# Patient Record
Sex: Female | Born: 1935
Health system: Southern US, Community
[De-identification: ages and names within clinical notes are randomized; demographics above are authoritative.]

## PROBLEM LIST (undated history)

## (undated) DIAGNOSIS — M1712 Unilateral primary osteoarthritis, left knee: Secondary | ICD-10-CM

## (undated) DIAGNOSIS — I10 Essential (primary) hypertension: Secondary | ICD-10-CM

## (undated) DIAGNOSIS — C50919 Malignant neoplasm of unspecified site of unspecified female breast: Secondary | ICD-10-CM

## (undated) DIAGNOSIS — M109 Gout, unspecified: Secondary | ICD-10-CM

## (undated) DIAGNOSIS — R0789 Other chest pain: Secondary | ICD-10-CM

## (undated) DIAGNOSIS — F419 Anxiety disorder, unspecified: Secondary | ICD-10-CM

## (undated) DIAGNOSIS — M1990 Unspecified osteoarthritis, unspecified site: Secondary | ICD-10-CM

## (undated) DIAGNOSIS — I639 Cerebral infarction, unspecified: Secondary | ICD-10-CM

## (undated) HISTORY — DX: Gout, unspecified: M10.9

## (undated) HISTORY — DX: Unspecified osteoarthritis, unspecified site: M19.90

## (undated) HISTORY — DX: Anxiety disorder, unspecified: F41.9

## (undated) HISTORY — PX: ABDOMINAL HYSTERECTOMY: SHX81

## (undated) HISTORY — DX: Other chest pain: R07.89

## (undated) HISTORY — DX: Essential (primary) hypertension: I10

## (undated) HISTORY — DX: Malignant neoplasm of unspecified site of unspecified female breast: C50.919

## (undated) HISTORY — PX: CHOLECYSTECTOMY: SHX55

---
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
## 2003-03-07 ENCOUNTER — Encounter: Payer: Self-pay | Admitting: Internal Medicine

## 2003-03-07 ENCOUNTER — Encounter: Admission: RE | Admit: 2003-03-07 | Discharge: 2003-03-07 | Payer: Self-pay | Admitting: Internal Medicine

## 2009-07-19 ENCOUNTER — Encounter: Admission: RE | Admit: 2009-07-19 | Discharge: 2009-07-19 | Payer: Self-pay | Admitting: Internal Medicine

## 2010-07-13 ENCOUNTER — Other Ambulatory Visit: Payer: Self-pay | Admitting: Internal Medicine

## 2010-07-13 DIAGNOSIS — Z1239 Encounter for other screening for malignant neoplasm of breast: Secondary | ICD-10-CM

## 2010-07-31 ENCOUNTER — Ambulatory Visit: Payer: Self-pay

## 2010-08-01 ENCOUNTER — Ambulatory Visit
Admission: RE | Admit: 2010-08-01 | Discharge: 2010-08-01 | Disposition: A | Discharge status: Home / Self Care | Payer: Medicare Other | Source: Ambulatory Visit | Attending: Internal Medicine | Admitting: Internal Medicine

## 2010-08-01 DIAGNOSIS — Z1239 Encounter for other screening for malignant neoplasm of breast: Secondary | ICD-10-CM

## 2011-07-12 ENCOUNTER — Other Ambulatory Visit: Payer: Self-pay | Admitting: Internal Medicine

## 2011-07-12 DIAGNOSIS — Z1231 Encounter for screening mammogram for malignant neoplasm of breast: Secondary | ICD-10-CM

## 2011-08-07 ENCOUNTER — Ambulatory Visit
Admission: RE | Admit: 2011-08-07 | Discharge: 2011-08-07 | Disposition: A | Discharge status: Home / Self Care | Payer: Medicare Other | Source: Ambulatory Visit | Attending: Internal Medicine | Admitting: Internal Medicine

## 2011-08-07 DIAGNOSIS — Z1231 Encounter for screening mammogram for malignant neoplasm of breast: Secondary | ICD-10-CM | POA: Diagnosis not present

## 2011-09-06 DIAGNOSIS — E559 Vitamin D deficiency, unspecified: Secondary | ICD-10-CM | POA: Diagnosis not present

## 2011-09-06 DIAGNOSIS — M2550 Pain in unspecified joint: Secondary | ICD-10-CM | POA: Diagnosis not present

## 2011-09-06 DIAGNOSIS — Z0000 Encounter for general adult medical examination without abnormal findings: Secondary | ICD-10-CM | POA: Diagnosis not present

## 2011-09-06 DIAGNOSIS — M545 Low back pain, unspecified: Secondary | ICD-10-CM | POA: Diagnosis not present

## 2011-09-06 DIAGNOSIS — Z23 Encounter for immunization: Secondary | ICD-10-CM | POA: Diagnosis not present

## 2011-09-06 DIAGNOSIS — J309 Allergic rhinitis, unspecified: Secondary | ICD-10-CM | POA: Diagnosis not present

## 2011-09-06 DIAGNOSIS — I10 Essential (primary) hypertension: Secondary | ICD-10-CM | POA: Diagnosis not present

## 2011-09-06 DIAGNOSIS — R252 Cramp and spasm: Secondary | ICD-10-CM | POA: Diagnosis not present

## 2012-01-17 DIAGNOSIS — H16229 Keratoconjunctivitis sicca, not specified as Sjogren's, unspecified eye: Secondary | ICD-10-CM | POA: Diagnosis not present

## 2012-01-17 DIAGNOSIS — H2510 Age-related nuclear cataract, unspecified eye: Secondary | ICD-10-CM | POA: Diagnosis not present

## 2012-02-15 ENCOUNTER — Encounter (HOSPITAL_BASED_OUTPATIENT_CLINIC_OR_DEPARTMENT_OTHER): Payer: Self-pay | Admitting: *Deleted

## 2012-02-15 ENCOUNTER — Emergency Department (HOSPITAL_BASED_OUTPATIENT_CLINIC_OR_DEPARTMENT_OTHER)
Admission: EM | Admit: 2012-02-15 | Discharge: 2012-02-16 | Disposition: A | Discharge status: Home / Self Care | Payer: Medicare Other | Attending: Emergency Medicine | Admitting: Emergency Medicine

## 2012-02-15 DIAGNOSIS — Z4801 Encounter for change or removal of surgical wound dressing: Secondary | ICD-10-CM | POA: Diagnosis not present

## 2012-02-15 DIAGNOSIS — M7989 Other specified soft tissue disorders: Secondary | ICD-10-CM | POA: Diagnosis not present

## 2012-02-15 DIAGNOSIS — M79676 Pain in unspecified toe(s): Secondary | ICD-10-CM

## 2012-02-15 DIAGNOSIS — M79609 Pain in unspecified limb: Secondary | ICD-10-CM | POA: Insufficient documentation

## 2012-02-15 DIAGNOSIS — I10 Essential (primary) hypertension: Secondary | ICD-10-CM | POA: Insufficient documentation

## 2012-02-15 DIAGNOSIS — Z8673 Personal history of transient ischemic attack (TIA), and cerebral infarction without residual deficits: Secondary | ICD-10-CM | POA: Insufficient documentation

## 2012-02-15 HISTORY — DX: Essential (primary) hypertension: I10

## 2012-02-15 HISTORY — DX: Cerebral infarction, unspecified: I63.9

## 2012-02-15 NOTE — ED Notes (Signed)
Right foot is painful and swollen. States she has been working in the yard for the past 2 days. Does not know if she got bit by an insect. Great toe is swollen, red with a large blister on this medial side of her foot.

## 2012-02-16 ENCOUNTER — Emergency Department (HOSPITAL_BASED_OUTPATIENT_CLINIC_OR_DEPARTMENT_OTHER): Payer: Medicare Other

## 2012-02-16 DIAGNOSIS — M79609 Pain in unspecified limb: Secondary | ICD-10-CM | POA: Diagnosis not present

## 2012-02-16 DIAGNOSIS — M7989 Other specified soft tissue disorders: Secondary | ICD-10-CM | POA: Diagnosis not present

## 2012-02-16 DIAGNOSIS — I10 Essential (primary) hypertension: Secondary | ICD-10-CM | POA: Diagnosis not present

## 2012-02-16 DIAGNOSIS — Z8673 Personal history of transient ischemic attack (TIA), and cerebral infarction without residual deficits: Secondary | ICD-10-CM | POA: Diagnosis not present

## 2012-02-16 LAB — CBC WITH DIFFERENTIAL/PLATELET
Basophils Absolute: 0.1 K/uL (ref 0.0–0.1)
Basophils Relative: 1 % (ref 0–1)
Eosinophils Absolute: 0.3 K/uL (ref 0.0–0.7)
Eosinophils Relative: 3 % (ref 0–5)
HCT: 39.5 % (ref 36.0–46.0)
Hemoglobin: 13.3 g/dL (ref 12.0–15.0)
Lymphocytes Relative: 28 % (ref 12–46)
Lymphs Abs: 2.6 K/uL (ref 0.7–4.0)
MCH: 30.9 pg (ref 26.0–34.0)
MCHC: 33.7 g/dL (ref 30.0–36.0)
MCV: 91.9 fL (ref 78.0–100.0)
Monocytes Absolute: 0.7 K/uL (ref 0.1–1.0)
Monocytes Relative: 8 % (ref 3–12)
Neutro Abs: 5.5 K/uL (ref 1.7–7.7)
Neutrophils Relative %: 60 % (ref 43–77)
Platelets: 300 K/uL (ref 150–400)
RBC: 4.3 MIL/uL (ref 3.87–5.11)
RDW: 12.5 % (ref 11.5–15.5)
WBC: 9.1 K/uL (ref 4.0–10.5)

## 2012-02-16 LAB — SEDIMENTATION RATE: Sed Rate: 25 mm/h — ABNORMAL HIGH (ref 0–22)

## 2012-02-16 LAB — URIC ACID: Uric Acid, Serum: 9.7 mg/dL — ABNORMAL HIGH (ref 2.4–7.0)

## 2012-02-16 LAB — BASIC METABOLIC PANEL WITH GFR
BUN: 27 mg/dL — ABNORMAL HIGH (ref 6–23)
CO2: 29 meq/L (ref 19–32)
Calcium: 10.1 mg/dL (ref 8.4–10.5)
Chloride: 98 meq/L (ref 96–112)
Creatinine, Ser: 1.2 mg/dL — ABNORMAL HIGH (ref 0.50–1.10)
GFR calc Af Amer: 51 mL/min — ABNORMAL LOW (ref >90)
GFR calc non Af Amer: 44 mL/min — ABNORMAL LOW (ref >90)
Glucose, Bld: 102 mg/dL — ABNORMAL HIGH (ref 70–99)
Potassium: 3.3 meq/L — ABNORMAL LOW (ref 3.5–5.1)
Sodium: 139 meq/L (ref 135–145)

## 2012-02-16 MED ORDER — VANCOMYCIN HCL IN DEXTROSE 1-5 GM/200ML-% IV SOLN
1000.0000 mg | Freq: Once | INTRAVENOUS | Status: AC
  Administered 2012-02-16: 1000 mg via INTRAVENOUS
  Filled 2012-02-16: qty 200

## 2012-02-16 MED ORDER — NAPROXEN 250 MG PO TABS
500.0000 mg | ORAL_TABLET | Freq: Once | ORAL | Status: AC
  Administered 2012-02-16: 500 mg via ORAL
  Filled 2012-02-16: qty 2

## 2012-02-16 MED ORDER — DOXYCYCLINE HYCLATE 100 MG PO CAPS: 100.0000 mg | ORAL_CAPSULE | Freq: Two times a day (BID) | ORAL | Status: AC

## 2012-02-16 MED ORDER — DOXYCYCLINE HYCLATE 100 MG PO TABS
100.0000 mg | ORAL_TABLET | Freq: Once | ORAL | Status: AC
  Administered 2012-02-16: 100 mg via ORAL
  Filled 2012-02-16: qty 1

## 2012-02-16 MED ORDER — SODIUM CHLORIDE 0.9 % IV SOLN
INTRAVENOUS | Status: DC
  Administered 2012-02-16: 01:00:00 via INTRAVENOUS

## 2012-02-16 MED ORDER — NAPROXEN SODIUM 220 MG PO TABS: ORAL_TABLET | ORAL | Status: DC

## 2012-02-16 MED ORDER — HYDROCODONE-ACETAMINOPHEN 5-325 MG PO TABS: 1.0000 | ORAL_TABLET | Freq: Four times a day (QID) | ORAL | Status: AC | PRN

## 2012-02-16 NOTE — ED Provider Notes (Addendum)
History     CSN: [id number]  Arrival date & time 02/15/12  2217   First MD Initiated Contact with Patient 02/16/12 0004      Chief Complaint  Patient presents with  . Foot Pain    (Consider location/radiation/quality/duration/timing/severity/associated sxs/prior treatment) HPI This is a 76 year old black female with pain, redness and swelling of the right great toe. This started yesterday morning and came on gradually. She denies injury. The pain and is in the right first  metatarsophalangeal joint and surrounding soft tissue. The swelling and erythema are on the dorsal aspect of the right first metatarsophalangeal joint and extending distally and proximally. The pain is moderate and worse with ambulation or palpation. She has no history of gout.  Past Medical History  Diagnosis Date  . Hypertension   . Stroke     Past Surgical History  Procedure Date  . Abdominal hysterectomy     No family history on file.  History  Substance Use Topics  . Smoking status: Never Smoker   . Smokeless tobacco: Not on file  . Alcohol Use: Yes    OB History    Grav Para Term Preterm Abortions TAB SAB Ect Mult Living                  Review of Systems  All other systems reviewed and are negative.    Allergies  Codeine  Home Medications   Current Outpatient Rx  Name Route Sig Dispense Refill  . ASPIRIN 81 MG PO TABS Oral Take 81 mg by mouth daily.    . ATENOLOL PO Oral Take by mouth.      BP 141/68  Pulse 63  Temp 98.6 F (37 C) (Oral)  Resp 20  SpO2 99%  Physical Exam General: Well-developed, well-nourished female in no acute distress; appearance consistent with age of record HENT: normocephalic, atraumatic Eyes: Normal appearance Neck: supple Heart: regular rate and rhythm Lungs: clear to auscultation bilaterally Abdomen: soft; nondistended Extremities: No deformity; erythema and swelling over dorsal aspect of right first metatarsophalangeal joint extending  several centimeters distally and proximally, there is associated warmth and mild to moderate tenderness with decreased range of motion of the right first toe; there is no erythema, warmth or swelling on the plantar aspect of the right first toe Neurologic: Awake, alert and oriented; motor function intact in all extremities and symmetric; no facial droop Skin: Warm and dry Psychiatric: Normal mood and affect    ED Course  Procedures (including critical care time)     MDM   Nursing notes and vitals signs, including pulse oximetry, reviewed.  Summary of this visit's results, reviewed by myself:  Labs:  Results for orders placed during the hospital encounter of 02/15/12  CBC WITH DIFFERENTIAL      Component Value Range   WBC 9.1  4.0 - 10.5 K/uL   RBC 4.30  3.87 - 5.11 MIL/uL   Hemoglobin 13.3  12.0 - 15.0 g/dL   HCT 78.2  95.6 - 21.3 %   MCV 91.9  78.0 - 100.0 fL   MCH 30.9  26.0 - 34.0 pg   MCHC 33.7  30.0 - 36.0 g/dL   RDW 08.6  57.8 - 46.9 %   Platelets 300  150 - 400 K/uL   Neutrophils Relative 60  43 - 77 %   Neutro Abs 5.5  1.7 - 7.7 K/uL   Lymphocytes Relative 28  12 - 46 %   Lymphs Abs 2.6  0.7 -  4.0 K/uL   Monocytes Relative 8  3 - 12 %   Monocytes Absolute 0.7  0.1 - 1.0 K/uL   Eosinophils Relative 3  0 - 5 %   Eosinophils Absolute 0.3  0.0 - 0.7 K/uL   Basophils Relative 1  0 - 1 %   Basophils Absolute 0.1  0.0 - 0.1 K/uL  BASIC METABOLIC PANEL      Component Value Range   Sodium 139  135 - 145 mEq/L   Potassium 3.3 (*) 3.5 - 5.1 mEq/L   Chloride 98  96 - 112 mEq/L   CO2 29  19 - 32 mEq/L   Glucose, Bld 102 (*) 70 - 99 mg/dL   BUN 27 (*) 6 - 23 mg/dL   Creatinine, Ser 9.14 (*) 0.50 - 1.10 mg/dL   Calcium 78.2  8.4 - 95.6 mg/dL   GFR calc non Af Amer 44 (*) >90 mL/min   GFR calc Af Amer 51 (*) >90 mL/min  URIC ACID      Component Value Range   Uric Acid, Serum 9.7 (*) 2.4 - 7.0 mg/dL  SEDIMENTATION RATE      Component Value Range   Sed Rate 25 (*) 0 -  22 mm/hr    Imaging Studies: Dg Toe Great Right  02/16/2012  *RADIOLOGY REPORT*  Clinical Data: Right first toe pain and swelling.  RIGHT GREAT TOE  Comparison: None  Findings: No acute fracture or dislocation.  Mild hallux valgus deformity present.  Soft tissue prominence adjacent to the MTP joint.  No foreign body.  IMPRESSION: No significant abnormalities.  Mild hallux valgus.   Original Report Authenticated By: Reola Calkins, M.D.    2:28 AM Vancomycin 1 g IV was given for possible cellulitis. The examination is also suspicious for gout especially in light of elevated uric acid. We will treat for both and have patient return in 24 hours for reevaluation. [place]; she was advised that if she does choose to leave town, which is not recommended, she had herself reexamined in emergency department there.           Hanley Seamen, MD 02/16/12 2130  Hanley Seamen, MD 02/16/12 0230

## 2012-02-17 ENCOUNTER — Encounter (HOSPITAL_BASED_OUTPATIENT_CLINIC_OR_DEPARTMENT_OTHER): Payer: Self-pay | Admitting: Emergency Medicine

## 2012-02-17 ENCOUNTER — Emergency Department (HOSPITAL_BASED_OUTPATIENT_CLINIC_OR_DEPARTMENT_OTHER)
Admission: EM | Admit: 2012-02-17 | Discharge: 2012-02-17 | Disposition: A | Discharge status: Home / Self Care | Payer: Medicare Other | Attending: Emergency Medicine | Admitting: Emergency Medicine

## 2012-02-17 DIAGNOSIS — L02619 Cutaneous abscess of unspecified foot: Secondary | ICD-10-CM | POA: Diagnosis not present

## 2012-02-17 DIAGNOSIS — I10 Essential (primary) hypertension: Secondary | ICD-10-CM | POA: Diagnosis not present

## 2012-02-17 DIAGNOSIS — L03039 Cellulitis of unspecified toe: Secondary | ICD-10-CM | POA: Insufficient documentation

## 2012-02-17 DIAGNOSIS — Z8673 Personal history of transient ischemic attack (TIA), and cerebral infarction without residual deficits: Secondary | ICD-10-CM | POA: Insufficient documentation

## 2012-02-17 DIAGNOSIS — Z4801 Encounter for change or removal of surgical wound dressing: Secondary | ICD-10-CM | POA: Diagnosis not present

## 2012-02-17 DIAGNOSIS — L0390 Cellulitis, unspecified: Secondary | ICD-10-CM

## 2012-02-17 NOTE — ED Provider Notes (Signed)
Medical screening examination/treatment/procedure(s) were performed by non-physician practitioner and as supervising physician I was immediately available for consultation/collaboration.   Jamilia Jacques, MD 02/17/12 1531 

## 2012-02-17 NOTE — ED Notes (Signed)
Pt here for recheck of toe.  Pt states it looks better and pain has improved.

## 2012-02-17 NOTE — ED Provider Notes (Signed)
History     CSN: [id number]  Arrival date & time 02/17/12  1226   First MD Initiated Contact with Patient 02/17/12 1328      Chief Complaint  Patient presents with  . Follow-up    (Consider location/radiation/quality/duration/timing/severity/associated sxs/prior treatment) Patient is a 76 y.o. female presenting with wound check. The history is provided by the patient. No language interpreter was used.  Wound Check  She was treated in the ED 2 to 3 days ago. Treatments since wound repair include oral antibiotics. The redness has improved. The swelling has improved. The pain has improved.  Pt here for recheck of cellulitis  Past Medical History  Diagnosis Date  . Hypertension   . Stroke     Past Surgical History  Procedure Date  . Abdominal hysterectomy     No family history on file.  History  Substance Use Topics  . Smoking status: Never Smoker   . Smokeless tobacco: Not on file  . Alcohol Use: Yes    OB History    Grav Para Term Preterm Abortions TAB SAB Ect Mult Living                  Review of Systems  Musculoskeletal: Negative for myalgias and joint swelling.  All other systems reviewed and are negative.    Allergies  Codeine  Home Medications   Current Outpatient Rx  Name Route Sig Dispense Refill  . ASPIRIN 81 MG PO TABS Oral Take 81 mg by mouth daily.    . ATENOLOL PO Oral Take by mouth.    . DOXYCYCLINE HYCLATE 100 MG PO CAPS Oral Take 1 capsule (100 mg total) by mouth 2 (two) times daily. 20 capsule 0  . HYDROCODONE-ACETAMINOPHEN 5-325 MG PO TABS Oral Take 1-2 tablets by mouth every 6 (six) hours as needed for pain. 20 tablet 0  . NAPROXEN SODIUM 220 MG PO TABS  Take 2 tablets every 12 hours for toe pain.      BP 105/58  Pulse 54  Temp 98.6 F (37 C) (Oral)  Resp 16  Ht 5' 1.5" (1.562 m)  Wt 159 lb (72.122 kg)  BMI 29.56 kg/m2  SpO2 99%  Physical Exam  Nursing note and vitals reviewed. Constitutional: She appears well-developed and  well-nourished.  Musculoskeletal: Normal range of motion.       No sign of infection  Neurological: She is alert.  Skin: Skin is warm.  Psychiatric: She has a normal mood and affect.    ED Course  Procedures (including critical care time)  Labs Reviewed - No data to display Dg Toe Great Right  02/16/2012  *RADIOLOGY REPORT*  Clinical Data: Right first toe pain and swelling.  RIGHT GREAT TOE  Comparison: None  Findings: No acute fracture or dislocation.  Mild hallux valgus deformity present.  Soft tissue prominence adjacent to the MTP joint.  No foreign body.  IMPRESSION: No significant abnormalities.  Mild hallux valgus.   Original Report Authenticated By: Reola Calkins, M.D.      1. Cellulitis       MDM  Pt advised finish antibiotic.         Lonia Skinner [place], [place] 02/17/12 1339

## 2012-03-13 DIAGNOSIS — N959 Unspecified menopausal and perimenopausal disorder: Secondary | ICD-10-CM | POA: Diagnosis not present

## 2012-03-13 DIAGNOSIS — M810 Age-related osteoporosis without current pathological fracture: Secondary | ICD-10-CM | POA: Diagnosis not present

## 2012-03-13 DIAGNOSIS — I10 Essential (primary) hypertension: Secondary | ICD-10-CM | POA: Diagnosis not present

## 2012-03-13 DIAGNOSIS — M25579 Pain in unspecified ankle and joints of unspecified foot: Secondary | ICD-10-CM | POA: Diagnosis not present

## 2012-03-13 DIAGNOSIS — Z79899 Other long term (current) drug therapy: Secondary | ICD-10-CM | POA: Diagnosis not present

## 2012-06-18 HISTORY — PX: BREAST LUMPECTOMY: SHX2

## 2012-06-27 DIAGNOSIS — Z79899 Other long term (current) drug therapy: Secondary | ICD-10-CM | POA: Diagnosis not present

## 2012-06-27 DIAGNOSIS — B372 Candidiasis of skin and nail: Secondary | ICD-10-CM | POA: Diagnosis not present

## 2012-06-27 DIAGNOSIS — M2550 Pain in unspecified joint: Secondary | ICD-10-CM | POA: Diagnosis not present

## 2012-07-10 ENCOUNTER — Other Ambulatory Visit: Payer: Self-pay | Admitting: Internal Medicine

## 2012-07-10 DIAGNOSIS — Z1231 Encounter for screening mammogram for malignant neoplasm of breast: Secondary | ICD-10-CM

## 2012-08-08 ENCOUNTER — Ambulatory Visit
Admission: RE | Admit: 2012-08-08 | Discharge: 2012-08-08 | Disposition: A | Discharge status: Home / Self Care | Payer: Medicare Other | Source: Ambulatory Visit | Attending: Internal Medicine | Admitting: Internal Medicine

## 2012-08-08 DIAGNOSIS — Z1231 Encounter for screening mammogram for malignant neoplasm of breast: Secondary | ICD-10-CM | POA: Diagnosis not present

## 2012-08-11 ENCOUNTER — Other Ambulatory Visit: Payer: Self-pay | Admitting: Internal Medicine

## 2012-08-11 DIAGNOSIS — R928 Other abnormal and inconclusive findings on diagnostic imaging of breast: Secondary | ICD-10-CM

## 2012-09-29 ENCOUNTER — Ambulatory Visit
Admission: RE | Admit: 2012-09-29 | Discharge: 2012-09-29 | Disposition: A | Discharge status: Home / Self Care | Payer: Medicare Other | Source: Ambulatory Visit | Attending: Internal Medicine | Admitting: Internal Medicine

## 2012-09-29 DIAGNOSIS — R928 Other abnormal and inconclusive findings on diagnostic imaging of breast: Secondary | ICD-10-CM | POA: Diagnosis not present

## 2012-10-01 DIAGNOSIS — F411 Generalized anxiety disorder: Secondary | ICD-10-CM | POA: Diagnosis not present

## 2012-10-01 DIAGNOSIS — M2550 Pain in unspecified joint: Secondary | ICD-10-CM | POA: Diagnosis not present

## 2012-10-01 DIAGNOSIS — E559 Vitamin D deficiency, unspecified: Secondary | ICD-10-CM | POA: Diagnosis not present

## 2012-10-01 DIAGNOSIS — I10 Essential (primary) hypertension: Secondary | ICD-10-CM | POA: Diagnosis not present

## 2012-10-01 DIAGNOSIS — Z79899 Other long term (current) drug therapy: Secondary | ICD-10-CM | POA: Diagnosis not present

## 2012-10-02 DIAGNOSIS — I10 Essential (primary) hypertension: Secondary | ICD-10-CM | POA: Diagnosis not present

## 2012-10-02 DIAGNOSIS — Z0000 Encounter for general adult medical examination without abnormal findings: Secondary | ICD-10-CM | POA: Diagnosis not present

## 2012-10-02 DIAGNOSIS — Z23 Encounter for immunization: Secondary | ICD-10-CM | POA: Diagnosis not present

## 2012-10-02 DIAGNOSIS — E559 Vitamin D deficiency, unspecified: Secondary | ICD-10-CM | POA: Diagnosis not present

## 2012-10-02 DIAGNOSIS — M2550 Pain in unspecified joint: Secondary | ICD-10-CM | POA: Diagnosis not present

## 2012-10-02 DIAGNOSIS — F411 Generalized anxiety disorder: Secondary | ICD-10-CM | POA: Diagnosis not present

## 2013-01-07 DIAGNOSIS — R079 Chest pain, unspecified: Secondary | ICD-10-CM | POA: Diagnosis not present

## 2013-01-07 DIAGNOSIS — N360 Urethral fistula: Secondary | ICD-10-CM | POA: Diagnosis not present

## 2013-01-07 DIAGNOSIS — N390 Urinary tract infection, site not specified: Secondary | ICD-10-CM | POA: Diagnosis not present

## 2013-01-07 DIAGNOSIS — M1990 Unspecified osteoarthritis, unspecified site: Secondary | ICD-10-CM | POA: Diagnosis not present

## 2013-02-26 ENCOUNTER — Other Ambulatory Visit: Payer: Self-pay | Admitting: Internal Medicine

## 2013-02-26 DIAGNOSIS — Z23 Encounter for immunization: Secondary | ICD-10-CM | POA: Diagnosis not present

## 2013-02-26 DIAGNOSIS — R921 Mammographic calcification found on diagnostic imaging of breast: Secondary | ICD-10-CM

## 2013-03-16 DIAGNOSIS — R059 Cough, unspecified: Secondary | ICD-10-CM | POA: Diagnosis not present

## 2013-03-16 DIAGNOSIS — R05 Cough: Secondary | ICD-10-CM | POA: Diagnosis not present

## 2013-03-16 DIAGNOSIS — J40 Bronchitis, not specified as acute or chronic: Secondary | ICD-10-CM | POA: Diagnosis not present

## 2013-03-16 DIAGNOSIS — M79609 Pain in unspecified limb: Secondary | ICD-10-CM | POA: Diagnosis not present

## 2013-03-25 DIAGNOSIS — I498 Other specified cardiac arrhythmias: Secondary | ICD-10-CM | POA: Diagnosis not present

## 2013-03-25 DIAGNOSIS — R0609 Other forms of dyspnea: Secondary | ICD-10-CM | POA: Diagnosis not present

## 2013-03-25 DIAGNOSIS — R05 Cough: Secondary | ICD-10-CM | POA: Diagnosis not present

## 2013-03-25 DIAGNOSIS — R059 Cough, unspecified: Secondary | ICD-10-CM | POA: Diagnosis not present

## 2013-03-25 DIAGNOSIS — R42 Dizziness and giddiness: Secondary | ICD-10-CM | POA: Diagnosis not present

## 2013-03-25 DIAGNOSIS — Z79899 Other long term (current) drug therapy: Secondary | ICD-10-CM | POA: Diagnosis not present

## 2013-03-31 DIAGNOSIS — M109 Gout, unspecified: Secondary | ICD-10-CM | POA: Diagnosis not present

## 2013-03-31 DIAGNOSIS — I10 Essential (primary) hypertension: Secondary | ICD-10-CM | POA: Diagnosis not present

## 2013-03-31 DIAGNOSIS — Z79899 Other long term (current) drug therapy: Secondary | ICD-10-CM | POA: Diagnosis not present

## 2013-04-01 ENCOUNTER — Other Ambulatory Visit: Payer: Self-pay | Admitting: Internal Medicine

## 2013-04-01 ENCOUNTER — Ambulatory Visit
Admission: RE | Admit: 2013-04-01 | Discharge: 2013-04-01 | Disposition: A | Discharge status: Home / Self Care | Payer: BC Managed Care – PPO | Source: Ambulatory Visit | Attending: Internal Medicine | Admitting: Internal Medicine

## 2013-04-01 DIAGNOSIS — R921 Mammographic calcification found on diagnostic imaging of breast: Secondary | ICD-10-CM

## 2013-04-01 DIAGNOSIS — R928 Other abnormal and inconclusive findings on diagnostic imaging of breast: Secondary | ICD-10-CM | POA: Diagnosis not present

## 2013-04-09 ENCOUNTER — Ambulatory Visit
Admission: RE | Admit: 2013-04-09 | Discharge: 2013-04-09 | Disposition: A | Discharge status: Home / Self Care | Payer: BC Managed Care – PPO | Source: Ambulatory Visit | Attending: Internal Medicine | Admitting: Internal Medicine

## 2013-04-09 ENCOUNTER — Other Ambulatory Visit: Payer: Self-pay | Admitting: Diagnostic Radiology

## 2013-04-09 DIAGNOSIS — R928 Other abnormal and inconclusive findings on diagnostic imaging of breast: Secondary | ICD-10-CM | POA: Diagnosis not present

## 2013-04-09 DIAGNOSIS — R921 Mammographic calcification found on diagnostic imaging of breast: Secondary | ICD-10-CM

## 2013-04-09 DIAGNOSIS — D0590 Unspecified type of carcinoma in situ of unspecified breast: Secondary | ICD-10-CM | POA: Diagnosis not present

## 2013-04-14 ENCOUNTER — Other Ambulatory Visit: Payer: Self-pay | Admitting: Internal Medicine

## 2013-04-14 DIAGNOSIS — R921 Mammographic calcification found on diagnostic imaging of breast: Secondary | ICD-10-CM

## 2013-04-14 DIAGNOSIS — D0511 Intraductal carcinoma in situ of right breast: Secondary | ICD-10-CM

## 2013-04-16 ENCOUNTER — Telehealth: Payer: Self-pay | Admitting: *Deleted

## 2013-04-16 ENCOUNTER — Ambulatory Visit
Admission: RE | Admit: 2013-04-16 | Discharge: 2013-04-16 | Disposition: A | Discharge status: Home / Self Care | Payer: Medicare Other | Source: Ambulatory Visit | Attending: Internal Medicine | Admitting: Internal Medicine

## 2013-04-16 DIAGNOSIS — R921 Mammographic calcification found on diagnostic imaging of breast: Secondary | ICD-10-CM

## 2013-04-16 DIAGNOSIS — C50211 Malignant neoplasm of upper-inner quadrant of right female breast: Secondary | ICD-10-CM | POA: Insufficient documentation

## 2013-04-16 NOTE — Telephone Encounter (Signed)
Confirmed [facility] for 04/22/13 at 1200.  Instructions and contact information given.

## 2013-04-17 ENCOUNTER — Ambulatory Visit
Admission: RE | Admit: 2013-04-17 | Discharge: 2013-04-17 | Disposition: A | Discharge status: Home / Self Care | Payer: Medicare Other | Source: Ambulatory Visit | Attending: Internal Medicine | Admitting: Internal Medicine

## 2013-04-17 DIAGNOSIS — C50919 Malignant neoplasm of unspecified site of unspecified female breast: Secondary | ICD-10-CM | POA: Diagnosis not present

## 2013-04-17 DIAGNOSIS — D0511 Intraductal carcinoma in situ of right breast: Secondary | ICD-10-CM

## 2013-04-17 MED ORDER — GADOBENATE DIMEGLUMINE 529 MG/ML IV SOLN
15.0000 mL | Freq: Once | INTRAVENOUS | Status: AC | PRN
  Administered 2013-04-17: 15 mL via INTRAVENOUS

## 2013-04-20 ENCOUNTER — Other Ambulatory Visit: Payer: Self-pay | Admitting: Internal Medicine

## 2013-04-20 DIAGNOSIS — R928 Other abnormal and inconclusive findings on diagnostic imaging of breast: Secondary | ICD-10-CM

## 2013-04-22 ENCOUNTER — Ambulatory Visit (HOSPITAL_BASED_OUTPATIENT_CLINIC_OR_DEPARTMENT_OTHER): Payer: Medicare Other | Admitting: Oncology

## 2013-04-22 ENCOUNTER — Ambulatory Visit (HOSPITAL_BASED_OUTPATIENT_CLINIC_OR_DEPARTMENT_OTHER): Payer: Medicare Other | Admitting: General Surgery

## 2013-04-22 ENCOUNTER — Ambulatory Visit: Payer: Medicare Other

## 2013-04-22 ENCOUNTER — Ambulatory Visit: Payer: Medicare Other | Admitting: Physical Therapy

## 2013-04-22 ENCOUNTER — Other Ambulatory Visit (HOSPITAL_BASED_OUTPATIENT_CLINIC_OR_DEPARTMENT_OTHER): Payer: Medicare Other | Admitting: Lab

## 2013-04-22 ENCOUNTER — Encounter: Payer: Self-pay | Admitting: *Deleted

## 2013-04-22 ENCOUNTER — Encounter: Payer: Self-pay | Admitting: Oncology

## 2013-04-22 ENCOUNTER — Ambulatory Visit
Admission: RE | Admit: 2013-04-22 | Discharge: 2013-04-22 | Disposition: A | Discharge status: Home / Self Care | Payer: Medicare Other | Source: Ambulatory Visit | Attending: Radiation Oncology | Admitting: Radiation Oncology

## 2013-04-22 VITALS — BP 157/71 | HR 55 | Temp 98.7°F | Resp 18 | Ht 61.5 in | Wt 183.2 lb

## 2013-04-22 DIAGNOSIS — C50219 Malignant neoplasm of upper-inner quadrant of unspecified female breast: Secondary | ICD-10-CM

## 2013-04-22 DIAGNOSIS — C50211 Malignant neoplasm of upper-inner quadrant of right female breast: Secondary | ICD-10-CM

## 2013-04-22 DIAGNOSIS — R160 Hepatomegaly, not elsewhere classified: Secondary | ICD-10-CM

## 2013-04-22 DIAGNOSIS — K769 Liver disease, unspecified: Secondary | ICD-10-CM | POA: Diagnosis not present

## 2013-04-22 LAB — CBC WITH DIFFERENTIAL/PLATELET
BASO%: 1.9 % (ref 0.0–2.0)
Basophils Absolute: 0.1 10*3/uL (ref 0.0–0.1)
EOS%: 2.5 % (ref 0.0–7.0)
Eosinophils Absolute: 0.2 10*3/uL (ref 0.0–0.5)
HCT: 35.9 % (ref 34.8–46.6)
HGB: 12 g/dL (ref 11.6–15.9)
MCH: 31 pg (ref 25.1–34.0)
MCHC: 33.5 g/dL (ref 31.5–36.0)
MCV: 92.6 fL (ref 79.5–101.0)
MONO#: 0.7 10*3/uL (ref 0.1–0.9)
MONO%: 9.7 % (ref 0.0–14.0)
NEUT%: 48.9 % (ref 38.4–76.8)
RDW: 13.6 % (ref 11.2–14.5)
WBC: 7 10*3/uL (ref 3.9–10.3)
lymph#: 2.6 10*3/uL (ref 0.9–3.3)

## 2013-04-22 LAB — COMPREHENSIVE METABOLIC PANEL (CC13)
ALT: 31 U/L (ref 0–55)
Anion Gap: 9 mEq/L (ref 3–11)
BUN: 28.1 mg/dL — ABNORMAL HIGH (ref 7.0–26.0)
CO2: 28 mEq/L (ref 22–29)
Creatinine: 1.2 mg/dL — ABNORMAL HIGH (ref 0.6–1.1)
Glucose: 88 mg/dl (ref 70–140)
Potassium: 4.3 mEq/L (ref 3.5–5.1)
Total Bilirubin: 0.47 mg/dL (ref 0.20–1.20)

## 2013-04-22 NOTE — Assessment & Plan Note (Addendum)
Because of the second area of concern found on the MRI, she is scheduled for an MR guided biopsy on November 12. However, even if this is positive for cancer, I think that she is a candidate for breast conservation due to the proximity of the lesions to one another.  I would not perform a lymph node biopsy on her and when she was found to have invasive cancer.  She also will need a needle localized excisional biopsy on the left. She had suspicious calcifications but these are unable to be biopsied due to the proximity of the calcifications to the skin. These are in the subareolar location.  I will go ahead and get the patient on the schedule for surgery. She wants to have this after Thanksgiving. I advised the patient that if we find out that she'll need a sentinel lymph node biopsy, I can see her back in the office to discuss this further.  Otherwise, our plan will be to do a right needle localized partial mastectomy and a left needle localized excisional breast biopsy. We did talk about the possibility of not performing surgery and not treating this. Advised patient that I would not elect to pursue this plan since she is a relatively healthy female and has a natural history of having a good life expectancy. I discussed that if it were to be left untreated, it will likely grow significantly and require more extensive surgery and/or chemotherapy or radiation to treat.  The surgical procedure was described to the patient.  I discussed the incision type and location and that we would need radiology involved on the day of surgery with a wire marker and possibly a sentinel node injection.  The risks and benefits of the procedure were described to the patient and he/she wishes to proceed.    We discussed the risks bleeding, infection, damage to other structures, need for further procedures/surgeries.  We discussed the risk of seroma.  The patient was advised if the area in the breast is invasive cancer, we  may need to go back to surgery for additional tissue to obtain negative margins or for a lymph node biopsy. The patient was advised that these are the most common complications, but that others can occur as well.  They were advised against taking aspirin or other anti-inflammatory agents/blood thinners the week before surgery.    45 min spent in evaluation, examination, counseling, and coordination of care.  >50% spent in counseling.

## 2013-04-22 NOTE — Progress Notes (Signed)
Checked in new patient with no financial issues. She has not been to [place] and I have her appt card and [facility] Form.

## 2013-04-22 NOTE — Progress Notes (Signed)
Chief complaint:  New right breast DCIS.    HISTORY: Is a 77 year old female who is referred by Dr. Renae Gloss for evaluation of her new right breast cancer. These were six-month followup for calcifications. The calcifications did change significantly and so she underwent diagnostic mammography and biopsy. 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  She subsequently underwent MRI and was found to have a second area that was 2.5 cm away that was concerning. This has not been biopsied yet.  She does not have any family history of breast cancer. She and her call when she menarche. She is not vomiting. Since she had her hysterectomy. She did have 5 children that she carried to term. She has not used hormone replacement were hormonal contraception. She has had a colonoscopy and bone density scan.  Past Medical History  Diagnosis Date  . Hypertension   . Stroke   . Breast cancer   . Anxiety   . Arthritis   . Gout     Past Surgical History  Procedure Laterality Date  . Abdominal hysterectomy    . Cholecystectomy      Current Outpatient Prescriptions  Medication Sig Dispense Refill  . aspirin 81 MG tablet Take 81 mg by mouth daily.      . naproxen sodium (ALEVE) 220 MG tablet Take 2 tablets every 12 hours for toe pain.       No current facility-administered medications for this visit.     Allergies  Allergen Reactions  . Codeine Nausea Only     No pertinent family history.     History   Social History  . Marital Status: Divorced    Spouse Name: N/A    Number of Children: N/A  . Years of Education: N/A   Social History Main Topics  . Smoking status: Never Smoker   . Smokeless tobacco: Not on file  . Alcohol Use: Yes  . Drug Use: No  . Sexual Activity: Not on file   Other Topics Concern  . Not on file   Social History Narrative  . Daughter is [employer] nurse.       REVIEW OF SYSTEMS - PERTINENT POSITIVES ONLY: 12 point review of systems negative  other than HPI and PMH except for sinus issues, dentures, history of ulcer, back pain, arthritis, gout, anxiety  EXAM:  Wt Readings from Last 3 Encounters:  04/22/13 183 lb 3.2 oz (83.099 kg)  02/17/12 159 lb (72.122 kg)   Temp Readings from Last 3 Encounters:  04/22/13 98.7 F (37.1 C) Oral  02/17/12 98.6 F (37 C) Oral  02/15/12 98.6 F (37 C) Oral   BP Readings from Last 3 Encounters:  04/22/13 157/71  02/17/12 105/58  02/16/12 142/59   Pulse Readings from Last 3 Encounters:  04/22/13 55  02/17/12 54  02/15/12 63     Gen:  No acute distress.  Well nourished and well groomed.   Neurological: Alert and oriented to person, place, and time. Coordination normal.  Head: Normocephalic and atraumatic.  Eyes: Conjunctivae are normal. Pupils are equal, round, and reactive to light. No scleral icterus.  Neck: Normal range of motion. Neck supple. No tracheal deviation or thyromegaly present.  Cardiovascular: Normal rate, regular rhythm, normal heart sounds and intact distal pulses.  Exam reveals no gallop and no friction rub.  No murmur heard. Respiratory: Effort normal.  No respiratory distress. No chest wall tenderness. Breath sounds normal.  No wheezes, rales or rhonchi.  Breast:  No palpable mass in either breast.  No nipple retraction or skin dimpling.  There is no nipple discharge, no lymphadenopathy.   GI: Soft. Bowel sounds are normal. The abdomen is soft and nontender.  There is no rebound and no guarding.  Musculoskeletal: Normal range of motion. Extremities are nontender.  Lymphadenopathy: No cervical, preauricular, postauricular or axillary adenopathy is present Skin: Skin is warm and dry. No rash noted. No diaphoresis. No erythema. No pallor. No clubbing, cyanosis, or edema.   Psychiatric: Normal mood and affect. Behavior is normal. Judgment and thought content normal.    LABORATORY RESULTS: Available labs are reviewed  Pathology Diagnosis Breast, right, needle core  biopsy, 1 o'clock - DUCTAL CARCINOMA IN SITU SEE COMMENT.Marland Kitchen ER/PR +  RADIOLOGY RESULTS: See E-Chart or I-Site for most recent results.  Images and reports are reviewed.  MR IMPRESSION:  1. Post biopsy changes in the upper inner quadrant of the right  breast.  2. 1.2 cm area of clumped linear enhancement in the upper inner  right breast, 2.5 cm anterior and inferior to the biopsy marker  clip. This is suspicious for a 2nd focus of ductal carcinoma in  situ. If breast conservation is desired, MRI guided core needle  biopsy of this area would be recommended.  3. No evidence of malignancy on the left and no adenopathy.  Mammo FINDINGS:  Routine a magnification views of both breasts are performed.  A 5 x 5 mm group of heterogeneous calcifications in the upper right  breast (middle 3rd) have increased in number, size and  heterogeneity. Slight increased density is associated with these  calcifications.  Other calcifications scattered within both breasts are unchanged.  Scarring within the right breast is again noted.  No other suspicious findings are identified.  Mammographic images were processed with CAD.  IMPRESSION:  Developing indeterminate calcifications within the upper right  breast -tissue sampling is recommended.  Other stable likely benign bilateral breast calcifications.  Six-month followup is recommended to ensure stability.    ASSESSMENT AND PLAN: Breast cancer of upper-inner quadrant of right female breast Because of the second area of concern found on the MRI, she is scheduled for an MR guided biopsy on November 12. However, even if this is positive for cancer, I think that she is a candidate for breast conservation due to the proximity of the lesions to one another.  I would not perform a lymph node biopsy on her and when she was found to have invasive cancer.  She also will need a needle localized excisional biopsy on the left. She had suspicious calcifications  but these are unable to be biopsied due to the proximity of the calcifications to the skin. These are in the subareolar location.  I will go ahead and get the patient on the schedule for surgery. She wants to have this after Thanksgiving. I advised the patient that if we find out that she'll need a sentinel lymph node biopsy, I can see her back in the office to discuss this further.  Otherwise, our plan will be to do a right needle localized partial mastectomy and a left needle localized excisional breast biopsy. We did talk about the possibility of not performing surgery and not treating this. Advised patient that I would not elect to pursue this plan since she is a relatively healthy female and has a natural history of having a good life expectancy. I discussed that if it were to be left untreated, it will likely grow significantly and  require more extensive surgery and/or chemotherapy or radiation to treat.  The surgical procedure was described to the patient.  I discussed the incision type and location and that we would need radiology involved on the day of surgery with a wire marker and possibly a sentinel node injection.  The risks and benefits of the procedure were described to the patient and he/she wishes to proceed.    We discussed the risks bleeding, infection, damage to other structures, need for further procedures/surgeries.  We discussed the risk of seroma.  The patient was advised if the area in the breast is invasive cancer, we may need to go back to surgery for additional tissue to obtain negative margins or for a lymph node biopsy. The patient was advised that these are the most common complications, but that others can occur as well.  They were advised against taking aspirin or other anti-inflammatory agents/blood thinners the week before surgery.    45 min spent in evaluation, examination, counseling, and coordination of care.  >50% spent in counseling.        Casey Diego  MD Surgical Oncology, General and Endocrine Surgery [facility]      Visit Diagnoses: 1. Mass of multiple sites of liver   2. Breast cancer of upper-inner quadrant of right female breast     Primary Care Physician: Alva Garnet., MD  [facility]  [facility]  Marianne Sofia, RN

## 2013-04-22 NOTE — Progress Notes (Signed)
[facility] NEW PATIENT EVALUATION  Name: Casey [facility] MRN: [id number]  Date:   04/22/2013           DOB: June 07, 1936  Status: outpatient   CC: Alva Garnet., MD  Almond Lint, MD    REFERRING PHYSICIAN: Almond Lint, MD   DIAGNOSIS: Stage 0 (Tis N0 M0) DCIS of the right breast   HISTORY OF PRESENT ILLNESS:  Casey Taylor is a 77 y.o. female who is seen today at the [facility] for the courtesy of Dr. Donell Beers for consideration of radiation therapy in the management of her DCIS of the right breast. At the time of a screening mammogram on 04/01/2013 she was noted to have a 0.5 x 0.5 cm area of calcifications within the upper right breast which had increased in number, size and heterogeneity. Additional views showed these to be at approximately 1:00 within the right breast. She was also found to have suspicious calcifications in the left subareolar location which were too superficial for a biopsy and surgical excision is recommended. Biopsy of the right breast on 04/09/2013 was diagnostic for DCIS, felt to be grade II. This was ER positive at 100% and PR positive at 86%. Breast MR on 04/17/2013 showed the biopsy site within the upper inner quadrant of the right breast, but also a separate area of enhancement measuring 1.2 cm  2.5 cm anterior and inferior to the biopsy marker clip. She is scheduled for a MRI guided biopsy of this on November 12. The left breast appear to be normal on MRI.  PREVIOUS RADIATION THERAPY: No   PAST MEDICAL HISTORY:  has a past medical history of Hypertension; Stroke; Breast cancer; Anxiety; Arthritis; and Gout.     PAST SURGICAL HISTORY:  Past Surgical History  Procedure Laterality Date  . Abdominal hysterectomy    . Cholecystectomy       FAMILY HISTORY: family history is not on file. No family history of cancer.   SOCIAL HISTORY:  reports that she has never smoked. She does not have any smokeless tobacco  history on file. She reports that she drinks alcohol. She reports that she does not use illicit drugs. Divorced, 5 children. She worked as a Chartered certified accountant.   ALLERGIES: Codeine   MEDICATIONS:  Current Outpatient Prescriptions  Medication Sig Dispense Refill  . aspirin 81 MG tablet Take 81 mg by mouth daily.      . naproxen sodium (ALEVE) 220 MG tablet Take 2 tablets every 12 hours for toe pain.       No current facility-administered medications for this encounter.     REVIEW OF SYSTEMS:  Pertinent items are noted in HPI.    PHYSICAL EXAM: Alert and oriented 77 year old African American female appearing younger than her stated age. Wt Readings from Last 3 Encounters:  04/22/13 183 lb 3.2 oz (83.099 kg)  02/17/12 159 lb (72.122 kg)   Temp Readings from Last 3 Encounters:  04/22/13 98.7 F (37.1 C) Oral  02/17/12 98.6 F (37 C) Oral  02/15/12 98.6 F (37 C) Oral   BP Readings from Last 3 Encounters:  04/22/13 157/71  02/17/12 105/58  02/16/12 142/59   Pulse Readings from Last 3 Encounters:  04/22/13 55  02/17/12 54  02/15/12 63   Head and neck examination: Grossly unremarkable. Nodes: Without palpable cervical, supraclavicular, or axillary lymphadenopathy. Chest: Lungs clear. Back: Without spinal or CVA tenderness. Heart: Regular rate and rhythm. Breasts: There is a biopsy wound along  the upper-outer quadrant of the right breast at 1:00. No masses are appreciated. Left breast also without masses or lesions. There is no left nipple discharge. Abdomen: Without hepatomegaly. Extremities: Without edema.    LABORATORY DATA:  Lab Results  Component Value Date   WBC 7.0 04/22/2013   HGB 12.0 04/22/2013   HCT 35.9 04/22/2013   MCV 92.6 04/22/2013   PLT 344 04/22/2013   Lab Results  Component Value Date   NA 141 04/22/2013   K 4.3 04/22/2013   CL 98 02/16/2012   CO2 28 04/22/2013   Lab Results  Component Value Date   ALT 31 04/22/2013   AST 26 04/22/2013    ALKPHOS 117 04/22/2013   BILITOT 0.47 04/22/2013      IMPRESSION: Stage 0 (Tis N0 M0) DCIS of the right breast. Next week she will have the MRI guided biopsy of the separate lesion seen within the right breast. In any case, she appears to be a candidate for breast preservation. Depending on her pathologic findings she may or may not require adjuvant radiation therapy. Adjuvant radiation therapy for DCIS is based on size, nuclear grade, surgical margins, and age. She may be a candidate for adjuvant hormone therapy as well or in place of radiation therapy. With respect to her left-sided calcifications, Dr. Donell Beers will perform an excisional biopsy. If this represents DCIS or invasive disease, she may be a candidate for breast preservation on the left as well. She appears to be a good performance status, and may have a 10 year life expectancy. She will be represented at the Wednesday morning conference following her surgery.   PLAN: As discussed above.  I spent 30 minutes minutes face to face with the patient and more than 50% of that time was spent in counseling and/or coordination of care.

## 2013-04-23 ENCOUNTER — Encounter: Payer: Self-pay | Admitting: Oncology

## 2013-04-23 NOTE — Progress Notes (Signed)
Casey Taylor [id number] April 10, 1936 77 y.o. 04/23/2013 5:40 PM  CC  Alva Garnet., MD [address] [place] [address]  Dr. Chipper Herb Dr. Everardo Beals REASON FOR CONSULTATION:  77 year old female with new diagnosis of right breast cancer. Patient is seen in the multidisciplinary breast clinic for discussion of treatment options  STAGE:   Breast cancer of upper-inner quadrant of right female breast   Primary site: Breast (Right)   Staging method: AJCC 7th Edition   Clinical: Stage 0 (Tis (DCIS), N0, cM0)   Summary: Stage 0 (Tis (DCIS), N0, cM0)  REFERRING PHYSICIAN: Dr. Everardo Beals  HISTORY OF PRESENT ILLNESS:  Casey Taylor is a 77 y.o. female.  Has been having screening mammograms on regular basis. She had a six-month followup mammogram that showed bilateral calcifications. On the right side she was noted to have 5 x 5 mm right calcifications. She was also had left calcifications suspicious but too close to the skin to biopsy. She underwent MRI of the breasts. She was noted to have biopsy changes as well as a 1.2 cm area of concern that was 2.5 cm anterior and inferior to the biopsy clip. On the left no lesions were seen. But it was felt that these were subareolar. 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 Patient was recommended to undergo biopsy of the second lesion in the right breast. This biopsy is scheduled on 04/29/2013. Patient's case was discussed at the multidisciplinary breast conference. Her radiology and pathology were reviewed. She is without any complaints related to her breast cancer.   Past Medical History: Past Medical History  Diagnosis Date  . Hypertension   . Stroke   . Breast cancer   . Anxiety   . Arthritis   . Gout     Past Surgical History: Past Surgical History  Procedure Laterality Date  . Abdominal hysterectomy    . Cholecystectomy      Family History: History  reviewed. No pertinent family history.  Social History History  Substance Use Topics  . Smoking status: Never Smoker   . Smokeless tobacco: Not on file  . Alcohol Use: Yes    Allergies: Allergies  Allergen Reactions  . Codeine Nausea Only    Current Medications: Current Outpatient Prescriptions  Medication Sig Dispense Refill  . aspirin 81 MG tablet Take 81 mg by mouth daily.      Marland Kitchen atenolol-chlorthalidone (TENORETIC) 50-25 MG per tablet Take 1 tablet by mouth daily.      . Cholecalciferol (VITAMIN D PO) Take 2,000 Int'l Units by mouth daily.      . colchicine 0.6 MG tablet Take 0.6 mg by mouth daily.      . Magnesium 250 MG TABS Take 1 tablet by mouth daily.      . MELOXICAM PO Take 1 capsule by mouth daily.      . naproxen sodium (ALEVE) 220 MG tablet Take 2 tablets every 12 hours for toe pain.       No current facility-administered medications for this visit.    OB/GYN History: Menarche at around 46 she is postmenopausal she said 5 live births. She has never been on hormone replacement therapy.  Fertility Discussion: Not applicable Prior History of Cancer: No  Health Maintenance:  Colonoscopy yes yes Bone Density yes Last PAP smear unknown  ECOG PERFORMANCE STATUS: 0 - Asymptomatic  Genetic Counseling/testing: No  REVIEW OF SYSTEMS:  Patient does have history of arthritis  with joint pain and back pain as well as anxiety. She does have some sinus issues that are ongoing. She also has history of ulcers. Remainder of the 14 point review of systems is negative and scans separately into the electronic medical record  PHYSICAL EXAMINATION: Blood pressure 157/71, pulse 55, temperature 98.7 F (37.1 C), temperature source Oral, resp. rate 18, height 5' 1.5" (1.562 m), weight 183 lb 3.2 oz (83.099 kg).  ZOX:WRUEA, healthy, no distress, well nourished and well developed SKIN: skin color, texture, turgor are normal HEAD: Normocephalic EYES: PERRLA, EOMI, Conjunctiva  are pink and non-injected EARS: External ears normal OROPHARYNX:no exudate, no erythema and lips, buccal mucosa, and tongue normal  NECK: no adenopathy LYMPH:  no palpable lymphadenopathy, no hepatosplenomegaly BREAST:left breast normal without mass, skin or nipple changes or axillary nodes, abnormal mass palpable in the right breast from the biopsy otherwise no masses or nipple discharge LUNGS: clear to auscultation and percussion HEART: regular rate & rhythm ABDOMEN:abdomen soft, non-tender, normal bowel sounds and no masses or organomegaly BACK: Back symmetric, no curvature., No CVA tenderness EXTREMITIES:no edema, no clubbing, no cyanosis  NEURO: alert & oriented x 3 with fluent speech, no focal motor/sensory deficits, gait normal, reflexes normal and symmetric     STUDIES/RESULTS: Mr Breast Bilateral W Wo Contrast  04/17/2013   CLINICAL DATA:  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  EXAM: MR BILATERAL BREAST WITHOUT AND WITH CONTRAST  LABS:  BUN and creatinine were obtained on site at [facility] at  [address]  Results:  BUN 24 mg/dL,  Creatinine 1.2 mg/dL.  TECHNIQUE: Multiplanar, multisequence MR images of both breasts were obtained prior to and following the intravenous administration of 15ml of MultiHance.  THREE-DIMENSIONAL MR IMAGE RENDERING ON INDEPENDENT WORKSTATION:  Three-dimensional MR images were rendered by post-processing of the original MR data on an independent workstation. The three-dimensional MR images were interpreted, and findings are reported in the following complete MRI report for this study.  COMPARISON:  Previous exams, including the recent mammograms and stereotactic guided core needle biopsy.  FINDINGS: Breast composition: b. Scattered fibroglandular tissue  Background parenchymal enhancement: Mild, nodular.  Right breast: Post biopsy tract and biopsy marker clip artifact in the upper inner quadrant of the right breast,  corresponding to the location of the recently biopsied ductal carcinoma in situ. 2.5 cm anterior and inferior to the biopsy marker clip, there is 1.2 x 0.6 x 0.5 cm area of clumped linear enhancement.  Left breast: No mass or abnormal enhancement.  Lymph nodes: No abnormal appearing lymph nodes.  Ancillary findings:  None.  IMPRESSION: 1. Post biopsy changes in the upper inner quadrant of the right breast. 2. 1.2 cm area of clumped linear enhancement in the upper inner right breast, 2.5 cm anterior and inferior to the biopsy marker clip. This is suspicious for a 2nd focus of ductal carcinoma in situ. If breast conservation is desired, MRI guided core needle biopsy of this area would be recommended. 3. No evidence of malignancy on the left and no adenopathy.  RECOMMENDATION: Right breast MR guided core needle biopsy. This will be scheduled.  BI-RADS CATEGORY  4: Suspicious abnormality - biopsy should be considered.   Electronically Signed   By: Gordan Payment M.D.   On: 04/17/2013 16:29   Mm Digital Diagnostic Bilat  04/01/2013   CLINICAL DATA:  77 year old female - followup bilateral breast calcifications  EXAM: DIGITAL DIAGNOSTIC  BILATERAL MAMMOGRAM with CAD  COMPARISON:  09/29/2012  and prior mammograms dating back to 07/19/2009  ACR Breast Density Category d: The breasts are extremely dense, which lowers the sensitivity of mammography.  FINDINGS: Routine a magnification views of both breasts are performed.  A 5 x 5 mm group of heterogeneous calcifications in the upper right breast (middle 3rd) have increased in number, size and heterogeneity. Slight increased density is associated with these calcifications.  Other calcifications scattered within both breasts are unchanged.  Scarring within the right breast is again noted.  No other suspicious findings are identified.  Mammographic images were processed with CAD.  IMPRESSION: Developing indeterminate calcifications within the upper right breast -tissue sampling  is recommended.  Other stable likely benign bilateral breast calcifications. Six-month followup is recommended to ensure stability.  RECOMMENDATION: Stereotactic guided right breast biopsy. These results were discussed with the patient and she agrees to proceed with stereotactic guided biopsy. As she would like to have her daughter present at her biopsy appointment, she will contact us to schedule the stereotactic biopsy after she checks her daughter's schedule.  Bilateral diagnostic mammograms with magnification views in 6 months to follow-up other likely benign breast calcifications.  I have discussed the findings and recommendations with the patient. Results were also provided in writing at the conclusion of the visit.  BI-RADS CATEGORY  4: Suspicious abnormality - biopsy should be considered.   Electronically Signed   By: Laveda Abbe M.D.   On: 04/01/2013 11:39   Mm Rt Breast Bx W Loc Dev 1st Lesion Image Bx Spec Stereo Guide  04/17/2013   ADDENDUM REPORT: 04/17/2013 18:25  ADDENDUM: Scheduled stereotactic biopsy of left subaerolar calcifications could not be performed due to nipple retraction and insufficient tissue thickness at the anticipated biopsy site both in the CC and ML planes. I discussed the need for tissue sampling with needle localization and surgical biopsy if MRI scheduled for 04/17/2013 does not show evidence for other suspicious sites in the left breast ammenable to MRI guided biopsy initially. The patient understood this recommendation.   Electronically Signed   By: Leda Gauze M.D.   On: 04/17/2013 18:25   04/17/2013   ADDENDUM REPORT: 04/17/2013 18:23  ADDENDUM: Scheduled stereotactic biopsy of left subaerolar calcifications could not be performed due to nipple retraction and insufficient tissue thickness at the anticipated biopsy site both in the CC and ML planes. I discussed the need for tissue sampling with needle localization and surgical biopsy if MRI scheduled for 04/17/2013 does  not show evidence for other suspicious sites in the left breast ammenable to MRI guided biopsy initially. The patient understood this recommendation.   Electronically Signed   By: Leda Gauze M.D.   On: 04/17/2013 18:23   04/15/2013   ADDENDUM REPORT: 04/15/2013 10:58  ADDENDUM: Ductal carcinoma in situ was reported histologically from the calcifications biopsied in the right breast. This is concordant with the imaging findings. MRI has been scheduled on 04/17/2013. The patient will be seen at the Multidisciplinary Clinic on 04/22/2013. The patient was contacted and given the results of the biopsy. She states the biopsy site is clean and dry with no hematoma or signs of infection. Stereotactic core biopsy of calcifications in the left subareolar region is recommended. This has been scheduled for 04/16/2013.   Electronically Signed   By: Baird Lyons M.D.   On: 04/15/2013 10:58   04/17/2013   CLINICAL DATA:  Suspicious right breast calcifications.  EXAM: STEREOTACTIC VACUUM ASSIST RIGHT  COMPARISON:  Previous exams.  FINDINGS: I met  with the patient and we discussed the procedure of stereotactic-guided biopsy, including benefits and alternatives. We discussed the high likelihood of a successful procedure. We discussed the risks of the procedure, including infection, bleeding, tissue injury, clip migration, and inadequate sampling. Informed, written consent was given.  Using sterile technique and 2% Lidocaine as local anesthetic, under stereotactic guidance, a 9 gauge vacuum assisted device was used to perform core needle biopsy of calcifications within the right breast 1 o'clock position middle depth using a superior approach. Specimen radiograph was performed, showing calcifications. Specimens with calcifications are identified for pathology.  At the conclusion of the procedure, a top hat shaped tissue marker clip was deployed into the biopsy cavity. Follow-up 2-view mammogram confirmed clip in appropriate  position.  IMPRESSION: Stereotactic-guided biopsy of right breast calcifications. No apparent complications.  Electronically Signed: By: Annia Belt M.D. On: 04/09/2013 11:50     LABS:    Chemistry      Component Value Date/Time   NA 141 04/22/2013 1220   NA 139 02/16/2012 0032   K 4.3 04/22/2013 1220   K 3.3* 02/16/2012 0032   CL 98 02/16/2012 0032   CO2 28 04/22/2013 1220   CO2 29 02/16/2012 0032   BUN 28.1* 04/22/2013 1220   BUN 27* 02/16/2012 0032   CREATININE 1.2* 04/22/2013 1220   CREATININE 1.20* 02/16/2012 0032      Component Value Date/Time   CALCIUM 10.4 04/22/2013 1220   CALCIUM 10.1 02/16/2012 0032   ALKPHOS 117 04/22/2013 1220   AST 26 04/22/2013 1220   ALT 31 04/22/2013 1220   BILITOT 0.47 04/22/2013 1220      Lab Results  Component Value Date   WBC 7.0 04/22/2013   HGB 12.0 04/22/2013   HCT 35.9 04/22/2013   MCV 92.6 04/22/2013   PLT 344 04/22/2013   PATHOLOGY: ADDITIONAL INFORMATION: PROGNOSTIC INDICATORS - ACIS Results: IMMUNOHISTOCHEMICAL AND MORPHOMETRIC ANALYSIS BY THE AUTOMATED CELLULAR IMAGING SYSTEM (ACIS) Estrogen Receptor: 100%, POSITIVE, STRONG STAINING INTENSITY Progesterone Receptor: 86%, POSITIVE, MODERATE STAINING INTENSITY REFERENCE RANGE ESTROGEN RECEPTOR NEGATIVE <1% POSITIVE =>1% PROGESTERONE RECEPTOR NEGATIVE <1% POSITIVE =>1% All controls stained appropriately Jimmy Picket MD Pathologist, Electronic Signature ( Signed 04/15/2013) FINAL DIAGNOSIS Diagnosis Breast, right, needle core biopsy, 1 o'clock - DUCTAL CARCINOMA IN SITU SEE COMMENT.. Microscopic Comment With these biopsies the in situ carcinoma is Grade II. Although there is angulation to the glands on the H&E slides, the corresponding slides taken for immunostain evaluation (smooth muscle myosin heavy chain, p63, and calponin) demonstrate preservation of the myoepithelial layer. Breast prognostic studies are pending and 1 of 2 FINAL for Shults, [place]  [phone]) Microscopic Comment(continued) will be reported in an addendum. E-cadherin stain demonstrates strong diffuse expression; supporting ductal origin for the in situ carcinoma. 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 Patient also has an incidental finding of another lesion measuring 1.2 cm in the right breast. This will need to be biopsied and she is scheduled for MRI biopsy on 04/29/2013. Patient also was noted to have calcifications in the subareolar region of the left breast which could not be biopsied and will need needle localization and lumpectomy.  #2 patient and I discussed her diagnosis, we discussed pathology, we'll also discuss pathophysiology and biology of breast cancers. We discussed treatment options including surgical, radiation therapy and adjuvant antiestrogen therapy. We discussed side effects from antiestrogen therapy.  We discussed the different types of treatments available including tamoxifen versus aromatase inhibitors. Discussed potential side effects of these.  Clinical Trial Eligibility: No Multidisciplinary conference discussion yes    PLAN:    #1 patient will proceed with MRI guided biopsy of the right breast.  #2 she will need a] lumpectomy with left needle localization.  #3 patient will be a candidate for antiestrogen therapy and we will discuss this further after her surgery.  #4 patient also will need radiation post lumpectomy she was seen by Dr. Dayton Scrape.       Discussion: Patient is being treated per NCCN breast cancer care guidelines appropriate for stage.0   Thank you so much for allowing me to participate in the care of JANAIA KOZEL. I will continue to follow up the patient with you and assist in her care.  All questions were answered. The patient knows to call the clinic with any problems,  questions or concerns. We can certainly see the patient much sooner if necessary.  I spent 40 minutes counseling the patient face to face. The total time spent in the appointment was 55 minutes.  Drue Second, MD Medical/Oncology [facility] [phone] (beeper) [phone] (Office)  04/23/2013, 5:40 PM

## 2013-04-24 ENCOUNTER — Other Ambulatory Visit (INDEPENDENT_AMBULATORY_CARE_PROVIDER_SITE_OTHER): Payer: Self-pay | Admitting: General Surgery

## 2013-04-24 ENCOUNTER — Telehealth: Payer: Self-pay | Admitting: Oncology

## 2013-04-24 DIAGNOSIS — C50911 Malignant neoplasm of unspecified site of right female breast: Secondary | ICD-10-CM

## 2013-04-24 NOTE — Telephone Encounter (Signed)
, °

## 2013-04-28 ENCOUNTER — Telehealth: Payer: Self-pay | Admitting: *Deleted

## 2013-04-28 NOTE — Telephone Encounter (Signed)
Spoke to pt concerning BMDC from 04/22/13.  Pt denies questions or concerns regarding dx or treatment care plan.  Confirmed surgery date and f/u appt with Dr. Welton Flakes.  Encourage pt to call with further needs.  Received verbal understanding.  Contact information given.

## 2013-04-29 ENCOUNTER — Ambulatory Visit
Admission: RE | Admit: 2013-04-29 | Discharge: 2013-04-29 | Disposition: A | Discharge status: Home / Self Care | Payer: Medicare Other | Source: Ambulatory Visit | Attending: Internal Medicine | Admitting: Internal Medicine

## 2013-04-29 ENCOUNTER — Other Ambulatory Visit (HOSPITAL_COMMUNITY): Payer: Self-pay | Admitting: Diagnostic Radiology

## 2013-04-29 DIAGNOSIS — D0590 Unspecified type of carcinoma in situ of unspecified breast: Secondary | ICD-10-CM | POA: Diagnosis not present

## 2013-04-29 DIAGNOSIS — R928 Other abnormal and inconclusive findings on diagnostic imaging of breast: Secondary | ICD-10-CM

## 2013-04-29 DIAGNOSIS — C50919 Malignant neoplasm of unspecified site of unspecified female breast: Secondary | ICD-10-CM | POA: Diagnosis not present

## 2013-04-29 DIAGNOSIS — N6489 Other specified disorders of breast: Secondary | ICD-10-CM | POA: Diagnosis not present

## 2013-04-29 MED ORDER — GADOBENATE DIMEGLUMINE 529 MG/ML IV SOLN
15.0000 mL | Freq: Once | INTRAVENOUS | Status: AC | PRN
  Administered 2013-04-29: 15 mL via INTRAVENOUS

## 2013-05-04 ENCOUNTER — Encounter: Payer: Self-pay | Admitting: *Deleted

## 2013-05-04 NOTE — Progress Notes (Signed)
Mailed after appt letter to pt. 

## 2013-05-12 ENCOUNTER — Encounter (HOSPITAL_COMMUNITY): Payer: Self-pay

## 2013-05-19 ENCOUNTER — Encounter (HOSPITAL_COMMUNITY)
Admission: RE | Admit: 2013-05-19 | Discharge: 2013-05-19 | Disposition: A | Discharge status: Home / Self Care | Payer: Medicare Other | Source: Ambulatory Visit | Attending: General Surgery | Admitting: General Surgery

## 2013-05-19 ENCOUNTER — Encounter (HOSPITAL_COMMUNITY): Admission: RE | Admit: 2013-05-19 | Payer: Medicare Other | Source: Ambulatory Visit

## 2013-05-19 ENCOUNTER — Encounter (HOSPITAL_COMMUNITY): Payer: Self-pay

## 2013-05-19 DIAGNOSIS — Z01812 Encounter for preprocedural laboratory examination: Secondary | ICD-10-CM | POA: Diagnosis not present

## 2013-05-19 DIAGNOSIS — N63 Unspecified lump in unspecified breast: Secondary | ICD-10-CM | POA: Diagnosis not present

## 2013-05-19 DIAGNOSIS — R918 Other nonspecific abnormal finding of lung field: Secondary | ICD-10-CM | POA: Diagnosis not present

## 2013-05-19 DIAGNOSIS — M109 Gout, unspecified: Secondary | ICD-10-CM | POA: Diagnosis not present

## 2013-05-19 DIAGNOSIS — I10 Essential (primary) hypertension: Secondary | ICD-10-CM | POA: Diagnosis not present

## 2013-05-19 DIAGNOSIS — Z01818 Encounter for other preprocedural examination: Secondary | ICD-10-CM | POA: Diagnosis not present

## 2013-05-19 DIAGNOSIS — Z79899 Other long term (current) drug therapy: Secondary | ICD-10-CM | POA: Diagnosis not present

## 2013-05-19 LAB — CBC
HCT: 38.4 % (ref 36.0–46.0)
Hemoglobin: 12.7 g/dL (ref 12.0–15.0)
MCHC: 33.1 g/dL (ref 30.0–36.0)
RDW: 13.3 % (ref 11.5–15.5)
WBC: 6.8 10*3/uL (ref 4.0–10.5)

## 2013-05-19 LAB — BASIC METABOLIC PANEL
BUN: 24 mg/dL — ABNORMAL HIGH (ref 6–23)
Calcium: 9.8 mg/dL (ref 8.4–10.5)
Chloride: 102 mEq/L (ref 96–112)
Creatinine, Ser: 1.27 mg/dL — ABNORMAL HIGH (ref 0.50–1.10)
GFR calc Af Amer: 47 mL/min — ABNORMAL LOW (ref >90)

## 2013-05-19 NOTE — Pre-Procedure Instructions (Signed)
Kelsa Jaworowski Denzler  05/19/2013   Your procedure is scheduled on:  05/26/13  Report to [facility]  2 * 3 at 11 AM.  Call this number if you have problems the morning of surgery: [phone]   Remember:   Do not eat food or drink liquids after midnight.   Take these medicines the morning of surgery with A SIP OF WATER: xanax,atenolol,colchine   Do not wear jewelry, make-up or nail polish.  Do not wear lotions, powders, or perfumes. You may wear deodorant.  Do not shave 48 hours prior to surgery. Men may shave face and neck.  Do not bring valuables to the hospital.  [facility] is not responsible                  for any belongings or valuables.               Contacts, dentures or bridgework may not be worn into surgery.  Leave suitcase in the car. After surgery it may be brought to your room.  For patients admitted to the hospital, discharge time is determined by your                treatment team.               Patients discharged the day of surgery will not be allowed to drive  home.  Name and phone number of your driver: family  Special Instructions: Shower using CHG 2 nights before surgery and the night before surgery.  If you shower the day of surgery use CHG.  Use special wash - you have one bottle of CHG for all showers.  You should use approximately 1/3 of the bottle for each shower.   Please read over the following fact sheets that you were given: Pain Booklet, Coughing and Deep Breathing and Surgical Site Infection Prevention

## 2013-05-20 DIAGNOSIS — M109 Gout, unspecified: Secondary | ICD-10-CM | POA: Diagnosis not present

## 2013-05-20 DIAGNOSIS — Z006 Encounter for examination for normal comparison and control in clinical research program: Secondary | ICD-10-CM | POA: Diagnosis not present

## 2013-05-20 DIAGNOSIS — I10 Essential (primary) hypertension: Secondary | ICD-10-CM | POA: Diagnosis not present

## 2013-05-20 DIAGNOSIS — Z79899 Other long term (current) drug therapy: Secondary | ICD-10-CM | POA: Diagnosis not present

## 2013-05-22 NOTE — Progress Notes (Signed)
rerequested EKG from Dr.Shelton [phone]

## 2013-05-25 ENCOUNTER — Other Ambulatory Visit (INDEPENDENT_AMBULATORY_CARE_PROVIDER_SITE_OTHER): Payer: Self-pay | Admitting: General Surgery

## 2013-05-25 DIAGNOSIS — R928 Other abnormal and inconclusive findings on diagnostic imaging of breast: Secondary | ICD-10-CM

## 2013-05-26 ENCOUNTER — Encounter (HOSPITAL_COMMUNITY)
Admission: RE | Disposition: A | Discharge status: Home / Self Care | Payer: Self-pay | Source: Ambulatory Visit | Attending: General Surgery

## 2013-05-26 ENCOUNTER — Ambulatory Visit
Admission: RE | Admit: 2013-05-26 | Discharge: 2013-05-26 | Disposition: A | Discharge status: Home / Self Care | Payer: Medicare Other | Source: Ambulatory Visit | Attending: General Surgery | Admitting: General Surgery

## 2013-05-26 ENCOUNTER — Other Ambulatory Visit (INDEPENDENT_AMBULATORY_CARE_PROVIDER_SITE_OTHER): Payer: Self-pay | Admitting: General Surgery

## 2013-05-26 ENCOUNTER — Ambulatory Visit (HOSPITAL_COMMUNITY)
Admission: RE | Admit: 2013-05-26 | Discharge: 2013-05-26 | Disposition: A | Discharge status: Home / Self Care | Payer: Medicare Other | Source: Ambulatory Visit | Attending: General Surgery | Admitting: General Surgery

## 2013-05-26 ENCOUNTER — Ambulatory Visit (HOSPITAL_COMMUNITY): Payer: Medicare Other | Admitting: Certified Registered"

## 2013-05-26 ENCOUNTER — Encounter (HOSPITAL_COMMUNITY): Payer: Self-pay | Admitting: Certified Registered"

## 2013-05-26 ENCOUNTER — Encounter (HOSPITAL_COMMUNITY): Payer: Medicare Other | Admitting: Certified Registered"

## 2013-05-26 DIAGNOSIS — F411 Generalized anxiety disorder: Secondary | ICD-10-CM | POA: Diagnosis not present

## 2013-05-26 DIAGNOSIS — K219 Gastro-esophageal reflux disease without esophagitis: Secondary | ICD-10-CM | POA: Diagnosis not present

## 2013-05-26 DIAGNOSIS — R920 Mammographic microcalcification found on diagnostic imaging of breast: Secondary | ICD-10-CM | POA: Diagnosis not present

## 2013-05-26 DIAGNOSIS — I10 Essential (primary) hypertension: Secondary | ICD-10-CM | POA: Diagnosis not present

## 2013-05-26 DIAGNOSIS — Z8673 Personal history of transient ischemic attack (TIA), and cerebral infarction without residual deficits: Secondary | ICD-10-CM | POA: Insufficient documentation

## 2013-05-26 DIAGNOSIS — C50911 Malignant neoplasm of unspecified site of right female breast: Secondary | ICD-10-CM

## 2013-05-26 DIAGNOSIS — D249 Benign neoplasm of unspecified breast: Secondary | ICD-10-CM | POA: Diagnosis not present

## 2013-05-26 DIAGNOSIS — M109 Gout, unspecified: Secondary | ICD-10-CM | POA: Diagnosis not present

## 2013-05-26 DIAGNOSIS — R928 Other abnormal and inconclusive findings on diagnostic imaging of breast: Secondary | ICD-10-CM | POA: Insufficient documentation

## 2013-05-26 DIAGNOSIS — D0590 Unspecified type of carcinoma in situ of unspecified breast: Secondary | ICD-10-CM | POA: Insufficient documentation

## 2013-05-26 DIAGNOSIS — Z853 Personal history of malignant neoplasm of breast: Secondary | ICD-10-CM | POA: Diagnosis not present

## 2013-05-26 HISTORY — PX: BREAST BIOPSY: SHX20

## 2013-05-26 HISTORY — PX: PARTIAL MASTECTOMY WITH NEEDLE LOCALIZATION: SHX6008

## 2013-05-26 SURGERY — BREAST BIOPSY WITH NEEDLE LOCALIZATION
Anesthesia: General | Laterality: Right

## 2013-05-26 MED ORDER — PROPOFOL 10 MG/ML IV BOLUS
INTRAVENOUS | Status: DC | PRN
  Administered 2013-05-26: 150 mg via INTRAVENOUS

## 2013-05-26 MED ORDER — HYDROMORPHONE HCL PF 1 MG/ML IJ SOLN
0.2500 mg | INTRAMUSCULAR | Status: DC | PRN
  Administered 2013-05-26: 0.25 mg via INTRAVENOUS

## 2013-05-26 MED ORDER — PROMETHAZINE HCL 25 MG/ML IJ SOLN: 6.2500 mg | INTRAMUSCULAR | Status: DC | PRN

## 2013-05-26 MED ORDER — CEFAZOLIN SODIUM-DEXTROSE 2-3 GM-% IV SOLR
2.0000 g | INTRAVENOUS | Status: DC
  Filled 2013-05-26: qty 50

## 2013-05-26 MED ORDER — LACTATED RINGERS IV SOLN
INTRAVENOUS | Status: DC | PRN
  Administered 2013-05-26: 13:00:00 via INTRAVENOUS

## 2013-05-26 MED ORDER — OXYCODONE HCL 5 MG PO TABS
ORAL_TABLET | ORAL | Status: AC
  Filled 2013-05-26: qty 1

## 2013-05-26 MED ORDER — LIDOCAINE HCL 1 % IJ SOLN
INTRAMUSCULAR | Status: DC | PRN
  Administered 2013-05-26: 15:00:00 via SUBCUTANEOUS

## 2013-05-26 MED ORDER — HYDROCODONE-ACETAMINOPHEN 5-325 MG PO TABS: 1.0000 | ORAL_TABLET | ORAL | Status: DC | PRN

## 2013-05-26 MED ORDER — OXYCODONE HCL 5 MG PO TABS
5.0000 mg | ORAL_TABLET | Freq: Once | ORAL | Status: AC | PRN
  Administered 2013-05-26: 5 mg via ORAL

## 2013-05-26 MED ORDER — FENTANYL CITRATE 0.05 MG/ML IJ SOLN
INTRAMUSCULAR | Status: DC | PRN
  Administered 2013-05-26 (×2): 50 ug via INTRAVENOUS

## 2013-05-26 MED ORDER — OXYCODONE HCL 5 MG/5ML PO SOLN: 5.0000 mg | Freq: Once | ORAL | Status: AC | PRN

## 2013-05-26 MED ORDER — ONDANSETRON HCL 4 MG/2ML IJ SOLN
INTRAMUSCULAR | Status: DC | PRN
  Administered 2013-05-26: 4 mg via INTRAVENOUS

## 2013-05-26 MED ORDER — SODIUM CHLORIDE 0.9 % IV SOLN
10.0000 mg | INTRAVENOUS | Status: DC | PRN
  Administered 2013-05-26: 5 ug/min via INTRAVENOUS

## 2013-05-26 MED ORDER — HYDROMORPHONE HCL PF 1 MG/ML IJ SOLN
INTRAMUSCULAR | Status: AC
  Filled 2013-05-26: qty 1

## 2013-05-26 MED ORDER — CEFAZOLIN SODIUM-DEXTROSE 2-3 GM-% IV SOLR
INTRAVENOUS | Status: DC | PRN
  Administered 2013-05-26: 2 g via INTRAVENOUS

## 2013-05-26 MED ORDER — LACTATED RINGERS IV SOLN
INTRAVENOUS | Status: DC
  Administered 2013-05-26: 14:00:00 via INTRAVENOUS

## 2013-05-26 MED ORDER — LIDOCAINE HCL (CARDIAC) 20 MG/ML IV SOLN
INTRAVENOUS | Status: DC | PRN
  Administered 2013-05-26: 60 mg via INTRAVENOUS

## 2013-05-26 MED ORDER — MIDAZOLAM HCL 5 MG/5ML IJ SOLN
INTRAMUSCULAR | Status: DC | PRN
  Administered 2013-05-26 (×2): 1 mg via INTRAVENOUS

## 2013-05-26 SURGICAL SUPPLY — 52 items
APPLIER CLIP 9.375 MED OPEN (MISCELLANEOUS)
BINDER BREAST LRG (GAUZE/BANDAGES/DRESSINGS) ×3 IMPLANT
BINDER BREAST XLRG (GAUZE/BANDAGES/DRESSINGS) IMPLANT
BLADE SURG 10 STRL SS (BLADE) ×3 IMPLANT
BLADE SURG 15 STRL LF DISP TIS (BLADE) ×2 IMPLANT
BLADE SURG 15 STRL SS (BLADE) ×1
CANISTER SUCTION 2500CC (MISCELLANEOUS) ×3 IMPLANT
CHLORAPREP W/TINT 26ML (MISCELLANEOUS) ×3 IMPLANT
CLIP APPLIE 9.375 MED OPEN (MISCELLANEOUS) IMPLANT
CLIP TI LARGE 6 (CLIP) ×3 IMPLANT
COVER SURGICAL LIGHT HANDLE (MISCELLANEOUS) ×3 IMPLANT
DECANTER SPIKE VIAL GLASS SM (MISCELLANEOUS) ×3 IMPLANT
DERMABOND ADVANCED (GAUZE/BANDAGES/DRESSINGS) ×1
DERMABOND ADVANCED .7 DNX12 (GAUZE/BANDAGES/DRESSINGS) ×2 IMPLANT
DEVICE DUBIN SPECIMEN MAMMOGRA (MISCELLANEOUS) ×3 IMPLANT
DRAPE CHEST BREAST 15X10 FENES (DRAPES) ×3 IMPLANT
DRAPE UTILITY 15X26 W/TAPE STR (DRAPE) ×6 IMPLANT
DRSG PAD ABDOMINAL 8X10 ST (GAUZE/BANDAGES/DRESSINGS) ×3 IMPLANT
ELECT CAUTERY BLADE 6.4 (BLADE) ×3 IMPLANT
ELECT REM PT RETURN 9FT ADLT (ELECTROSURGICAL) ×3
ELECTRODE REM PT RTRN 9FT ADLT (ELECTROSURGICAL) ×2 IMPLANT
GLOVE BIO SURGEON STRL SZ 6 (GLOVE) ×3 IMPLANT
GLOVE BIOGEL PI IND STRL 6.5 (GLOVE) ×2 IMPLANT
GLOVE BIOGEL PI INDICATOR 6.5 (GLOVE) ×1
GOWN PREVENTION PLUS XXLARGE (GOWN DISPOSABLE) ×3 IMPLANT
GOWN STRL NON-REIN LRG LVL3 (GOWN DISPOSABLE) ×3 IMPLANT
KIT BASIN OR (CUSTOM PROCEDURE TRAY) ×3 IMPLANT
KIT MARKER MARGIN INK (KITS) ×3 IMPLANT
KIT ROOM TURNOVER OR (KITS) ×3 IMPLANT
MARKER SKIN DUAL TIP RULER LAB (MISCELLANEOUS) ×3 IMPLANT
NEEDLE HYPO 25GX1X1/2 BEV (NEEDLE) ×3 IMPLANT
NS IRRIG 1000ML POUR BTL (IV SOLUTION) ×3 IMPLANT
PACK GENERAL/GYN (CUSTOM PROCEDURE TRAY) ×3 IMPLANT
PACK SURGICAL SETUP 50X90 (CUSTOM PROCEDURE TRAY) ×3 IMPLANT
PAD ARMBOARD 7.5X6 YLW CONV (MISCELLANEOUS) IMPLANT
PENCIL BUTTON HOLSTER BLD 10FT (ELECTRODE) ×3 IMPLANT
SPONGE GAUZE 4X4 12PLY (GAUZE/BANDAGES/DRESSINGS) ×3 IMPLANT
SPONGE LAP 18X18 X RAY DECT (DISPOSABLE) ×3 IMPLANT
STAPLER VISISTAT 35W (STAPLE) ×3 IMPLANT
STRIP CLOSURE SKIN 1/2X4 (GAUZE/BANDAGES/DRESSINGS) ×3 IMPLANT
SUT MNCRL AB 4-0 PS2 18 (SUTURE) ×3 IMPLANT
SUT SILK 2 0 SH (SUTURE) ×3 IMPLANT
SUT VIC AB 2-0 SH 27 (SUTURE) ×1
SUT VIC AB 2-0 SH 27XBRD (SUTURE) ×2 IMPLANT
SUT VIC AB 3-0 SH 27 (SUTURE) ×1
SUT VIC AB 3-0 SH 27X BRD (SUTURE) ×2 IMPLANT
SUT VIC AB 3-0 SH 8-18 (SUTURE) ×3 IMPLANT
SYR CONTROL 10ML LL (SYRINGE) ×3 IMPLANT
TOWEL OR 17X24 6PK STRL BLUE (TOWEL DISPOSABLE) ×3 IMPLANT
TOWEL OR 17X26 10 PK STRL BLUE (TOWEL DISPOSABLE) ×3 IMPLANT
TUBE CONNECTING 12X1/4 (SUCTIONS) ×3 IMPLANT
YANKAUER SUCT BULB TIP NO VENT (SUCTIONS) ×3 IMPLANT

## 2013-05-26 NOTE — Anesthesia Postprocedure Evaluation (Signed)
Anesthesia Post Note  Patient: Casey Taylor  Procedure(s) Performed: Procedure(s) (LRB): BREAST BIOPSY WITH NEEDLE LOCALIZATION (Left) PARTIAL MASTECTOMY WITH NEEDLE LOCALIZATION (Right)  Anesthesia type: general  Patient location: PACU  Post pain: Pain level controlled  Post assessment: Patient's Cardiovascular Status Stable  Last Vitals:  Filed Vitals:   05/26/13 1543  BP: 153/73  Pulse: 41  Temp:   Resp: 10    Post vital signs: Reviewed and stable  Level of consciousness: sedated  Complications: No apparent anesthesia complications

## 2013-05-26 NOTE — Anesthesia Procedure Notes (Signed)
Procedure Name: LMA Insertion Date/Time: 05/26/2013 1:54 PM Performed by: Armandina Gemma Pre-anesthesia Checklist: Patient identified, Timeout performed, Emergency Drugs available, Suction available and Patient being monitored Patient Re-evaluated:Patient Re-evaluated prior to inductionOxygen Delivery Method: Circle system utilized Preoxygenation: Pre-oxygenation with 100% oxygen Intubation Type: IV induction Ventilation: Mask ventilation without difficulty LMA: LMA with gastric port inserted Tube type: Oral Number of attempts: 1 Placement Confirmation: positive ETCO2 and breath sounds checked- equal and bilateral Tube secured with: Tape Dental Injury: Teeth and Oropharynx as per pre-operative assessment  Comments: IV induction Singer- LMA AM CRNA- atraumatic- many missing teeth prior to LMA insertion- teeth as preop after LMA insertion

## 2013-05-26 NOTE — Transfer of Care (Signed)
Immediate Anesthesia Transfer of Care Note  Patient: Casey Taylor  Procedure(s) Performed: Procedure(s): BREAST BIOPSY WITH NEEDLE LOCALIZATION (Left) PARTIAL MASTECTOMY WITH NEEDLE LOCALIZATION (Right)  Patient Location: PACU  Anesthesia Type:General  Level of Consciousness: awake, alert  and oriented  Airway & Oxygen Therapy: Patient Spontanous Breathing and Patient connected to nasal cannula oxygen  Post-op Assessment: Report given to PACU RN and Post -op Vital signs reviewed and stable  Post vital signs: Reviewed and stable  Complications: No apparent anesthesia complications

## 2013-05-26 NOTE — Op Note (Signed)
Right needle localized partial mastectomy and left needle localized Excisional Breast Biopsy  Indications: This patient presents with history of Right breast cancer Tis and left breast calcifications not amenable to stereotactic biopsy  Pre-operative Diagnosis: Right breast cancer, left breast calcs.    Post-operative Diagnosis: Same   Surgeon: Almond Lint   Anesthesia: General LMA anesthesia and Local anesthesia 1% lidocaine, 0.25% bupivocaine  ASA Class: 2  Procedure Details  The patient was seen in the Holding Room. The risks, benefits, complications, treatment options, and expected outcomes were discussed with the patient. The possibilities of reaction to medication, pulmonary aspiration, bleeding, infection, the need for additional procedures, failure to diagnose a condition, and creating a complication requiring transfusion or operation were discussed with the patient. The patient concurred with the proposed plan, giving informed consent.  The site of surgery properly noted/marked. The patient was taken to Operating Room # 2, identified, and the procedure verified. A Time Out was held and the above information confirmed.  After induction of anesthesia, the bilateral  breast and chest were prepped and draped in standard fashion. The left lumpectomy was performed by creating an oblique incision over the lower outer quadrant of the breastaround the previously placed localization guidewire.  Dissection was carried down just below the nipple.  Orientation sutures were placed.  Marland Kitchen  Specimen radiography confirmed inclusion of the mammographic lesion.  Hemostasis was achieved with cautery.  The wound was irrigated and closed with a 3-0 Vicryl interrupted deep dermal stitch and a 4-0 Monocryl subcuticular closure in layers.     The right lumpectomy was performed by creating an oblique incision over the upper inner quadrant of the breast around the previously placed localization guidewire.  There was  a dimple in the skin that was excised with the wire.  Dissection was carried down to the pectoralis fascia.  Anterior margin is skin.  The lumpectomy margins were inked.  Specimen radiography confirmed inclusion of the mammographic lesion. Large clips were placed in the cavity to mark.   Hemostasis was achieved with cautery.  The wound was irrigated and closed with a 3-0 Vicryl interrupted deep dermal stitch and a 4-0 Monocryl subcuticular closure in layers.   Sterile dressings were applied. At the end of the operation, all sponge, instrument, and needle counts were correct.  Findings: grossly clear surgical margins  Estimated Blood Loss:  Minimal          Specimens: left needle localized breast biopsy and right needle localized partial mastectomy         Complications:  None; patient tolerated the procedure well.         Disposition: PACU - hemodynamically stable.         Condition: stable

## 2013-05-26 NOTE — Anesthesia Preprocedure Evaluation (Addendum)
Anesthesia Evaluation  Patient identified by MRN, date of birth, ID band Patient awake    Reviewed: Allergy & Precautions, H&P , NPO status , Patient's Chart, lab work & pertinent test results, reviewed documented beta blocker date and time   History of Anesthesia Complications Negative for: history of anesthetic complications  Airway Mallampati: II TM Distance: >3 FB Neck ROM: Full    Dental  (+) Partial Upper and Dental Advisory Given   Pulmonary Recent URI , Resolved,          Cardiovascular hypertension, Pt. on medications     Neuro/Psych Anxiety CVA, No Residual Symptoms    GI/Hepatic Neg liver ROS, GERD-  Medicated and Controlled,  Endo/Other  negative endocrine ROS  Renal/GU negative Renal ROS     Musculoskeletal   Abdominal   Peds  Hematology   Anesthesia Other Findings   Reproductive/Obstetrics                          Anesthesia Physical Anesthesia Plan  ASA: III  Anesthesia Plan: General   Post-op Pain Management:    Induction: Intravenous  Airway Management Planned: LMA  Additional Equipment:   Intra-op Plan:   Post-operative Plan: Extubation in OR  Informed Consent:   Dental advisory given  Plan Discussed with: CRNA, Anesthesiologist and Surgeon  Anesthesia Plan Comments:         Anesthesia Quick Evaluation

## 2013-05-26 NOTE — Preoperative (Signed)
Beta Blockers   Reason not to administer Beta Blockers:Not Applicable 

## 2013-05-26 NOTE — H&P (Signed)
Chief complaint: New right breast DCIS.   HISTORY:  Is a 77 year old female who is referred by Dr. Renae Gloss for evaluation of her new right breast cancer. These were six-month followup for calcifications. The calcifications did change significantly and so she underwent diagnostic mammography and biopsy. 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 She subsequently underwent MRI and was found to have a second area that was 2.5 cm away that was concerning. This has not been biopsied yet.   She does not have any family history of breast cancer. She and her call when she menarche. She is not vomiting. Since she had her hysterectomy. She did have 5 children that she carried to term. She has not used hormone replacement were hormonal contraception. She has had a colonoscopy and bone density scan.   She has not had any change in her history.  Past Medical History   Diagnosis  Date   .  Hypertension    .  Stroke    .  Breast cancer    .  Anxiety    .  Arthritis    .  Gout     Past Surgical History   Procedure  Laterality  Date   .  Abdominal hysterectomy     .  Cholecystectomy      Current Outpatient Prescriptions   Medication  Sig  Dispense  Refill   .  aspirin 81 MG tablet  Take 81 mg by mouth daily.     .  naproxen sodium (ALEVE) 220 MG tablet  Take 2 tablets every 12 hours for toe pain.      No current facility-administered medications for this visit.    Allergies   Allergen  Reactions   .  Codeine  Nausea Only   No pertinent family history.  History    Social History   .  Marital Status:  Divorced     Spouse Name:  N/A     Number of Children:  N/A   .  Years of Education:  N/A    Social History Main Topics   .  Smoking status:  Never Smoker   .  Smokeless tobacco:  Not on file   .  Alcohol Use:  Yes   .  Drug Use:  No   .  Sexual Activity:  Not on file    Other Topics  Concern   .  Not on file    Social History Narrative   .  Daughter is 2900  nurse.    REVIEW OF SYSTEMS - PERTINENT POSITIVES ONLY:  12 point review of systems negative other than HPI and PMH except for sinus issues, dentures, history of ulcer, back pain, arthritis, gout, anxiety  EXAM:  Wt Readings from Last 3 Encounters:  05/19/13 185 lb 1.6 oz (83.961 kg)  04/22/13 183 lb 3.2 oz (83.099 kg)  02/17/12 159 lb (72.122 kg)   Temp Readings from Last 3 Encounters:  05/26/13 97.5 F (36.4 C) Oral  05/26/13 97.5 F (36.4 C) Oral  05/19/13 97.9 F (36.6 C)    BP Readings from Last 3 Encounters:  05/26/13 159/75  05/26/13 159/75  05/19/13 138/62   Pulse Readings from Last 3 Encounters:  05/26/13 57  05/26/13 57  05/19/13 55   Gen: No acute distress. Well nourished and well groomed.  Neurological: Alert and oriented to person, place, and time. Coordination normal.  Head: Normocephalic and atraumatic.  Eyes: Conjunctivae are normal. Pupils are equal,  round, and reactive to light. No scleral icterus.   Cardiovascular: Normal rate, regular rhythm, and intact distal pulses.  Respiratory: Effort normal. No respiratory distress.  Breast: No palpable mass in either breast. No nipple retraction or skin dimpling. There is no nipple discharge, no lymphadenopathy.  Musculoskeletal: Normal range of motion. Extremities are nontender.  Lymphadenopathy: No cervical, preauricular, postauricular or axillary adenopathy is present Skin: Skin is warm and dry. No rash noted. No diaphoresis. No erythema. No pallor. No clubbing, cyanosis, or edema.  Psychiatric: Normal mood and affect. Behavior is normal. Judgment and thought content normal.   LABORATORY RESULTS:  Available labs are reviewed  Pathology  Diagnosis  Breast, right, needle core biopsy, 1 o'clock  - DUCTAL CARCINOMA IN SITU SEE COMMENT.Marland Kitchen  ER/PR +   RADIOLOGY RESULTS:  See E-Chart or I-Site for most recent results. Images and reports are reviewed.  MR  IMPRESSION:  1. Post biopsy changes in the upper inner  quadrant of the right  breast.  2. 1.2 cm area of clumped linear enhancement in the upper inner  right breast, 2.5 cm anterior and inferior to the biopsy marker  clip. This is suspicious for a 2nd focus of ductal carcinoma in  situ. If breast conservation is desired, MRI guided core needle  biopsy of this area would be recommended.  3. No evidence of malignancy on the left and no adenopathy.  Mammo  FINDINGS:  Routine a magnification views of both breasts are performed.  A 5 x 5 mm group of heterogeneous calcifications in the upper right  breast (middle 3rd) have increased in number, size and  heterogeneity. Slight increased density is associated with these  calcifications.  Other calcifications scattered within both breasts are unchanged.  Scarring within the right breast is again noted.  No other suspicious findings are identified.  Mammographic images were processed with CAD.  IMPRESSION:  Developing indeterminate calcifications within the upper right  breast -tissue sampling is recommended.  Other stable likely benign bilateral breast calcifications.  Six-month followup is recommended to ensure stability.   ASSESSMENT AND PLAN:  Breast cancer of upper-inner quadrant of right female breast   She also will need a needle localized excisional biopsy on the left. She had suspicious calcifications but these are unable to be biopsied due to the proximity of the calcifications to the skin. These are in the subareolar location.   Otherwise, our plan will be to do a right needle localized partial mastectomy and a left needle localized excisional breast biopsy.  I discussed that if it were to be left untreated, it will likely grow significantly and require more extensive surgery and/or chemotherapy or radiation to treat.   The surgical procedure was described to the patient. I discussed the incision type and location and that we would need radiology involved on the day of surgery with a wire  marker and possibly a sentinel node injection.   The risks and benefits of the procedure were described to the patient and he/she wishes to proceed. We discussed the risks bleeding, infection, damage to other structures, need for further procedures/surgeries. We discussed the risk of seroma. The patient was advised if the area in the breast is invasive cancer, we may need to go back to surgery for additional tissue to obtain negative margins or for a lymph node biopsy. The patient was advised that these are the most common complications, but that others can occur as well. They were advised against taking aspirin or other anti-inflammatory agents/blood  thinners the week before surgery.

## 2013-05-27 ENCOUNTER — Telehealth (INDEPENDENT_AMBULATORY_CARE_PROVIDER_SITE_OTHER): Payer: Self-pay | Admitting: *Deleted

## 2013-05-27 NOTE — Telephone Encounter (Signed)
I called pt to check on her postoperatively.  She states she is doing good just a little sore, resting and taking her pain medication.  I informed her of her post op appt with Dr. Donell Beers on 12/19 with an arrival time of 10:00am. I instructed pt to call our office with any questions or concerns.  She is agreeable with this appt.

## 2013-05-28 ENCOUNTER — Encounter (HOSPITAL_COMMUNITY): Payer: Self-pay | Admitting: General Surgery

## 2013-05-29 ENCOUNTER — Telehealth (INDEPENDENT_AMBULATORY_CARE_PROVIDER_SITE_OTHER): Payer: Self-pay

## 2013-05-29 NOTE — Progress Notes (Signed)
Quick Note:  Please let Casey Taylor know her path on the left does not show cancer. The one on the right is still the DCIS (non invasive cancer). All margins are negative. No further surgery needed. ______

## 2013-05-29 NOTE — Telephone Encounter (Signed)
Pt made aware of pathology results.

## 2013-06-02 ENCOUNTER — Ambulatory Visit (INDEPENDENT_AMBULATORY_CARE_PROVIDER_SITE_OTHER): Payer: Federal, State, Local not specified - PPO | Admitting: General Surgery

## 2013-06-02 VITALS — BP 130/72 | HR 81 | Temp 99.0°F | Resp 18 | Ht 64.0 in | Wt 183.0 lb

## 2013-06-02 DIAGNOSIS — C50211 Malignant neoplasm of upper-inner quadrant of right female breast: Secondary | ICD-10-CM

## 2013-06-02 DIAGNOSIS — C50219 Malignant neoplasm of upper-inner quadrant of unspecified female breast: Secondary | ICD-10-CM

## 2013-06-02 NOTE — Assessment & Plan Note (Signed)
Warm compressed to upper breast.  Follow up in 3-6 months.

## 2013-06-02 NOTE — Patient Instructions (Signed)
Follow up in 3-6 months.  You should alternate my visits with Dr. Welton Flakes.  Call earlier if you have concerns.

## 2013-06-02 NOTE — Progress Notes (Signed)
HISTORY: 77 year old female is approximately 2 weeks status post right-sided needle localized mastectomy for DCIS. Margins are negative and she had no evidence of invasive cancer. She is still a little bit sore but is not taking frequent pain medication. She thinks she had taken 2 Percocet in the last week. She denies fevers and chills.    EXAM: General:  Alert and oriented.   Incision:  Healing well.  Small hematoma.  No evidence of infection.     PATHOLOGY: Diagnosis 1. Breast, lumpectomy, Left - INTRADUCTAL PAPILLOMA WITH SCLEROSIS AND CALCIFICATIONS. SEE COMMENT. - NEGATIVE FOR ATYPIA OR MALIGNANCY. 2. Breast, lumpectomy, Right - DUCTAL CARCINOMA IN SITU SEE COMMENT. - IN SITU CARCINOMA IS 0.3 CM FROM NEAREST MARGIN (POSTERIOR MEDIAL). - SEE TUMOR SYNOPTIC TEMPLATE BELOW.   ASSESSMENT AND PLAN:   Breast cancer of upper-inner quadrant of right female breast Warm compressed to upper breast.  Follow up in 3-6 months.        Maudry Diego, MD Surgical Oncology, General & Endocrine Surgery [facility]  Alva Garnet., MD Alva Garnet., MD

## 2013-06-03 ENCOUNTER — Encounter: Payer: Self-pay | Admitting: Radiation Oncology

## 2013-06-03 ENCOUNTER — Encounter: Payer: Self-pay | Admitting: Adult Health

## 2013-06-03 ENCOUNTER — Ambulatory Visit (HOSPITAL_BASED_OUTPATIENT_CLINIC_OR_DEPARTMENT_OTHER): Payer: Medicare Other | Admitting: Adult Health

## 2013-06-03 VITALS — BP 125/63 | HR 58 | Temp 98.3°F | Resp 18 | Ht 64.0 in | Wt 184.5 lb

## 2013-06-03 DIAGNOSIS — C50211 Malignant neoplasm of upper-inner quadrant of right female breast: Secondary | ICD-10-CM

## 2013-06-03 DIAGNOSIS — D0590 Unspecified type of carcinoma in situ of unspecified breast: Secondary | ICD-10-CM | POA: Diagnosis not present

## 2013-06-03 DIAGNOSIS — Z170 Estrogen receptor positive status [ER+]: Secondary | ICD-10-CM | POA: Diagnosis not present

## 2013-06-03 NOTE — Progress Notes (Signed)
Chart note: The patient was presented at the breast conference this morning. She does not be radiation therapy as long she takes adjuvant hormone therapy. She is getting her prescription filled . I told her that she did not need to see me next week as scheduled.

## 2013-06-03 NOTE — Patient Instructions (Signed)

## 2013-06-04 MED ORDER — ANASTROZOLE 1 MG PO TABS: 1.0000 mg | ORAL_TABLET | Freq: Every day | ORAL | Status: DC

## 2013-06-04 NOTE — Progress Notes (Addendum)
Casey Taylor [id number] August 31, 1935 77 y.o. 06/04/2013 2:02 PM  CC  Alva Garnet., MD [address] [place] [address] [place] [address]  Dr. Chipper Herb Dr. Everardo Beals  DIAGNOSIS:  77 year old female with new diagnosis of right breast DCIS.   STAGE:   Breast cancer of upper-inner quadrant of right female breast   Primary site: Breast (Right)   Staging method: AJCC 7th Edition   Clinical: Stage 0 (Tis (DCIS), N0, cM0)   Summary: Stage 0 (Tis (DCIS), N0, cM0)  REFERRING PHYSICIAN: Dr. Everardo Beals  PRIOR THERAPY:  1.  Patient has been having screening mammograms on regular basis. She had a six-month followup mammogram that showed bilateral calcifications. On the right side she was noted to have 5 x 5 mm right calcifications. She was also had left calcifications suspicious but too close to the skin to biopsy. She underwent MRI of the breasts. She was noted to have biopsy changes as well as a 1.2 cm area of concern that was 2.5 cm anterior and inferior to the biopsy clip. On the left no lesions were seen. But it was felt that these were subareolar. 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 Patient was recommended to undergo biopsy of the second lesion in the right breast. This biopsy is scheduled on 04/29/2013. Patient's case was discussed at the multidisciplinary breast conference. Her radiology and pathology were reviewed. She is without any complaints related to her breast cancer.  2. On 05/26/13 patient underwent a left lumpectomy which revealed an intraductal papilloma with sclerosis and calcifications.  It was negative for atypia or malignancy.  She underwent a right lumpectomy that revealed DCIS, with margins negative.  ER/PR positive.    INTERVAL HISTORY: Patient underwent lumpectomies on 05/26/13 and is recovering from them very well.  She is here today for follow up to discuss her pathology results and next steps.  She  already has an appointment with Dr. Dayton Scrape on 12/23.  She is healing well following surgery and a 10 point ROS is otherwise neg.  Past Medical History: Past Medical History  Diagnosis Date  . Breast cancer   . Anxiety   . Arthritis   . Gout   . Stroke     tia    06  . Hypertension     dr Renae Gloss    Past Surgical History: Past Surgical History  Procedure Laterality Date  . Abdominal hysterectomy    . Cholecystectomy    . Breast biopsy Left 05/26/2013    Procedure: BREAST BIOPSY WITH NEEDLE LOCALIZATION;  Surgeon: Almond Lint, MD;  Location: [facility] OR;  Service: General;  Laterality: Left;  . Partial mastectomy with needle localization Right 05/26/2013    Procedure: PARTIAL MASTECTOMY WITH NEEDLE LOCALIZATION;  Surgeon: Almond Lint, MD;  Location: [facility] OR;  Service: General;  Laterality: Right;    Family History: No family history on file.  Social History History  Substance Use Topics  . Smoking status: Never Smoker   . Smokeless tobacco: Not on file  . Alcohol Use: Yes     Comment: weekly    Allergies: Allergies  Allergen Reactions  . Codeine Nausea Only    Current Medications: Current Outpatient Prescriptions  Medication Sig Dispense Refill  . ALPRAZolam (XANAX) 0.25 MG tablet Take 0.25 mg by mouth at bedtime as needed for anxiety.      Marland Kitchen aspirin 81 MG tablet Take 81 mg by mouth daily.      Marland Kitchen  atenolol-chlorthalidone (TENORETIC) 50-25 MG per tablet Take 1 tablet by mouth daily.      . Cholecalciferol (VITAMIN D PO) Take 2,000 Int'l Units by mouth daily.      . colchicine 0.6 MG tablet Take 0.6 mg by mouth daily.      Marland Kitchen HYDROcodone-acetaminophen (NORCO/VICODIN) 5-325 MG per tablet Take 1-2 tablets by mouth every 4 (four) hours as needed.  30 tablet  0  . Magnesium 250 MG TABS Take 1 tablet by mouth daily.      . meloxicam (MOBIC) 15 MG tablet Take 15 mg by mouth daily.      . naproxen sodium (ALEVE) 220 MG tablet Take 2 tablets every 12 hours for toe pain.      .  potassium gluconate 595 MG TABS tablet Take 595 mg by mouth daily.       No current facility-administered medications for this visit.   REVIEW OF SYSTEMS:  A 10 point review of systems was conducted and is otherwise negative except for what is noted above.    PHYSICAL EXAMINATION: Blood pressure 125/63, pulse 58, temperature 98.3 F (36.8 C), temperature source Oral, resp. rate 18, height 5\' 4"  (1.626 m), weight 184 lb 8 oz (83.689 kg). General: Patient is a well appearing female in no acute distress HEENT: PERRLA, sclerae anicteric no conjunctival pallor, MMM Neck: supple, no palpable adenopathy Lungs: clear to auscultation bilaterally, no wheezes, rhonchi, or rales Cardiovascular: regular rate rhythm, S1, S2, no murmurs, rubs or gallops Abdomen: Soft, non-tender, non-distended, normoactive bowel sounds, no HSM Extremities: warm and well perfused, no clubbing, cyanosis, or edema Skin: No rashes or lesions Neuro: Non-focal Breasts: bilateral lumpectomy sites healing well ECOG PERFORMANCE STATUS: 0 - Asymptomatic    STUDIES/RESULTS: Mr Breast Bilateral W Wo Contrast  04/17/2013   CLINICAL DATA:  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  EXAM: MR BILATERAL BREAST WITHOUT AND WITH CONTRAST  LABS:  BUN and creatinine were obtained on site at [facility] at  [address]  Results:  BUN 24 mg/dL,  Creatinine 1.2 mg/dL.  TECHNIQUE: Multiplanar, multisequence MR images of both breasts were obtained prior to and following the intravenous administration of 15ml of MultiHance.  THREE-DIMENSIONAL MR IMAGE RENDERING ON INDEPENDENT WORKSTATION:  Three-dimensional MR images were rendered by post-processing of the original MR data on an independent workstation. The three-dimensional MR images were interpreted, and findings are reported in the following complete MRI report for this study.  COMPARISON:  Previous exams, including the recent mammograms and stereotactic guided  core needle biopsy.  FINDINGS: Breast composition: b. Scattered fibroglandular tissue  Background parenchymal enhancement: Mild, nodular.  Right breast: Post biopsy tract and biopsy marker clip artifact in the upper inner quadrant of the right breast, corresponding to the location of the recently biopsied ductal carcinoma in situ. 2.5 cm anterior and inferior to the biopsy marker clip, there is 1.2 x 0.6 x 0.5 cm area of clumped linear enhancement.  Left breast: No mass or abnormal enhancement.  Lymph nodes: No abnormal appearing lymph nodes.  Ancillary findings:  None.  IMPRESSION: 1. Post biopsy changes in the upper inner quadrant of the right breast. 2. 1.2 cm area of clumped linear enhancement in the upper inner right breast, 2.5 cm anterior and inferior to the biopsy marker clip. This is suspicious for a 2nd focus of ductal carcinoma in situ. If breast conservation is desired, MRI guided core needle biopsy of this area would be recommended. 3. No evidence  of malignancy on the left and no adenopathy.  RECOMMENDATION: Right breast MR guided core needle biopsy. This will be scheduled.  BI-RADS CATEGORY  4: Suspicious abnormality - biopsy should be considered.   Electronically Signed   By: Gordan Payment M.D.   On: 04/17/2013 16:29   Mm Digital Diagnostic Bilat  04/01/2013   CLINICAL DATA:  77 year old female - followup bilateral breast calcifications  EXAM: DIGITAL DIAGNOSTIC  BILATERAL MAMMOGRAM with CAD  COMPARISON:  09/29/2012 and prior mammograms dating back to 07/19/2009  ACR Breast Density Category d: The breasts are extremely dense, which lowers the sensitivity of mammography.  FINDINGS: Routine a magnification views of both breasts are performed.  A 5 x 5 mm group of heterogeneous calcifications in the upper right breast (middle 3rd) have increased in number, size and heterogeneity. Slight increased density is associated with these calcifications.  Other calcifications scattered within both breasts are  unchanged.  Scarring within the right breast is again noted.  No other suspicious findings are identified.  Mammographic images were processed with CAD.  IMPRESSION: Developing indeterminate calcifications within the upper right breast -tissue sampling is recommended.  Other stable likely benign bilateral breast calcifications. Six-month followup is recommended to ensure stability.  RECOMMENDATION: Stereotactic guided right breast biopsy. These results were discussed with the patient and she agrees to proceed with stereotactic guided biopsy. As she would like to have her daughter present at her biopsy appointment, she will contact us to schedule the stereotactic biopsy after she checks her daughter's schedule.  Bilateral diagnostic mammograms with magnification views in 6 months to follow-up other likely benign breast calcifications.  I have discussed the findings and recommendations with the patient. Results were also provided in writing at the conclusion of the visit.  BI-RADS CATEGORY  4: Suspicious abnormality - biopsy should be considered.   Electronically Signed   By: Laveda Abbe M.D.   On: 04/01/2013 11:39   Mm Rt Breast Bx W Loc Dev 1st Lesion Image Bx Spec Stereo Guide  04/17/2013   ADDENDUM REPORT: 04/17/2013 18:25  ADDENDUM: Scheduled stereotactic biopsy of left subaerolar calcifications could not be performed due to nipple retraction and insufficient tissue thickness at the anticipated biopsy site both in the CC and ML planes. I discussed the need for tissue sampling with needle localization and surgical biopsy if MRI scheduled for 04/17/2013 does not show evidence for other suspicious sites in the left breast ammenable to MRI guided biopsy initially. The patient understood this recommendation.   Electronically Signed   By: Leda Gauze M.D.   On: 04/17/2013 18:25   04/17/2013   ADDENDUM REPORT: 04/17/2013 18:23  ADDENDUM: Scheduled stereotactic biopsy of left subaerolar calcifications could not be  performed due to nipple retraction and insufficient tissue thickness at the anticipated biopsy site both in the CC and ML planes. I discussed the need for tissue sampling with needle localization and surgical biopsy if MRI scheduled for 04/17/2013 does not show evidence for other suspicious sites in the left breast ammenable to MRI guided biopsy initially. The patient understood this recommendation.   Electronically Signed   By: Leda Gauze M.D.   On: 04/17/2013 18:23   04/15/2013   ADDENDUM REPORT: 04/15/2013 10:58  ADDENDUM: Ductal carcinoma in situ was reported histologically from the calcifications biopsied in the right breast. This is concordant with the imaging findings. MRI has been scheduled on 04/17/2013. The patient will be seen at the [facility] on 04/22/2013. The patient was contacted and given  the results of the biopsy. She states the biopsy site is clean and dry with no hematoma or signs of infection. Stereotactic core biopsy of calcifications in the left subareolar region is recommended. This has been scheduled for 04/16/2013.   Electronically Signed   By: Baird Lyons M.D.   On: 04/15/2013 10:58   04/17/2013   CLINICAL DATA:  Suspicious right breast calcifications.  EXAM: STEREOTACTIC VACUUM ASSIST RIGHT  COMPARISON:  Previous exams.  FINDINGS: I met with the patient and we discussed the procedure of stereotactic-guided biopsy, including benefits and alternatives. We discussed the high likelihood of a successful procedure. We discussed the risks of the procedure, including infection, bleeding, tissue injury, clip migration, and inadequate sampling. Informed, written consent was given.  Using sterile technique and 2% Lidocaine as local anesthetic, under stereotactic guidance, a 9 gauge vacuum assisted device was used to perform core needle biopsy of calcifications within the right breast 1 o'clock position middle depth using a superior approach. Specimen radiograph was performed,  showing calcifications. Specimens with calcifications are identified for pathology.  At the conclusion of the procedure, a top hat shaped tissue marker clip was deployed into the biopsy cavity. Follow-up 2-view mammogram confirmed clip in appropriate position.  IMPRESSION: Stereotactic-guided biopsy of right breast calcifications. No apparent complications.  Electronically Signed: By: Annia Belt M.D. On: 04/09/2013 11:50     LABS:    Chemistry      Component Value Date/Time   NA 142 05/19/2013 1050   NA 141 04/22/2013 1220   K 3.9 05/19/2013 1050   K 4.3 04/22/2013 1220   CL 102 05/19/2013 1050   CO2 31 05/19/2013 1050   CO2 28 04/22/2013 1220   BUN 24* 05/19/2013 1050   BUN 28.1* 04/22/2013 1220   CREATININE 1.27* 05/19/2013 1050   CREATININE 1.2* 04/22/2013 1220      Component Value Date/Time   CALCIUM 9.8 05/19/2013 1050   CALCIUM 10.4 04/22/2013 1220   ALKPHOS 117 04/22/2013 1220   AST 26 04/22/2013 1220   ALT 31 04/22/2013 1220   BILITOT 0.47 04/22/2013 1220      Lab Results  Component Value Date   WBC 6.8 05/19/2013   HGB 12.7 05/19/2013   HCT 38.4 05/19/2013   MCV 94.3 05/19/2013   PLT 299 05/19/2013   PATHOLOGY: ADDITIONAL INFORMATION: PROGNOSTIC INDICATORS - ACIS Results: IMMUNOHISTOCHEMICAL AND MORPHOMETRIC ANALYSIS BY THE AUTOMATED CELLULAR IMAGING SYSTEM (ACIS) Estrogen Receptor: 100%, POSITIVE, STRONG STAINING INTENSITY Progesterone Receptor: 86%, POSITIVE, MODERATE STAINING INTENSITY REFERENCE RANGE ESTROGEN RECEPTOR NEGATIVE <1% POSITIVE =>1% PROGESTERONE RECEPTOR NEGATIVE <1% POSITIVE =>1% All controls stained appropriately Jimmy Picket MD Pathologist, Electronic Signature ( Signed 04/15/2013) FINAL DIAGNOSIS Diagnosis Breast, right, needle core biopsy, 1 o'clock - DUCTAL CARCINOMA IN SITU SEE COMMENT.. Microscopic Comment With these biopsies the in situ carcinoma is Grade II. Although there is angulation to the glands on the H&E slides, the corresponding  slides taken for immunostain evaluation (smooth muscle myosin heavy chain, p63, and calponin) demonstrate preservation of the myoepithelial layer. Breast prognostic studies are pending and 1 of 2 FINAL for Glasheen, Jestine [phone]) Microscopic Comment(continued) will be reported in an addendum. E-cadherin stain demonstrates strong diffuse expression; supporting ductal origin for the in situ carcinoma. 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 Patient also has an incidental finding of another lesion measuring 1.2 cm in the right  breast.  Patient also was noted to have calcifications in the subareolar region of the left breast which could not be biopsied and will need needle localization and lumpectomy.  #2 On 05/26/13 patient underwent a left lumpectomy which revealed an intraductal papilloma with sclerosis and calcifications.  It was negative for atypia or malignancy.  She underwent a right lumpectomy that revealed DCIS, with margins negative.  ER/PR positive.   PLAN:   #1 Ms. Mccahill is doing well today.  Her pathology results were discussed.  I discussed this patient with Dr. Welton Flakes.  We discussed anti estrogen therapy and I will prescribe Arimidex 1mg  daily.  I gave her details in her AVS as well as reviewed the medication with her in detail.  She will keep her f/u with Dr. Dayton Scrape next week and we will see her back in 4 months.    All questions were answered. The patient knows to call the clinic with any problems, questions or concerns. We can certainly see the patient much sooner if necessary.  I spent 25 minutes counseling the patient face to face. The total time spent in the appointment was 30 minutes.   Illa Level, NP Medical Oncology [facility] [phone] 06/04/2013, 2:02 PM   ATTENDING'S ATTESTATION:  I  personally reviewed patient's chart, examined patient myself, formulated the treatment plan as followed.    doing well, we will continue to follow her.  Drue Second, MD Medical/Oncology [facility] [phone] (beeper) [phone] (Office)  06/12/2013, 1:30 PM

## 2013-06-05 ENCOUNTER — Encounter (INDEPENDENT_AMBULATORY_CARE_PROVIDER_SITE_OTHER): Payer: Federal, State, Local not specified - PPO | Admitting: General Surgery

## 2013-06-09 ENCOUNTER — Ambulatory Visit: Payer: Medicare Other | Admitting: Radiation Oncology

## 2013-06-09 ENCOUNTER — Ambulatory Visit: Payer: Medicare Other

## 2013-06-23 ENCOUNTER — Other Ambulatory Visit: Payer: Self-pay | Admitting: Emergency Medicine

## 2013-06-24 ENCOUNTER — Other Ambulatory Visit: Payer: Self-pay | Admitting: Emergency Medicine

## 2013-06-25 ENCOUNTER — Telehealth: Payer: Self-pay | Admitting: Oncology

## 2013-06-25 NOTE — Telephone Encounter (Signed)
, °

## 2013-08-07 ENCOUNTER — Encounter: Payer: Self-pay | Admitting: Podiatry

## 2013-08-07 ENCOUNTER — Ambulatory Visit (INDEPENDENT_AMBULATORY_CARE_PROVIDER_SITE_OTHER): Payer: Medicare Other | Admitting: Podiatry

## 2013-08-07 VITALS — BP 130/70 | HR 65 | Ht 62.0 in | Wt 184.0 lb

## 2013-08-07 DIAGNOSIS — M21619 Bunion of unspecified foot: Secondary | ICD-10-CM | POA: Insufficient documentation

## 2013-08-07 DIAGNOSIS — M109 Gout, unspecified: Secondary | ICD-10-CM | POA: Insufficient documentation

## 2013-08-07 DIAGNOSIS — M1990 Unspecified osteoarthritis, unspecified site: Secondary | ICD-10-CM

## 2013-08-07 MED ORDER — ALLOPURINOL 100 MG PO TABS: 100.0000 mg | ORAL_TABLET | Freq: Two times a day (BID) | ORAL | Status: DC

## 2013-08-07 NOTE — Patient Instructions (Signed)
Seen for pain on both feet from Gout. Medication dose increased for Gout.  Benefits of Ankle support, Cortisone injection, and bunionectomy discussed. Return as needed.

## 2013-08-07 NOTE — Progress Notes (Signed)
Subjective: 78 year old female presents complaining of pain in both feet, started about 2-3 years ago.  Taking Gout medication and still getting pain on big joints just about every other week.   Review of Systems - Ophthalmic ROS: negative for - decreased vision, dry eyes or eye pain ENT ROS: negative Breast ROS: Surgery done in May 26, 2013. Taking medication now.  Respiratory ROS: no cough, shortness of breath, or wheezing Cardiovascular ROS: no chest pain or dyspnea on exertion Gastrointestinal ROS: no abdominal pain, change in bowel habits, or black or bloody stools Genito-Urinary ROS: no dysuria, trouble voiding, or hematuria Musculoskeletal ROS: negative Neurological ROS: no TIA or stroke symptoms Dermatological ROS: negative.  Objective: History of pain and swelling at the first MPJ bilateral. Normal neurovascular status.  Hallux valgus with bunion right.  Unstable first ray bilateral. Excess STJ pronation with lateral ankle left foot.   Assessment: 1. Chronic Gouty arthritis both big toe joints. 2. Hallux valgus with bunion right. 3. Osteoarthritis left first MPJ. 4. Unstable first ray and STJ hyperpronation with lateral ankle pain left.   Plan: Reviewed clinical findings and available treatment options.  Allopurinol dose increased from 100mg  to 200mg . Discussed benefit of Cortisone injection, ankle support, change of shoe gear and bunionectomy on right.

## 2013-08-17 DIAGNOSIS — D0590 Unspecified type of carcinoma in situ of unspecified breast: Secondary | ICD-10-CM | POA: Diagnosis not present

## 2013-08-17 DIAGNOSIS — Z79899 Other long term (current) drug therapy: Secondary | ICD-10-CM | POA: Diagnosis not present

## 2013-08-17 DIAGNOSIS — M109 Gout, unspecified: Secondary | ICD-10-CM | POA: Diagnosis not present

## 2013-08-17 DIAGNOSIS — I10 Essential (primary) hypertension: Secondary | ICD-10-CM | POA: Diagnosis not present

## 2013-08-19 ENCOUNTER — Ambulatory Visit (HOSPITAL_BASED_OUTPATIENT_CLINIC_OR_DEPARTMENT_OTHER): Payer: Medicare Other | Admitting: Adult Health

## 2013-08-19 ENCOUNTER — Other Ambulatory Visit: Payer: Medicare Other

## 2013-08-19 ENCOUNTER — Ambulatory Visit (HOSPITAL_BASED_OUTPATIENT_CLINIC_OR_DEPARTMENT_OTHER): Payer: Medicare Other

## 2013-08-19 ENCOUNTER — Telehealth: Payer: Self-pay | Admitting: Oncology

## 2013-08-19 ENCOUNTER — Encounter: Payer: Self-pay | Admitting: Adult Health

## 2013-08-19 VITALS — BP 129/82 | HR 65 | Temp 98.3°F | Resp 18 | Ht 62.0 in | Wt 187.5 lb

## 2013-08-19 DIAGNOSIS — D0590 Unspecified type of carcinoma in situ of unspecified breast: Secondary | ICD-10-CM | POA: Diagnosis not present

## 2013-08-19 DIAGNOSIS — Z170 Estrogen receptor positive status [ER+]: Secondary | ICD-10-CM | POA: Diagnosis not present

## 2013-08-19 DIAGNOSIS — C50211 Malignant neoplasm of upper-inner quadrant of right female breast: Secondary | ICD-10-CM

## 2013-08-19 LAB — COMPREHENSIVE METABOLIC PANEL (CC13)
ALT: 27 U/L (ref 0–55)
AST: 28 U/L (ref 5–34)
Albumin: 3.9 g/dL (ref 3.5–5.0)
Alkaline Phosphatase: 115 U/L (ref 40–150)
Anion Gap: 9 mEq/L (ref 3–11)
BUN: 21.9 mg/dL (ref 7.0–26.0)
CALCIUM: 10.1 mg/dL (ref 8.4–10.4)
CHLORIDE: 105 meq/L (ref 98–109)
CO2: 28 meq/L (ref 22–29)
Creatinine: 1 mg/dL (ref 0.6–1.1)
GLUCOSE: 96 mg/dL (ref 70–140)
POTASSIUM: 3.6 meq/L (ref 3.5–5.1)
Sodium: 141 mEq/L (ref 136–145)
Total Bilirubin: 0.57 mg/dL (ref 0.20–1.20)
Total Protein: 7.4 g/dL (ref 6.4–8.3)

## 2013-08-19 LAB — CBC WITH DIFFERENTIAL/PLATELET
BASO%: 1.5 % (ref 0.0–2.0)
Basophils Absolute: 0.1 10*3/uL (ref 0.0–0.1)
EOS%: 4.9 % (ref 0.0–7.0)
Eosinophils Absolute: 0.3 10*3/uL (ref 0.0–0.5)
HEMATOCRIT: 39.3 % (ref 34.8–46.6)
HGB: 12.5 g/dL (ref 11.6–15.9)
LYMPH%: 32.1 % (ref 14.0–49.7)
MCH: 29.7 pg (ref 25.1–34.0)
MCHC: 31.9 g/dL (ref 31.5–36.0)
MCV: 93.2 fL (ref 79.5–101.0)
MONO#: 0.5 10*3/uL (ref 0.1–0.9)
MONO%: 7.3 % (ref 0.0–14.0)
NEUT#: 3.8 10*3/uL (ref 1.5–6.5)
NEUT%: 54.2 % (ref 38.4–76.8)
PLATELETS: 312 10*3/uL (ref 145–400)
RBC: 4.21 10*6/uL (ref 3.70–5.45)
RDW: 13.4 % (ref 11.2–14.5)
WBC: 7 10*3/uL (ref 3.9–10.3)
lymph#: 2.2 10*3/uL (ref 0.9–3.3)

## 2013-08-19 MED ORDER — ANASTROZOLE 1 MG PO TABS: 1.0000 mg | ORAL_TABLET | Freq: Every day | ORAL | Status: DC

## 2013-08-19 NOTE — Progress Notes (Signed)
Casey Taylor [id number] 06-23-1935 78 y.o. 08/20/2013 11:48 AM  CC  Salena Saner., MD [address] [place] [address] [place] [address]  Dr. Arloa Koh Dr. Theda Sers  DIAGNOSIS:  78 year old female with new diagnosis of right breast DCIS.   STAGE:   Breast cancer of upper-inner quadrant of right female breast   Primary site: Breast (Right)   Staging method: AJCC 7th Edition   Clinical: Stage 0 (Tis (DCIS), N0, cM0)   Summary: Stage 0 (Tis (DCIS), N0, cM0)  REFERRING PHYSICIAN: Dr. Theda Sers  PRIOR THERAPY:  1.  Patient has been having screening mammograms on regular basis. She had a six-month followup mammogram that showed bilateral calcifications. On the right side she was noted to have 5 x 5 mm right calcifications. She was also had left calcifications suspicious but too close to the skin to biopsy. She underwent MRI of the breasts. She was noted to have biopsy changes as well as a 1.2 cm area of concern that was 2.5 cm anterior and inferior to the biopsy clip. On the left no lesions were seen. But it was felt that these were subareolar. 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 Patient was recommended to undergo biopsy of the second lesion in the right breast. This biopsy is scheduled on 04/29/2013. Patient's case was discussed at the multidisciplinary breast conference. Her radiology and pathology were reviewed. She is without any complaints related to her breast cancer.  2. On 05/26/13 patient underwent a left lumpectomy which revealed an intraductal papilloma with sclerosis and calcifications.  It was negative for atypia or malignancy.  She underwent a right lumpectomy that revealed DCIS, with margins negative.  ER/PR positive.    INTERVAL HISTORY: Casey Taylor is here for f/u today of her left DCIS.  She is on Arimidex daily.  She takes this every day and is tolerating it well.  She does have some mild joint aches  that she attributes to arthritis.  She denies fevers, chills, nausea, new pain, night sweats, unintentional weight loss, constipation, diarrhea, vomiting, numbness, weakness, dysuria, or any other concerns.  Her health maintenance was updated below.    Past Medical History: Past Medical History  Diagnosis Date  . Breast cancer   . Anxiety   . Arthritis   . Gout   . Stroke     tia    06  . Hypertension     dr Karlton Lemon    Past Surgical History: Past Surgical History  Procedure Laterality Date  . Abdominal hysterectomy    . Cholecystectomy    . Breast biopsy Left 05/26/2013    Procedure: BREAST BIOPSY WITH NEEDLE LOCALIZATION;  Surgeon: Stark Klein, MD;  Location: [facility];  Service: General;  Laterality: Left;  . Partial mastectomy with needle localization Right 05/26/2013    Procedure: PARTIAL MASTECTOMY WITH NEEDLE LOCALIZATION;  Surgeon: Stark Klein, MD;  Location: [facility];  Service: General;  Laterality: Right;    Family History: No family history on file.  Social History History  Substance Use Topics  . Smoking status: Never Smoker   . Smokeless tobacco: Never Used  . Alcohol Use: Yes     Comment: weekly    Allergies: Allergies  Allergen Reactions  . Codeine Nausea Only    Current Medications: Current Outpatient Prescriptions  Medication Sig Dispense Refill  . allopurinol (ZYLOPRIM) 100 MG tablet Take 1 tablet (100 mg total) by mouth 2 (two) times daily.  [place]  tablet  2  . ALPRAZolam (XANAX) 0.25 MG tablet Take 0.25 mg by mouth at bedtime as needed for anxiety.      Marland Kitchen anastrozole (ARIMIDEX) 1 MG tablet Take 1 tablet (1 mg total) by mouth daily.  90 tablet  12  . aspirin 81 MG tablet Take 81 mg by mouth daily.      Marland Kitchen atenolol-chlorthalidone (TENORETIC) 50-25 MG per tablet Take 1 tablet by mouth daily.      . Cholecalciferol (VITAMIN D PO) Take 2,000 Int'l Units by mouth daily.      . Magnesium 250 MG TABS Take 1 tablet by mouth daily.      . meloxicam (MOBIC) 15 MG  tablet Take 15 mg by mouth daily.      . naproxen sodium (ALEVE) 220 MG tablet Take 2 tablets every 12 hours for toe pain.      . potassium gluconate 595 MG TABS tablet Take 595 mg by mouth daily.      . colchicine 0.6 MG tablet Take 0.6 mg by mouth daily.       No current facility-administered medications for this visit.   REVIEW OF SYSTEMS:  A 10 point review of systems was conducted and is otherwise negative except for what is noted above.    Health Maintenance  Mammogram:04/2013 Colonoscopy: 2009 10 year f/u recommended Bone Density Scan: 03/13/2012,  normal Pap Smear: s/p TAH/BSO Eye Exam: 01/2013 Vitamin D Level: unsure Lipid Panel: 1 year ago   PHYSICAL EXAMINATION: Blood pressure 129/82, pulse 65, temperature 98.3 F (36.8 C), temperature source Oral, resp. rate 18, height _0  (1.575 m), weight 187 lb 8 oz (85.049 kg). GENERAL: Patient is a well appearing female in no acute distress HEENT:  Sclerae anicteric.  Oropharynx clear and moist. No ulcerations or evidence of oropharyngeal candidiasis. Neck is supple.  NODES:  No cervical, supraclavicular, or axillary lymphadenopathy palpated.  BREAST EXAM: left lumpectomy without nodularity, no masses or nodules in the left breast, no skin changes or nipple discharge.  Right lumpectomy site without nodularity, no masses or nodules in the right breast, no skin changes or nipple discharges LUNGS:  Clear to auscultation bilaterally.  No wheezes or rhonchi. HEART:  Regular rate and rhythm. No murmur appreciated. ABDOMEN:  Soft, nontender.  Positive, normoactive bowel sounds. No organomegaly palpated. MSK:  No focal spinal tenderness to palpation. Full range of motion bilaterally in the upper extremities. EXTREMITIES:  No peripheral edema.   SKIN:  Clear with no obvious rashes or skin changes. No nail dyscrasia. NEURO:  Nonfocal. Well oriented.  Appropriate affect. ECOG PERFORMANCE STATUS: 0 - Asymptomatic  STUDIES/RESULTS: Mr  Breast Bilateral W Wo Contrast  04/17/2013   CLINICAL DATA:  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  EXAM: MR BILATERAL BREAST WITHOUT AND WITH CONTRAST  LABS:  BUN and creatinine were obtained on site at [facility] at  [address]  Results:  BUN 24 mg/dL,  Creatinine 1.2 mg/dL.  TECHNIQUE: Multiplanar, multisequence MR images of both breasts were obtained prior to and following the intravenous administration of 55m of MultiHance.  THREE-DIMENSIONAL MR IMAGE RENDERING ON INDEPENDENT WORKSTATION:  Three-dimensional MR images were rendered by post-processing of the original MR data on an independent workstation. The three-dimensional MR images were interpreted, and findings are reported in the following complete MRI report for this study.  COMPARISON:  Previous exams, including the recent mammograms and stereotactic guided core needle biopsy.  FINDINGS: Breast composition: b. Scattered fibroglandular  tissue  Background parenchymal enhancement: Mild, nodular.  Right breast: Post biopsy tract and biopsy marker clip artifact in the upper inner quadrant of the right breast, corresponding to the location of the recently biopsied ductal carcinoma in situ. 2.5 cm anterior and inferior to the biopsy marker clip, there is 1.2 x 0.6 x 0.5 cm area of clumped linear enhancement.  Left breast: No mass or abnormal enhancement.  Lymph nodes: No abnormal appearing lymph nodes.  Ancillary findings:  None.  IMPRESSION: 1. Post biopsy changes in the upper inner quadrant of the right breast. 2. 1.2 cm area of clumped linear enhancement in the upper inner right breast, 2.5 cm anterior and inferior to the biopsy marker clip. This is suspicious for a 2nd focus of ductal carcinoma in situ. If breast conservation is desired, MRI guided core needle biopsy of this area would be recommended. 3. No evidence of malignancy on the left and no adenopathy.  RECOMMENDATION: Right breast MR guided core needle  biopsy. This will be scheduled.  BI-RADS CATEGORY  4: Suspicious abnormality - biopsy should be considered.   Electronically Signed   By: Enrique Sack M.D.   On: 04/17/2013 16:29   Mm Digital Diagnostic Bilat  04/01/2013   CLINICAL DATA:  78 year old female - followup bilateral breast calcifications  EXAM: DIGITAL DIAGNOSTIC  BILATERAL MAMMOGRAM with CAD  COMPARISON:  09/29/2012 and prior mammograms dating back to 07/19/2009  ACR Breast Density Category d: The breasts are extremely dense, which lowers the sensitivity of mammography.  FINDINGS: Routine a magnification views of both breasts are performed.  A 5 x 5 mm group of heterogeneous calcifications in the upper right breast (middle 3rd) have increased in number, size and heterogeneity. Slight increased density is associated with these calcifications.  Other calcifications scattered within both breasts are unchanged.  Scarring within the right breast is again noted.  No other suspicious findings are identified.  Mammographic images were processed with CAD.  IMPRESSION: Developing indeterminate calcifications within the upper right breast -tissue sampling is recommended.  Other stable likely benign bilateral breast calcifications. Six-month followup is recommended to ensure stability.  RECOMMENDATION: Stereotactic guided right breast biopsy. These results were discussed with the patient and she agrees to proceed with stereotactic guided biopsy. As she would like to have her daughter present at her biopsy appointment, she will contact us to schedule the stereotactic biopsy after she checks her daughter's schedule.  Bilateral diagnostic mammograms with magnification views in 6 months to follow-up other likely benign breast calcifications.  I have discussed the findings and recommendations with the patient. Results were also provided in writing at the conclusion of the visit.  BI-RADS CATEGORY  4: Suspicious abnormality - biopsy should be considered.    Electronically Signed   By: Hassan Rowan M.D.   On: 04/01/2013 11:39   Mm Rt Breast Bx W Loc Dev 1st Lesion Image Bx Spec Stereo Guide  04/17/2013   ADDENDUM REPORT: 04/17/2013 18:25  ADDENDUM: Scheduled stereotactic biopsy of left subaerolar calcifications could not be performed due to nipple retraction and insufficient tissue thickness at the anticipated biopsy site both in the CC and ML planes. I discussed the need for tissue sampling with needle localization and surgical biopsy if MRI scheduled for 04/17/2013 does not show evidence for other suspicious sites in the left breast ammenable to MRI guided biopsy initially. The patient understood this recommendation.   Electronically Signed   By: Willadean Carol M.D.   On: 04/17/2013 18:25  04/17/2013   ADDENDUM REPORT: 04/17/2013 18:23  ADDENDUM: Scheduled stereotactic biopsy of left subaerolar calcifications could not be performed due to nipple retraction and insufficient tissue thickness at the anticipated biopsy site both in the CC and ML planes. I discussed the need for tissue sampling with needle localization and surgical biopsy if MRI scheduled for 04/17/2013 does not show evidence for other suspicious sites in the left breast ammenable to MRI guided biopsy initially. The patient understood this recommendation.   Electronically Signed   By: Willadean Carol M.D.   On: 04/17/2013 18:23   04/15/2013   ADDENDUM REPORT: 04/15/2013 10:58  ADDENDUM: Ductal carcinoma in situ was reported histologically from the calcifications biopsied in the right breast. This is concordant with the imaging findings. MRI has been scheduled on 04/17/2013. The patient will be seen at the [facility] on 04/22/2013. The patient was contacted and given the results of the biopsy. She states the biopsy site is clean and dry with no hematoma or signs of infection. Stereotactic core biopsy of calcifications in the left subareolar region is recommended. This has been scheduled  for 04/16/2013.   Electronically Signed   By: Lillia Mountain M.D.   On: 04/15/2013 10:58   04/17/2013   CLINICAL DATA:  Suspicious right breast calcifications.  EXAM: STEREOTACTIC VACUUM ASSIST RIGHT  COMPARISON:  Previous exams.  FINDINGS: I met with the patient and we discussed the procedure of stereotactic-guided biopsy, including benefits and alternatives. We discussed the high likelihood of a successful procedure. We discussed the risks of the procedure, including infection, bleeding, tissue injury, clip migration, and inadequate sampling. Informed, written consent was given.  Using sterile technique and 2% Lidocaine as local anesthetic, under stereotactic guidance, a 9 gauge vacuum assisted device was used to perform core needle biopsy of calcifications within the right breast 1 o'clock position middle depth using a superior approach. Specimen radiograph was performed, showing calcifications. Specimens with calcifications are identified for pathology.  At the conclusion of the procedure, a top hat shaped tissue marker clip was deployed into the biopsy cavity. Follow-up 2-view mammogram confirmed clip in appropriate position.  IMPRESSION: Stereotactic-guided biopsy of right breast calcifications. No apparent complications.  Electronically Signed: By: Lovey Newcomer M.D. On: 04/09/2013 11:50     LABS:    Chemistry      Component Value Date/Time   NA 141 08/19/2013 1143   NA 142 05/19/2013 1050   K 3.6 08/19/2013 1143   K 3.9 05/19/2013 1050   CL 102 05/19/2013 1050   CO2 28 08/19/2013 1143   CO2 31 05/19/2013 1050   BUN 21.9 08/19/2013 1143   BUN 24* 05/19/2013 1050   CREATININE 1.0 08/19/2013 1143   CREATININE 1.27* 05/19/2013 1050      Component Value Date/Time   CALCIUM 10.1 08/19/2013 1143   CALCIUM 9.8 05/19/2013 1050   ALKPHOS 115 08/19/2013 1143   AST 28 08/19/2013 1143   ALT 27 08/19/2013 1143   BILITOT 0.57 08/19/2013 1143      Lab Results  Component Value Date   WBC 7.0 08/19/2013   HGB 12.5 08/19/2013    HCT 39.3 08/19/2013   MCV 93.2 08/19/2013   PLT 312 08/19/2013   PATHOLOGY: ADDITIONAL INFORMATION: PROGNOSTIC INDICATORS - ACIS Results: IMMUNOHISTOCHEMICAL AND MORPHOMETRIC ANALYSIS BY THE AUTOMATED CELLULAR IMAGING SYSTEM (ACIS) Estrogen Receptor: 100%, POSITIVE, STRONG STAINING INTENSITY Progesterone Receptor: 86%, POSITIVE, MODERATE STAINING INTENSITY REFERENCE RANGE ESTROGEN RECEPTOR NEGATIVE <1% POSITIVE =>1% PROGESTERONE RECEPTOR NEGATIVE <1% POSITIVE =>1% All  controls stained appropriately Claudette Laws MD Pathologist, Electronic Signature ( Signed 04/15/2013) FINAL DIAGNOSIS Diagnosis Breast, right, needle core biopsy, 1 o'clock - DUCTAL CARCINOMA IN SITU SEE COMMENT.. Microscopic Comment With these biopsies the in situ carcinoma is Grade II. Although there is angulation to the glands on the H&E slides, the corresponding slides taken for immunostain evaluation (smooth muscle myosin heavy chain, p63, and calponin) demonstrate preservation of the myoepithelial layer. Breast prognostic studies are pending and 1 of 2 FINAL for Kasa, Chrishonda [phone]) Microscopic Comment(continued) will be reported in an addendum. E-cadherin stain demonstrates strong diffuse expression; supporting ductal origin for the in situ carcinoma. (CRR:caf/gt 04/10/13) Mali RUND DO Pathologist, Electronic Signature  ASSESSMENT    78 year old female with  #1 new diagnosis of right DCIS that is ER positive PR positive. Patient also has an incidental finding of another lesion measuring 1.2 cm in the right breast.  Patient also was noted to have calcifications in the subareolar region of the left breast which could not be biopsied and will need needle localization and lumpectomy.  #2 On 05/26/13 patient underwent a left lumpectomy which revealed an intraductal papilloma with sclerosis and calcifications.  It was negative for atypia or malignancy.  She underwent a right lumpectomy that  revealed DCIS, with margins negative.  ER/PR positive.   PLAN:   #1 Casey Taylor is doing well today.  She is tolerating the Arimidex well and has no sign of recurrence.  We updated her health maintenance above.  I counseled her on survivorship.  She will continue arimidex.    #2 She will return in 6 months for follow up.    All questions were answered. The patient knows to call the clinic with any problems, questions or concerns. We can certainly see the patient much sooner if necessary.  I spent 25 minutes counseling the patient face to face. The total time spent in the appointment was 30 minutes.   Minette Headland, [place] [phone] 08/20/2013, 11:48 AM

## 2013-08-19 NOTE — Telephone Encounter (Signed)
, °

## 2013-08-19 NOTE — Patient Instructions (Signed)
You are doing well.  You have no sign of recurrence.  Continue taking Arimidex every day.  We will see you back in 6 months.   Please call us if you have any questions or concerns.

## 2013-08-20 ENCOUNTER — Telehealth: Payer: Self-pay | Admitting: *Deleted

## 2013-08-20 NOTE — Telephone Encounter (Signed)
Per NP request. Called pt to inform her of latest lab results. Pt was very pleased . Had no further concerns. Message to be forwarded to Charlestine Massed, NP.

## 2013-08-21 ENCOUNTER — Ambulatory Visit (INDEPENDENT_AMBULATORY_CARE_PROVIDER_SITE_OTHER): Payer: Medicare Other | Admitting: General Surgery

## 2013-08-21 VITALS — BP 118/74 | HR 72 | Temp 98.9°F | Resp 18 | Ht 62.0 in | Wt 187.0 lb

## 2013-08-21 DIAGNOSIS — C50219 Malignant neoplasm of upper-inner quadrant of unspecified female breast: Secondary | ICD-10-CM | POA: Diagnosis not present

## 2013-08-21 DIAGNOSIS — C50211 Malignant neoplasm of upper-inner quadrant of right female breast: Secondary | ICD-10-CM

## 2013-08-21 NOTE — Assessment & Plan Note (Signed)
No clinical evidence of disease.  Follow up with me in 3 months, so we can alternate visits with oncology.  Follow up mammogram in the fall.

## 2013-08-21 NOTE — Progress Notes (Signed)
HISTORY: Pt is a 78 yo F who is around 3 months s/p right needle loc partial mastectomy.  She is on arimedex.  She is not getting xrt due to age/grade of dcis.  She is tolerating arimedex well.  She had not found any additional problems in her breasts.  She denies fevers/chills.  She is getting around well.     PERTINENT REVIEW OF SYSTEMS: Otherwise negative x 11.    Filed Vitals:   08/21/13 1147  BP: 118/74  Pulse: 72  Temp: 98.9 F (37.2 C)  Resp: 18   Wt Readings from Last 3 Encounters:  08/21/13 187 lb (84.823 kg)  08/19/13 187 lb 8 oz (85.049 kg)  08/07/13 184 lb (83.462 kg)    EXAM: Head: Normocephalic and atraumatic.  Eyes:  Conjunctivae are normal. Pupils are equal, round, and reactive to light. No scleral icterus.  Neck:  Normal range of motion. Neck supple. No tracheal deviation present. No thyromegaly present.  Resp: No respiratory distress, normal effort. Breast:  Some indention at right upper inner scar.  Also some firm scar tissue at borders of cavity.  No palpable masses or nipple retraction.  No LAD.  Left breast with firmness at scar at lower outer quadrant at prior incision.  No abnormal findings.   Abd:  Abdomen is soft, non distended and non tender. No masses are palpable.  There is no rebound and no guarding.  Neurological: Alert and oriented to person, place, and time. Coordination normal.  Skin: Skin is warm and dry. No rash noted. No diaphoretic. No erythema. No pallor.  Psychiatric: Normal mood and affect. Normal behavior. Judgment and thought content normal.      ASSESSMENT AND PLAN:   Breast cancer of upper-inner quadrant of right female breast No clinical evidence of disease.  Follow up with me in 3 months, so we can alternate visits with oncology.  Follow up mammogram in the fall.        Milus Height, MD Surgical Oncology, [facility]  Salena Saner., MD Salena Saner.,  MD

## 2013-08-21 NOTE — Patient Instructions (Signed)
Continue arimedex  Follow up with me in 3 months, then Dr. Humphrey Rolls in 6 months.  Mammograms in the fall.

## 2013-09-24 DIAGNOSIS — H2510 Age-related nuclear cataract, unspecified eye: Secondary | ICD-10-CM | POA: Diagnosis not present

## 2013-09-24 DIAGNOSIS — H16229 Keratoconjunctivitis sicca, not specified as Sjogren's, unspecified eye: Secondary | ICD-10-CM | POA: Diagnosis not present

## 2013-10-28 DIAGNOSIS — Z0000 Encounter for general adult medical examination without abnormal findings: Secondary | ICD-10-CM | POA: Diagnosis not present

## 2013-10-28 DIAGNOSIS — E119 Type 2 diabetes mellitus without complications: Secondary | ICD-10-CM | POA: Diagnosis not present

## 2013-10-28 DIAGNOSIS — E559 Vitamin D deficiency, unspecified: Secondary | ICD-10-CM | POA: Diagnosis not present

## 2013-10-28 DIAGNOSIS — N183 Chronic kidney disease, stage 3 unspecified: Secondary | ICD-10-CM | POA: Diagnosis not present

## 2013-10-28 DIAGNOSIS — M109 Gout, unspecified: Secondary | ICD-10-CM | POA: Diagnosis not present

## 2013-10-28 DIAGNOSIS — F411 Generalized anxiety disorder: Secondary | ICD-10-CM | POA: Diagnosis not present

## 2013-10-28 DIAGNOSIS — I129 Hypertensive chronic kidney disease with stage 1 through stage 4 chronic kidney disease, or unspecified chronic kidney disease: Secondary | ICD-10-CM | POA: Diagnosis not present

## 2013-10-28 DIAGNOSIS — D0590 Unspecified type of carcinoma in situ of unspecified breast: Secondary | ICD-10-CM | POA: Diagnosis not present

## 2013-11-16 DIAGNOSIS — L2089 Other atopic dermatitis: Secondary | ICD-10-CM | POA: Diagnosis not present

## 2013-11-16 DIAGNOSIS — I129 Hypertensive chronic kidney disease with stage 1 through stage 4 chronic kidney disease, or unspecified chronic kidney disease: Secondary | ICD-10-CM | POA: Diagnosis not present

## 2013-11-16 DIAGNOSIS — N183 Chronic kidney disease, stage 3 unspecified: Secondary | ICD-10-CM | POA: Diagnosis not present

## 2013-11-16 DIAGNOSIS — Z79899 Other long term (current) drug therapy: Secondary | ICD-10-CM | POA: Diagnosis not present

## 2013-11-24 ENCOUNTER — Encounter (INDEPENDENT_AMBULATORY_CARE_PROVIDER_SITE_OTHER): Payer: Self-pay | Admitting: General Surgery

## 2013-11-24 ENCOUNTER — Ambulatory Visit (INDEPENDENT_AMBULATORY_CARE_PROVIDER_SITE_OTHER): Payer: Medicare Other | Admitting: General Surgery

## 2013-11-24 VITALS — BP 130/80 | HR 76 | Temp 97.8°F | Resp 18 | Ht 64.0 in | Wt 185.0 lb

## 2013-11-24 DIAGNOSIS — C50211 Malignant neoplasm of upper-inner quadrant of right female breast: Secondary | ICD-10-CM

## 2013-11-24 DIAGNOSIS — C50219 Malignant neoplasm of upper-inner quadrant of unspecified female breast: Secondary | ICD-10-CM

## 2013-11-24 NOTE — Patient Instructions (Signed)
Follow up with me in 6 months.    Get mammogram around that time and forward to me.  They should call you, if not, let us know and we will set up.

## 2013-11-24 NOTE — Progress Notes (Signed)
HISTORY: Pt is a 78 yo F who is s/p right needle loc partial mastectomy for DCIS 05/26/2013.  She is tolerating arimedex.  She has had a sarcoid flare and had to take a prednisone taper.  She has no other new health problems or issues with her breasts.     PERTINENT REVIEW OF SYSTEMS: Otherwise negative x 11.    Filed Vitals:   11/24/13 1019  BP: 130/80  Pulse: 76  Temp: 97.8 F (36.6 C)  Resp: 18   Wt Readings from Last 3 Encounters:  11/24/13 185 lb (83.915 kg)  08/21/13 187 lb (84.823 kg)  08/19/13 187 lb 8 oz (85.049 kg)    EXAM: Head: Normocephalic and atraumatic.  Eyes:  Conjunctivae are normal. Pupils are equal, round, and reactive to light. No scleral icterus.  Neck:  Normal range of motion. Neck supple. No tracheal deviation present. No thyromegaly present.  Resp: No respiratory distress, normal effort. Breast:  No change in exam.  Some indention at right upper inner scar.  Also some firm scar tissue at borders of cavity.  No palpable masses or nipple retraction.  No LAD.  Left breast with firmness at scar at lower outer quadrant at prior incision.  No abnormal findings.  Some dense breast tissue, especially on left.   Abd:  Abdomen is soft, non distended and non tender. No masses are palpable.  There is no rebound and no guarding.  Neurological: Alert and oriented to person, place, and time. Coordination normal.  Skin: Skin is warm and dry. No rash noted. No diaphoretic. No erythema. No pallor.  Psychiatric: Normal mood and affect. Normal behavior. Judgment and thought content normal.      ASSESSMENT AND PLAN:   Breast cancer of upper-inner quadrant of right female breast No clinical evidence of disease.  Continue arimedex.  Follow up with oncology in 3 months.   Follow up with me in 6 months.         Milus Height, MD Surgical Oncology, [facility]  Salena Saner., MD Salena Saner., MD

## 2013-11-24 NOTE — Assessment & Plan Note (Signed)
No clinical evidence of disease.  Continue arimedex.  Follow up with oncology in 3 months.   Follow up with me in 6 months.

## 2013-11-27 ENCOUNTER — Ambulatory Visit (INDEPENDENT_AMBULATORY_CARE_PROVIDER_SITE_OTHER): Payer: Medicare Other | Admitting: General Surgery

## 2013-12-22 DIAGNOSIS — L280 Lichen simplex chronicus: Secondary | ICD-10-CM | POA: Diagnosis not present

## 2014-01-30 ENCOUNTER — Telehealth: Payer: Self-pay | Admitting: Hematology and Oncology

## 2014-01-30 NOTE — Telephone Encounter (Signed)
, °

## 2014-02-17 ENCOUNTER — Other Ambulatory Visit: Payer: Medicare Other

## 2014-02-17 ENCOUNTER — Ambulatory Visit: Payer: Medicare Other | Admitting: Oncology

## 2014-02-23 ENCOUNTER — Other Ambulatory Visit: Payer: Self-pay | Admitting: Dermatology

## 2014-02-23 DIAGNOSIS — L259 Unspecified contact dermatitis, unspecified cause: Secondary | ICD-10-CM | POA: Diagnosis not present

## 2014-02-23 DIAGNOSIS — L280 Lichen simplex chronicus: Secondary | ICD-10-CM | POA: Diagnosis not present

## 2014-02-23 DIAGNOSIS — D485 Neoplasm of uncertain behavior of skin: Secondary | ICD-10-CM | POA: Diagnosis not present

## 2014-02-26 ENCOUNTER — Other Ambulatory Visit: Payer: Self-pay | Admitting: Podiatry

## 2014-03-02 ENCOUNTER — Other Ambulatory Visit: Payer: Self-pay

## 2014-03-02 DIAGNOSIS — C50211 Malignant neoplasm of upper-inner quadrant of right female breast: Secondary | ICD-10-CM

## 2014-03-03 ENCOUNTER — Ambulatory Visit (HOSPITAL_BASED_OUTPATIENT_CLINIC_OR_DEPARTMENT_OTHER): Payer: Medicare Other | Admitting: Hematology and Oncology

## 2014-03-03 ENCOUNTER — Encounter: Payer: Self-pay | Admitting: Hematology and Oncology

## 2014-03-03 ENCOUNTER — Telehealth: Payer: Self-pay | Admitting: Hematology and Oncology

## 2014-03-03 ENCOUNTER — Other Ambulatory Visit (HOSPITAL_BASED_OUTPATIENT_CLINIC_OR_DEPARTMENT_OTHER): Payer: Medicare Other

## 2014-03-03 VITALS — BP 148/49 | HR 52 | Temp 98.2°F | Resp 20 | Ht 64.0 in | Wt 183.8 lb

## 2014-03-03 DIAGNOSIS — C50211 Malignant neoplasm of upper-inner quadrant of right female breast: Secondary | ICD-10-CM

## 2014-03-03 DIAGNOSIS — Z853 Personal history of malignant neoplasm of breast: Secondary | ICD-10-CM | POA: Diagnosis not present

## 2014-03-03 LAB — CBC WITH DIFFERENTIAL/PLATELET
BASO%: 0.9 % (ref 0.0–2.0)
Basophils Absolute: 0.1 10*3/uL (ref 0.0–0.1)
EOS ABS: 0.4 10*3/uL (ref 0.0–0.5)
EOS%: 6.2 % (ref 0.0–7.0)
HCT: 39.1 % (ref 34.8–46.6)
HGB: 12.8 g/dL (ref 11.6–15.9)
LYMPH%: 31.6 % (ref 14.0–49.7)
MCH: 30.5 pg (ref 25.1–34.0)
MCHC: 32.8 g/dL (ref 31.5–36.0)
MCV: 92.9 fL (ref 79.5–101.0)
MONO#: 0.5 10*3/uL (ref 0.1–0.9)
MONO%: 8.2 % (ref 0.0–14.0)
NEUT%: 53.1 % (ref 38.4–76.8)
NEUTROS ABS: 3.3 10*3/uL (ref 1.5–6.5)
Platelets: 273 10*3/uL (ref 145–400)
RBC: 4.21 10*6/uL (ref 3.70–5.45)
RDW: 12.8 % (ref 11.2–14.5)
WBC: 6.2 10*3/uL (ref 3.9–10.3)
lymph#: 1.9 10*3/uL (ref 0.9–3.3)

## 2014-03-03 LAB — COMPREHENSIVE METABOLIC PANEL (CC13)
ALBUMIN: 3.8 g/dL (ref 3.5–5.0)
ALT: 15 U/L (ref 0–55)
AST: 19 U/L (ref 5–34)
Alkaline Phosphatase: 95 U/L (ref 40–150)
Anion Gap: 11 mEq/L (ref 3–11)
BUN: 24.6 mg/dL (ref 7.0–26.0)
CO2: 27 mEq/L (ref 22–29)
Calcium: 9.6 mg/dL (ref 8.4–10.4)
Chloride: 104 mEq/L (ref 98–109)
Creatinine: 1.2 mg/dL — ABNORMAL HIGH (ref 0.6–1.1)
GLUCOSE: 107 mg/dL (ref 70–140)
Potassium: 3.9 mEq/L (ref 3.5–5.1)
SODIUM: 141 meq/L (ref 136–145)
Total Bilirubin: 0.55 mg/dL (ref 0.20–1.20)
Total Protein: 7 g/dL (ref 6.4–8.3)

## 2014-03-03 MED ORDER — ANASTROZOLE 1 MG PO TABS: 1.0000 mg | ORAL_TABLET | Freq: Every day | ORAL | Status: DC

## 2014-03-03 NOTE — Assessment & Plan Note (Signed)
Right breast DCIS ER/PR positive status post lumpectomy currently on adjuvant antiestrogen therapy with Arimidex. Patient will need to be set up for mammograms for surveillance in October 2015. Today's breast exam revealed no major abnormalities. If her mammogram comes back normal I will see her back in 6 months for followup.  Monitoring toxicities to antiestrogen therapy. She will need mammograms in October. For the muscle aches and pains I recommended she can take Claritin-D as we have seen benefit to Claritin-D inpatients with myalgias related to antiestrogen therapy.  Discussed the importance of physical exercise in decreasing the likelihood of breast cancer recurrence. Recommended 30 mins daily 6 days a week of either brisk walking or cycling or swimming. Encouraged patient to eat more fruits and vegetables and decrease red meat.

## 2014-03-03 NOTE — Progress Notes (Signed)
Patient Care Team: Willey Blade, MD as PCP - General (Internal Medicine)  DIAGNOSIS: Breast cancer of upper-inner quadrant of right female breast   Primary site: Breast (Right)   Staging method: AJCC 7th Edition   Clinical: Stage 0 (Tis (DCIS), N0, cM0)   Summary: Stage 0 (Tis (DCIS), N0, cM0)   Clinical comments: Staged at breast conference 11.5.14   SUMMARY OF ONCOLOGIC HISTORY:   Breast cancer of upper-inner quadrant of right female breast   04/16/2013 Initial Diagnosis Breast cancer of upper-inner quadrant of right female breast: Low grade DCIS ER/PR positive   05/26/2013 Surgery Right Breast lumpectomy which revealed intra-ductal papilloma with calcification; DCIS, ER/PR Positive, margins neg   07/02/2013 -  Anti-estrogen oral therapy Arimidex 1 mg daily    CHIEF COMPLIANT: Six-month followup of history of DCIS.  INTERVAL HISTORY: Casey Taylor is a 78 year old Caucasian lady with above-mentioned history of Right sided DCIS treated with lumpectomy. She did not receive radiation therapy because the lesion was so small it was felt that she did not need radiation. She is currently on Arimidex 1 mg daily started January 2015. She is tolerating it very well without any major problems or concerns. She will need to be set up for mammograms in October 2015. She does not report any new problems or concerns with her breast. She is previously retired from [employer] ([employer])  REVIEW OF SYSTEMS:   Constitutional: Denies fevers, chills or abnormal weight loss Eyes: Denies blurriness of vision Ears, nose, mouth, throat, and face: Denies mucositis or sore throat Respiratory: Denies cough, dyspnea or wheezes Cardiovascular: Denies palpitation, chest discomfort or lower extremity swelling Gastrointestinal:  Denies nausea, heartburn or change in bowel habits Skin: Denies abnormal skin rashes Lymphatics: Denies new lymphadenopathy or easy bruising Neurological:Denies numbness,  tingling or new weaknesses Behavioral/Psych: Mood is stable, no new changes  Breast:  denies any pain or lumps or nodules in either breasts All other systems were reviewed with the patient and are negative.  I have reviewed the past medical history, past surgical history, social history and family history with the patient and they are unchanged from previous note.  ALLERGIES:  is allergic to codeine.  MEDICATIONS:  Current Outpatient Prescriptions  Medication Sig Dispense Refill  . allopurinol (ZYLOPRIM) 100 MG tablet Take 1 tablet (100 mg total) by mouth 2 (two) times daily.  60 tablet  2  . ALPRAZolam (XANAX) 0.25 MG tablet Take 0.25 mg by mouth at bedtime as needed for anxiety.      Marland Kitchen anastrozole (ARIMIDEX) 1 MG tablet Take 1 tablet (1 mg total) by mouth daily.  90 tablet  3  . aspirin 81 MG tablet Take 81 mg by mouth daily.      Marland Kitchen atenolol-chlorthalidone (TENORETIC) 50-25 MG per tablet Take 1 tablet by mouth daily.      . Cholecalciferol (VITAMIN D PO) Take 2,000 Int'l Units by mouth daily.      . colchicine 0.6 MG tablet Take 0.6 mg by mouth daily.      . Magnesium 250 MG TABS Take 1 tablet by mouth daily.      . meloxicam (MOBIC) 15 MG tablet Take 15 mg by mouth daily.      . naproxen sodium (ALEVE) 220 MG tablet Take 2 tablets every 12 hours for toe pain.      . potassium gluconate 595 MG TABS tablet Take 595 mg by mouth daily.       No current facility-administered medications for  this visit.    PHYSICAL EXAMINATION: ECOG PERFORMANCE STATUS: 0 - Asymptomatic  Filed Vitals:   03/03/14 1044  BP: 148/49  Pulse: 52  Temp: 98.2 F (36.8 C)  Resp: 20   Filed Weights   03/03/14 1044  Weight: 183 lb 12.8 oz (83.371 kg)    GENERAL:alert, no distress and comfortable SKIN: skin color, texture, turgor are normal, no rashes or significant lesions EYES: normal, Conjunctiva are pink and non-injected, sclera clear OROPHARYNX:no exudate, no erythema and lips, buccal mucosa, and  tongue normal  NECK: supple, thyroid normal size, non-tender, without nodularity LYMPH:  no palpable lymphadenopathy in the cervical, axillary or inguinal LUNGS: clear to auscultation and percussion with normal breathing effort HEART: regular rate & rhythm and no murmurs and no lower extremity edema ABDOMEN:abdomen soft, non-tender and normal bowel sounds Musculoskeletal:no cyanosis of digits and no clubbing  NEURO: alert & oriented x 3 with fluent speech, no focal motor/sensory deficits BREAST: No palpable masses or nodules in either right or left breasts. No palpable axillary supraclavicular or infraclavicular adenopathy no breast tenderness or nipple discharge.   LABORATORY DATA:  I have reviewed the data as listed   Chemistry      Component Value Date/Time   NA 141 03/03/2014 1000   NA 142 05/19/2013 1050   K 3.9 03/03/2014 1000   K 3.9 05/19/2013 1050   CL 102 05/19/2013 1050   CO2 27 03/03/2014 1000   CO2 31 05/19/2013 1050   BUN 24.6 03/03/2014 1000   BUN 24* 05/19/2013 1050   CREATININE 1.2* 03/03/2014 1000   CREATININE 1.27* 05/19/2013 1050      Component Value Date/Time   CALCIUM 9.6 03/03/2014 1000   CALCIUM 9.8 05/19/2013 1050   ALKPHOS 95 03/03/2014 1000   AST 19 03/03/2014 1000   ALT 15 03/03/2014 1000   BILITOT 0.55 03/03/2014 1000       Lab Results  Component Value Date   WBC 6.2 03/03/2014   HGB 12.8 03/03/2014   HCT 39.1 03/03/2014   MCV 92.9 03/03/2014   PLT 273 03/03/2014   NEUTROABS 3.3 03/03/2014     RADIOGRAPHIC STUDIES: I have personally reviewed the radiology reports and agreed with their findings. No results found.   ASSESSMENT & PLAN:  Breast cancer of upper-inner quadrant of right female breast Right breast DCIS ER/PR positive status post lumpectomy currently on adjuvant antiestrogen therapy with Arimidex. Patient will need to be set up for mammograms for surveillance in October 2015. Today's breast exam revealed no major abnormalities. If her mammogram  comes back normal I will see her back in 6 months for followup.  Monitoring toxicities to antiestrogen therapy. She will need mammograms in October. For the muscle aches and pains I recommended she can take Claritin-D as we have seen benefit to Claritin-D inpatients with myalgias related to antiestrogen therapy.  Discussed the importance of physical exercise in decreasing the likelihood of breast cancer recurrence. Recommended 30 mins daily 6 days a week of either brisk walking or cycling or swimming. Encouraged patient to eat more fruits and vegetables and decrease red meat.     No orders of the defined types were placed in this encounter.   The patient has a good understanding of the overall plan. she agrees with it. She will call with any problems that may develop before her next visit here.  I spent 20 minutes counseling the patient face to face. The total time spent in the appointment was 25 minutes and  more than 50% was on counseling and review of test results    Rulon Eisenmenger, MD 03/03/2014 10:59 AM

## 2014-03-03 NOTE — Telephone Encounter (Signed)
sch pt appt-will sch mamma & call pt w/appt

## 2014-03-11 ENCOUNTER — Other Ambulatory Visit: Payer: Medicare Other

## 2014-04-02 ENCOUNTER — Ambulatory Visit
Admission: RE | Admit: 2014-04-02 | Discharge: 2014-04-02 | Disposition: A | Discharge status: Home / Self Care | Payer: Medicare Other | Source: Ambulatory Visit | Attending: Hematology and Oncology | Admitting: Hematology and Oncology

## 2014-04-02 DIAGNOSIS — Z853 Personal history of malignant neoplasm of breast: Secondary | ICD-10-CM | POA: Diagnosis not present

## 2014-04-02 DIAGNOSIS — C50211 Malignant neoplasm of upper-inner quadrant of right female breast: Secondary | ICD-10-CM

## 2014-04-13 DIAGNOSIS — I129 Hypertensive chronic kidney disease with stage 1 through stage 4 chronic kidney disease, or unspecified chronic kidney disease: Secondary | ICD-10-CM | POA: Diagnosis not present

## 2014-04-13 DIAGNOSIS — N183 Chronic kidney disease, stage 3 (moderate): Secondary | ICD-10-CM | POA: Diagnosis not present

## 2014-04-13 DIAGNOSIS — Z79899 Other long term (current) drug therapy: Secondary | ICD-10-CM | POA: Diagnosis not present

## 2014-04-27 DIAGNOSIS — J209 Acute bronchitis, unspecified: Secondary | ICD-10-CM | POA: Diagnosis not present

## 2014-06-22 ENCOUNTER — Emergency Department (HOSPITAL_BASED_OUTPATIENT_CLINIC_OR_DEPARTMENT_OTHER)
Admission: EM | Admit: 2014-06-22 | Discharge: 2014-06-22 | Disposition: A | Discharge status: Home / Self Care | Payer: Medicare Other | Attending: Emergency Medicine | Admitting: Emergency Medicine

## 2014-06-22 ENCOUNTER — Encounter (HOSPITAL_BASED_OUTPATIENT_CLINIC_OR_DEPARTMENT_OTHER): Payer: Self-pay | Admitting: Emergency Medicine

## 2014-06-22 DIAGNOSIS — Z8673 Personal history of transient ischemic attack (TIA), and cerebral infarction without residual deficits: Secondary | ICD-10-CM | POA: Diagnosis not present

## 2014-06-22 DIAGNOSIS — Z853 Personal history of malignant neoplasm of breast: Secondary | ICD-10-CM | POA: Diagnosis not present

## 2014-06-22 DIAGNOSIS — Z79899 Other long term (current) drug therapy: Secondary | ICD-10-CM | POA: Insufficient documentation

## 2014-06-22 DIAGNOSIS — M1990 Unspecified osteoarthritis, unspecified site: Secondary | ICD-10-CM | POA: Insufficient documentation

## 2014-06-22 DIAGNOSIS — Z7982 Long term (current) use of aspirin: Secondary | ICD-10-CM | POA: Insufficient documentation

## 2014-06-22 DIAGNOSIS — F419 Anxiety disorder, unspecified: Secondary | ICD-10-CM | POA: Insufficient documentation

## 2014-06-22 DIAGNOSIS — I10 Essential (primary) hypertension: Secondary | ICD-10-CM | POA: Insufficient documentation

## 2014-06-22 DIAGNOSIS — M5412 Radiculopathy, cervical region: Secondary | ICD-10-CM | POA: Diagnosis not present

## 2014-06-22 DIAGNOSIS — M109 Gout, unspecified: Secondary | ICD-10-CM | POA: Diagnosis not present

## 2014-06-22 DIAGNOSIS — M79602 Pain in left arm: Secondary | ICD-10-CM | POA: Diagnosis present

## 2014-06-22 MED ORDER — GABAPENTIN 300 MG PO CAPS: 300.0000 mg | ORAL_CAPSULE | Freq: Three times a day (TID) | ORAL | Status: DC

## 2014-06-22 MED ORDER — MELOXICAM 15 MG PO TABS: 15.0000 mg | ORAL_TABLET | Freq: Every day | ORAL | Status: DC

## 2014-06-22 NOTE — ED Provider Notes (Signed)
CSN: [id number]     Arrival date & time 06/22/14  1214 History   First MD Initiated Contact with Patient 06/22/14 1233     Chief Complaint  Patient presents with  . Arm Pain     HPI Patient reports constant tingling in her left upper extremity with shooting pain coming from her left trapezius region down into her left arm all the way to her fingertips.  She denies new weakness in her arms.  No significant neck pain.  She reports she's been dealing with this over the past 2 weeks without significant improvement.  She called her primary care team who recommended that she come to the ER for evaluation.  She does have a history of breast cancer in remission.  She denies breast pain.  No recent weight changes or weight loss.    Past Medical History  Diagnosis Date  . Breast cancer   . Anxiety   . Arthritis   . Gout   . Stroke     tia    06  . Hypertension     dr Karlton Lemon   Past Surgical History  Procedure Laterality Date  . Abdominal hysterectomy    . Cholecystectomy    . Breast biopsy Left 05/26/2013    Procedure: BREAST BIOPSY WITH NEEDLE LOCALIZATION;  Surgeon: Stark Klein, MD;  Location: [facility];  Service: General;  Laterality: Left;  . Partial mastectomy with needle localization Right 05/26/2013    Procedure: PARTIAL MASTECTOMY WITH NEEDLE LOCALIZATION;  Surgeon: Stark Klein, MD;  Location: [facility];  Service: General;  Laterality: Right;   No family history on file. History  Substance Use Topics  . Smoking status: Never Smoker   . Smokeless tobacco: Never Used  . Alcohol Use: Yes     Comment: weekly   OB History    No data available     Review of Systems  All other systems reviewed and are negative.     Allergies  Codeine  Home Medications   Prior to Admission medications   Medication Sig Start Date End Date Taking? Authorizing Provider  allopurinol (ZYLOPRIM) 100 MG tablet Take 1 tablet (100 mg total) by mouth 2 (two) times daily. 08/07/13   Myeong Sheard, DPM   ALPRAZolam (XANAX) 0.25 MG tablet Take 0.25 mg by mouth at bedtime as needed for anxiety.    Historical Provider, MD  anastrozole (ARIMIDEX) 1 MG tablet Take 1 tablet (1 mg total) by mouth daily. 03/03/14   Rulon Eisenmenger, MD  aspirin 81 MG tablet Take 81 mg by mouth daily.    Historical Provider, MD  atenolol-chlorthalidone (TENORETIC) 50-25 MG per tablet Take 1 tablet by mouth daily.    Historical Provider, MD  Cholecalciferol (VITAMIN D PO) Take 2,000 Int'l Units by mouth daily.    Historical Provider, MD  colchicine 0.6 MG tablet Take 0.6 mg by mouth daily.    Historical Provider, MD  gabapentin (NEURONTIN) 300 MG capsule Take 1 capsule (300 mg total) by mouth 3 (three) times daily. 06/22/14   Hoy Morn, MD  Magnesium 250 MG TABS Take 1 tablet by mouth daily.    Historical Provider, MD  meloxicam (MOBIC) 15 MG tablet Take 1 tablet (15 mg total) by mouth daily. 06/22/14   Hoy Morn, MD  potassium gluconate 595 MG TABS tablet Take 595 mg by mouth daily.    Historical Provider, MD   BP 137/71 mmHg  Pulse 62  Temp(Src) 97.8 F (36.6 C) (Oral)  Resp 16  Ht 5\' 2"  (1.575 m)  Wt 186 lb (84.369 kg)  BMI 34.01 kg/m2  SpO2 98% Physical Exam  Constitutional: She is oriented to person, place, and time. She appears well-developed and well-nourished. No distress.  HENT:  Head: Normocephalic and atraumatic.  Eyes: EOM are normal.  Neck: Normal range of motion.  No cervical spine tenderness.  Cardiovascular: Normal rate, regular rhythm and normal heart sounds.   Pulmonary/Chest: Effort normal and breath sounds normal.  Abdominal: Soft. She exhibits no distension. There is no tenderness.  Musculoskeletal: Normal range of motion.  5 out of 5 strength of bilateral upper extremity major muscle groups.  Full range of motion of left wrist, left elbow, left shoulder.  No swelling of the left arm as compared to the right.  Normal left radial pulse.  Neurological: She is alert and oriented to  person, place, and time.  Skin: Skin is warm and dry.  Psychiatric: She has a normal mood and affect. Judgment normal.  Nursing note and vitals reviewed.   ED Course  Procedures (including critical care time) Labs Review Labs Reviewed - No data to display  Imaging Review No results found.   EKG Interpretation None      MDM   Final diagnoses:  Left cervical radiculopathy    Possible left sided cervical radiculopathy versus spasm of the left trapezius muscle with nerve entrapment.  She has a normal left radial pulse.  Doubt arterial problem.  Doubt DVT.  No swelling of the left upper extremity.  She has normal strength in that left arm.  She'll be sent home with a short course of anti-inflammatories as well as gabapentin.  Primary care follow-up.  She continues to have these symptoms she'll likely need an MRI scan of her cervical spine.  She also may benefit from EMG testing as an outpatient. no indication for additional treatment in the emergency department.  No indication for advanced imaging today.  She understands the importance of close primary care follow-up for this however.  I have asked that she return to the ER for any new or worsening symptoms     Hoy Morn, MD 06/22/14 1330

## 2014-06-22 NOTE — Discharge Instructions (Signed)

## 2014-06-22 NOTE — ED Notes (Signed)
78 yo with c/o left arm pain and tingling starting 2 weeks ago. Pain has recently gotten worse. Pt has Full ROM.

## 2014-06-24 ENCOUNTER — Ambulatory Visit
Admission: RE | Admit: 2014-06-24 | Discharge: 2014-06-24 | Disposition: A | Discharge status: Home / Self Care | Payer: Medicare Other | Source: Ambulatory Visit | Attending: Family | Admitting: Family

## 2014-06-24 ENCOUNTER — Other Ambulatory Visit: Payer: Self-pay | Admitting: Family

## 2014-06-24 DIAGNOSIS — M25512 Pain in left shoulder: Secondary | ICD-10-CM | POA: Diagnosis not present

## 2014-06-24 DIAGNOSIS — M5412 Radiculopathy, cervical region: Secondary | ICD-10-CM | POA: Diagnosis not present

## 2014-06-24 DIAGNOSIS — M5032 Other cervical disc degeneration, mid-cervical region: Secondary | ICD-10-CM | POA: Diagnosis not present

## 2014-06-28 DIAGNOSIS — C50411 Malignant neoplasm of upper-outer quadrant of right female breast: Secondary | ICD-10-CM | POA: Diagnosis not present

## 2014-07-27 DIAGNOSIS — Z6833 Body mass index (BMI) 33.0-33.9, adult: Secondary | ICD-10-CM | POA: Diagnosis not present

## 2014-09-06 ENCOUNTER — Other Ambulatory Visit: Payer: Self-pay

## 2014-09-06 ENCOUNTER — Ambulatory Visit: Payer: Medicare Other | Admitting: Hematology and Oncology

## 2014-09-07 ENCOUNTER — Telehealth: Payer: Self-pay | Admitting: Hematology and Oncology

## 2014-09-07 NOTE — Telephone Encounter (Signed)
Called and left message with new appointment per pof  anne

## 2014-09-27 DIAGNOSIS — H43811 Vitreous degeneration, right eye: Secondary | ICD-10-CM | POA: Diagnosis not present

## 2014-09-27 DIAGNOSIS — H2513 Age-related nuclear cataract, bilateral: Secondary | ICD-10-CM | POA: Diagnosis not present

## 2014-09-27 DIAGNOSIS — H16223 Keratoconjunctivitis sicca, not specified as Sjogren's, bilateral: Secondary | ICD-10-CM | POA: Diagnosis not present

## 2014-10-11 ENCOUNTER — Telehealth: Payer: Self-pay | Admitting: Hematology and Oncology

## 2014-10-11 ENCOUNTER — Ambulatory Visit (HOSPITAL_BASED_OUTPATIENT_CLINIC_OR_DEPARTMENT_OTHER): Payer: Medicare Other | Admitting: Hematology and Oncology

## 2014-10-11 VITALS — BP 144/61 | HR 59 | Temp 98.1°F | Resp 18 | Ht 62.0 in | Wt 189.5 lb

## 2014-10-11 DIAGNOSIS — M25562 Pain in left knee: Secondary | ICD-10-CM

## 2014-10-11 DIAGNOSIS — D0511 Intraductal carcinoma in situ of right breast: Secondary | ICD-10-CM | POA: Diagnosis not present

## 2014-10-11 DIAGNOSIS — C50211 Malignant neoplasm of upper-inner quadrant of right female breast: Secondary | ICD-10-CM

## 2014-10-11 NOTE — Assessment & Plan Note (Signed)
Right breast DCIS with calcification status post lumpectomy 05/26/2013 ER/PR positive margins negative, did not receive radiation because the tumor size was so small, started anastrozole 1 mg daily 07/02/2013  Anastrozole toxicities:  Breast Cancer Surveillance: 1. Breast exam 10/11/2014: Normal 2. Mammogram 04/02/2014 No abnormalities. Postsurgical changes. Breast Density Category B. I recommended that she get 3-D mammograms for surveillance. Discussed the differences between different breast density categories.

## 2014-10-11 NOTE — Telephone Encounter (Signed)
per pof to sch pt appt-gave pty copy of sch

## 2014-10-11 NOTE — Progress Notes (Signed)
Patient Care Team: Willey Blade, MD as PCP - General (Internal Medicine)  DIAGNOSIS: Breast cancer of upper-inner quadrant of right female breast   Staging form: Breast, AJCC 7th Edition     Clinical: Stage 0 (Tis (DCIS), N0, cM0) - Unsigned       Staging comments: Staged at breast conference 11.5.14      Pathologic: No stage assigned - Unsigned   SUMMARY OF ONCOLOGIC HISTORY:   Breast cancer of upper-inner quadrant of right female breast   04/16/2013 Initial Diagnosis Breast cancer of upper-inner quadrant of right female breast: Low grade DCIS ER/PR positive   05/26/2013 Surgery Right Breast lumpectomy which revealed intra-ductal papilloma with calcification; DCIS, ER/PR Positive, margins neg   07/02/2013 -  Anti-estrogen oral therapy Arimidex 1 mg daily    CHIEF COMPLIANT: follow-up on Arimidex, complains of left knee arthritis  INTERVAL HISTORY: Casey Taylor is a 79 year old lady with above-mentioned history of right breast cancer treated with lumpectomy and is currently on Arimidex. She is tolerating Arimidex fairly well denies any hot flashes. Complains of pain in the legs especially her left knee which is osteoarthritis. She also has left neck and arm pain for which she is on gabapentin.  REVIEW OF SYSTEMS:   Constitutional: Denies fevers, chills or abnormal weight loss Eyes: Denies blurriness of vision Ears, nose, mouth, throat, and face: Denies mucositis or sore throat Respiratory: Denies cough, dyspnea or wheezes Cardiovascular: Denies palpitation, chest discomfort or lower extremity swelling Gastrointestinal:  Denies nausea, heartburn or change in bowel habits Skin: Denies abnormal skin rashes Lymphatics: Denies new lymphadenopathy or easy bruising Neurological:left neck and arm pain. Behavioral/Psych: Mood is stable, no new changes  Breast:  denies any pain or lumps or nodules in either breasts All other systems were reviewed with the patient and are  negative.  I have reviewed the past medical history, past surgical history, social history and family history with the patient and they are unchanged from previous note.  ALLERGIES:  is allergic to codeine.  MEDICATIONS:  Current Outpatient Prescriptions  Medication Sig Dispense Refill  . allopurinol (ZYLOPRIM) 100 MG tablet Take 1 tablet (100 mg total) by mouth 2 (two) times daily. 60 tablet 2  . ALPRAZolam (XANAX) 0.25 MG tablet Take 0.25 mg by mouth at bedtime as needed for anxiety.    Marland Kitchen anastrozole (ARIMIDEX) 1 MG tablet Take 1 tablet (1 mg total) by mouth daily. 90 tablet 3  . aspirin 81 MG tablet Take 81 mg by mouth daily.    Marland Kitchen atenolol-chlorthalidone (TENORETIC) 50-25 MG per tablet Take 1 tablet by mouth daily.    . Cholecalciferol (VITAMIN D PO) Take 2,000 Int'l Units by mouth daily.    . colchicine 0.6 MG tablet Take 0.6 mg by mouth daily.    Marland Kitchen gabapentin (NEURONTIN) 300 MG capsule Take 1 capsule (300 mg total) by mouth 3 (three) times daily. 60 capsule 0  . Magnesium 250 MG TABS Take 1 tablet by mouth daily.    . meloxicam (MOBIC) 15 MG tablet Take 1 tablet (15 mg total) by mouth daily. 15 tablet 0  . potassium gluconate 595 MG TABS tablet Take 595 mg by mouth daily.     No current facility-administered medications for this visit.    PHYSICAL EXAMINATION: ECOG PERFORMANCE STATUS: 1 - Symptomatic but completely ambulatory  Filed Vitals:   10/11/14 1158  BP: 144/61  Pulse: 59  Temp: 98.1 F (36.7 C)  Resp: 18   Filed Weights   10/11/14  1158  Weight: 189 lb 8 oz (85.957 kg)    GENERAL:alert, no distress and comfortable SKIN: skin color, texture, turgor are normal, no rashes or significant lesions EYES: normal, Conjunctiva are pink and non-injected, sclera clear OROPHARYNX:no exudate, no erythema and lips, buccal mucosa, and tongue normal  NECK: supple, thyroid normal size, non-tender, without nodularity LYMPH:  no palpable lymphadenopathy in the cervical, axillary  or inguinal LUNGS: clear to auscultation and percussion with normal breathing effort HEART: regular rate & rhythm and no murmurs and no lower extremity edema ABDOMEN:abdomen soft, non-tender and normal bowel sounds Musculoskeletal:no cyanosis of digits and no clubbing  NEURO: alert & oriented x 3 with fluent speech, no focal motor/sensory deficits BREAST: No palpable masses or nodules in either right or left breasts. No palpable axillary supraclavicular or infraclavicular adenopathy no breast tenderness or nipple discharge. (exam performed in the presence of a chaperone)  LABORATORY DATA:  I have reviewed the data as listed   Chemistry      Component Value Date/Time   NA 141 03/03/2014 1000   NA 142 05/19/2013 1050   K 3.9 03/03/2014 1000   K 3.9 05/19/2013 1050   CL 102 05/19/2013 1050   CO2 27 03/03/2014 1000   CO2 31 05/19/2013 1050   BUN 24.6 03/03/2014 1000   BUN 24* 05/19/2013 1050   CREATININE 1.2* 03/03/2014 1000   CREATININE 1.27* 05/19/2013 1050      Component Value Date/Time   CALCIUM 9.6 03/03/2014 1000   CALCIUM 9.8 05/19/2013 1050   ALKPHOS 95 03/03/2014 1000   AST 19 03/03/2014 1000   ALT 15 03/03/2014 1000   BILITOT 0.55 03/03/2014 1000       Lab Results  Component Value Date   WBC 6.2 03/03/2014   HGB 12.8 03/03/2014   HCT 39.1 03/03/2014   MCV 92.9 03/03/2014   PLT 273 03/03/2014   NEUTROABS 3.3 03/03/2014    ASSESSMENT & PLAN:  Breast cancer of upper-inner quadrant of right female breast Right breast DCIS with calcification status post lumpectomy 05/26/2013 ER/PR positive margins negative, did not receive radiation because the tumor size was so small, started anastrozole 1 mg daily 07/02/2013  Anastrozole toxicities: 1. Musculoskeletal aches and pains in both lower extremities 2. Bone density to be done in one month by her primary care physician, previous bone density 2013 was normal  Breast Cancer Surveillance: 1. Breast exam 10/11/2014:  Normal 2. Mammogram 04/02/2014 No abnormalities. Postsurgical changes. Breast Density Category B. I recommended that she get 3-D mammograms for surveillance. Discussed the differences between different breast density categories.   No orders of the defined types were placed in this encounter.   The patient has a good understanding of the overall plan. she agrees with it. She will call with any problems that may develop before her next visit here.   Rulon Eisenmenger, MD

## 2014-10-13 DIAGNOSIS — M25562 Pain in left knee: Secondary | ICD-10-CM | POA: Diagnosis not present

## 2014-10-13 DIAGNOSIS — Z79899 Other long term (current) drug therapy: Secondary | ICD-10-CM | POA: Diagnosis not present

## 2014-10-13 DIAGNOSIS — I10 Essential (primary) hypertension: Secondary | ICD-10-CM | POA: Diagnosis not present

## 2014-10-13 DIAGNOSIS — M1712 Unilateral primary osteoarthritis, left knee: Secondary | ICD-10-CM | POA: Diagnosis not present

## 2014-10-28 DIAGNOSIS — M1712 Unilateral primary osteoarthritis, left knee: Secondary | ICD-10-CM | POA: Diagnosis not present

## 2014-11-02 DIAGNOSIS — I129 Hypertensive chronic kidney disease with stage 1 through stage 4 chronic kidney disease, or unspecified chronic kidney disease: Secondary | ICD-10-CM | POA: Diagnosis not present

## 2014-11-02 DIAGNOSIS — Z79899 Other long term (current) drug therapy: Secondary | ICD-10-CM | POA: Diagnosis not present

## 2014-11-02 DIAGNOSIS — N183 Chronic kidney disease, stage 3 (moderate): Secondary | ICD-10-CM | POA: Diagnosis not present

## 2014-11-02 DIAGNOSIS — Z13820 Encounter for screening for osteoporosis: Secondary | ICD-10-CM | POA: Diagnosis not present

## 2014-11-02 DIAGNOSIS — Z0000 Encounter for general adult medical examination without abnormal findings: Secondary | ICD-10-CM | POA: Diagnosis not present

## 2014-11-02 DIAGNOSIS — M2550 Pain in unspecified joint: Secondary | ICD-10-CM | POA: Diagnosis not present

## 2014-11-02 DIAGNOSIS — Z1389 Encounter for screening for other disorder: Secondary | ICD-10-CM | POA: Diagnosis not present

## 2014-11-02 DIAGNOSIS — N959 Unspecified menopausal and perimenopausal disorder: Secondary | ICD-10-CM | POA: Diagnosis not present

## 2014-11-02 DIAGNOSIS — E119 Type 2 diabetes mellitus without complications: Secondary | ICD-10-CM | POA: Diagnosis not present

## 2014-11-05 DIAGNOSIS — M25562 Pain in left knee: Secondary | ICD-10-CM | POA: Diagnosis not present

## 2014-11-08 ENCOUNTER — Other Ambulatory Visit: Payer: Self-pay | Admitting: *Deleted

## 2014-11-08 DIAGNOSIS — C50211 Malignant neoplasm of upper-inner quadrant of right female breast: Secondary | ICD-10-CM

## 2014-11-08 MED ORDER — ANASTROZOLE 1 MG PO TABS: 1.0000 mg | ORAL_TABLET | Freq: Every day | ORAL | Status: DC

## 2014-11-09 DIAGNOSIS — S83242D Other tear of medial meniscus, current injury, left knee, subsequent encounter: Secondary | ICD-10-CM | POA: Diagnosis not present

## 2014-11-09 DIAGNOSIS — S83282D Other tear of lateral meniscus, current injury, left knee, subsequent encounter: Secondary | ICD-10-CM | POA: Diagnosis not present

## 2014-11-09 DIAGNOSIS — M25562 Pain in left knee: Secondary | ICD-10-CM | POA: Diagnosis not present

## 2014-11-17 DIAGNOSIS — M94262 Chondromalacia, left knee: Secondary | ICD-10-CM | POA: Diagnosis not present

## 2014-11-17 DIAGNOSIS — S83242A Other tear of medial meniscus, current injury, left knee, initial encounter: Secondary | ICD-10-CM | POA: Diagnosis not present

## 2014-11-17 DIAGNOSIS — Y929 Unspecified place or not applicable: Secondary | ICD-10-CM | POA: Diagnosis not present

## 2014-11-17 DIAGNOSIS — X58XXXA Exposure to other specified factors, initial encounter: Secondary | ICD-10-CM | POA: Diagnosis not present

## 2014-11-17 DIAGNOSIS — S83282A Other tear of lateral meniscus, current injury, left knee, initial encounter: Secondary | ICD-10-CM | POA: Diagnosis not present

## 2014-11-25 DIAGNOSIS — S83282D Other tear of lateral meniscus, current injury, left knee, subsequent encounter: Secondary | ICD-10-CM | POA: Diagnosis not present

## 2014-11-25 DIAGNOSIS — S83242D Other tear of medial meniscus, current injury, left knee, subsequent encounter: Secondary | ICD-10-CM | POA: Diagnosis not present

## 2014-11-29 DIAGNOSIS — M84652D Pathological fracture in other disease, left femur, subsequent encounter for fracture with routine healing: Secondary | ICD-10-CM | POA: Diagnosis not present

## 2014-11-29 DIAGNOSIS — R262 Difficulty in walking, not elsewhere classified: Secondary | ICD-10-CM | POA: Diagnosis not present

## 2014-11-29 DIAGNOSIS — M25662 Stiffness of left knee, not elsewhere classified: Secondary | ICD-10-CM | POA: Diagnosis not present

## 2014-11-29 DIAGNOSIS — M25562 Pain in left knee: Secondary | ICD-10-CM | POA: Diagnosis not present

## 2014-12-01 DIAGNOSIS — M84352D Stress fracture, left femur, subsequent encounter for fracture with routine healing: Secondary | ICD-10-CM | POA: Diagnosis not present

## 2014-12-01 DIAGNOSIS — R262 Difficulty in walking, not elsewhere classified: Secondary | ICD-10-CM | POA: Diagnosis not present

## 2014-12-01 DIAGNOSIS — M25662 Stiffness of left knee, not elsewhere classified: Secondary | ICD-10-CM | POA: Diagnosis not present

## 2014-12-01 DIAGNOSIS — M25562 Pain in left knee: Secondary | ICD-10-CM | POA: Diagnosis not present

## 2014-12-09 DIAGNOSIS — R262 Difficulty in walking, not elsewhere classified: Secondary | ICD-10-CM | POA: Diagnosis not present

## 2014-12-09 DIAGNOSIS — M25662 Stiffness of left knee, not elsewhere classified: Secondary | ICD-10-CM | POA: Diagnosis not present

## 2014-12-09 DIAGNOSIS — M84352D Stress fracture, left femur, subsequent encounter for fracture with routine healing: Secondary | ICD-10-CM | POA: Diagnosis not present

## 2014-12-09 DIAGNOSIS — M25562 Pain in left knee: Secondary | ICD-10-CM | POA: Diagnosis not present

## 2014-12-14 DIAGNOSIS — M25562 Pain in left knee: Secondary | ICD-10-CM | POA: Diagnosis not present

## 2014-12-14 DIAGNOSIS — R262 Difficulty in walking, not elsewhere classified: Secondary | ICD-10-CM | POA: Diagnosis not present

## 2014-12-14 DIAGNOSIS — M25662 Stiffness of left knee, not elsewhere classified: Secondary | ICD-10-CM | POA: Diagnosis not present

## 2014-12-14 DIAGNOSIS — M84352D Stress fracture, left femur, subsequent encounter for fracture with routine healing: Secondary | ICD-10-CM | POA: Diagnosis not present

## 2014-12-16 ENCOUNTER — Other Ambulatory Visit: Payer: Self-pay | Admitting: Orthopedic Surgery

## 2014-12-16 DIAGNOSIS — M7989 Other specified soft tissue disorders: Secondary | ICD-10-CM

## 2014-12-16 DIAGNOSIS — M25562 Pain in left knee: Secondary | ICD-10-CM | POA: Diagnosis not present

## 2014-12-16 DIAGNOSIS — M79605 Pain in left leg: Secondary | ICD-10-CM

## 2014-12-17 ENCOUNTER — Ambulatory Visit (HOSPITAL_COMMUNITY): Payer: Medicare Other | Attending: Internal Medicine

## 2014-12-17 DIAGNOSIS — M7989 Other specified soft tissue disorders: Secondary | ICD-10-CM | POA: Insufficient documentation

## 2014-12-17 DIAGNOSIS — M79605 Pain in left leg: Secondary | ICD-10-CM | POA: Diagnosis not present

## 2014-12-21 DIAGNOSIS — R262 Difficulty in walking, not elsewhere classified: Secondary | ICD-10-CM | POA: Diagnosis not present

## 2014-12-21 DIAGNOSIS — M84352D Stress fracture, left femur, subsequent encounter for fracture with routine healing: Secondary | ICD-10-CM | POA: Diagnosis not present

## 2014-12-21 DIAGNOSIS — M25662 Stiffness of left knee, not elsewhere classified: Secondary | ICD-10-CM | POA: Diagnosis not present

## 2014-12-21 DIAGNOSIS — M25562 Pain in left knee: Secondary | ICD-10-CM | POA: Diagnosis not present

## 2014-12-23 DIAGNOSIS — M25562 Pain in left knee: Secondary | ICD-10-CM | POA: Diagnosis not present

## 2014-12-23 DIAGNOSIS — M25662 Stiffness of left knee, not elsewhere classified: Secondary | ICD-10-CM | POA: Diagnosis not present

## 2014-12-23 DIAGNOSIS — R262 Difficulty in walking, not elsewhere classified: Secondary | ICD-10-CM | POA: Diagnosis not present

## 2014-12-23 DIAGNOSIS — M84352D Stress fracture, left femur, subsequent encounter for fracture with routine healing: Secondary | ICD-10-CM | POA: Diagnosis not present

## 2015-01-05 DIAGNOSIS — M25562 Pain in left knee: Secondary | ICD-10-CM | POA: Diagnosis not present

## 2015-01-05 DIAGNOSIS — R262 Difficulty in walking, not elsewhere classified: Secondary | ICD-10-CM | POA: Diagnosis not present

## 2015-01-05 DIAGNOSIS — M84352D Stress fracture, left femur, subsequent encounter for fracture with routine healing: Secondary | ICD-10-CM | POA: Diagnosis not present

## 2015-01-05 DIAGNOSIS — M25662 Stiffness of left knee, not elsewhere classified: Secondary | ICD-10-CM | POA: Diagnosis not present

## 2015-01-07 DIAGNOSIS — R262 Difficulty in walking, not elsewhere classified: Secondary | ICD-10-CM | POA: Diagnosis not present

## 2015-01-07 DIAGNOSIS — M25662 Stiffness of left knee, not elsewhere classified: Secondary | ICD-10-CM | POA: Diagnosis not present

## 2015-01-07 DIAGNOSIS — M84352D Stress fracture, left femur, subsequent encounter for fracture with routine healing: Secondary | ICD-10-CM | POA: Diagnosis not present

## 2015-01-07 DIAGNOSIS — M25562 Pain in left knee: Secondary | ICD-10-CM | POA: Diagnosis not present

## 2015-01-10 DIAGNOSIS — R262 Difficulty in walking, not elsewhere classified: Secondary | ICD-10-CM | POA: Diagnosis not present

## 2015-01-10 DIAGNOSIS — M25562 Pain in left knee: Secondary | ICD-10-CM | POA: Diagnosis not present

## 2015-01-10 DIAGNOSIS — M84352D Stress fracture, left femur, subsequent encounter for fracture with routine healing: Secondary | ICD-10-CM | POA: Diagnosis not present

## 2015-01-10 DIAGNOSIS — M25662 Stiffness of left knee, not elsewhere classified: Secondary | ICD-10-CM | POA: Diagnosis not present

## 2015-01-12 DIAGNOSIS — M25662 Stiffness of left knee, not elsewhere classified: Secondary | ICD-10-CM | POA: Diagnosis not present

## 2015-01-12 DIAGNOSIS — R262 Difficulty in walking, not elsewhere classified: Secondary | ICD-10-CM | POA: Diagnosis not present

## 2015-01-12 DIAGNOSIS — M25562 Pain in left knee: Secondary | ICD-10-CM | POA: Diagnosis not present

## 2015-01-12 DIAGNOSIS — M84352D Stress fracture, left femur, subsequent encounter for fracture with routine healing: Secondary | ICD-10-CM | POA: Diagnosis not present

## 2015-01-13 DIAGNOSIS — M84352D Stress fracture, left femur, subsequent encounter for fracture with routine healing: Secondary | ICD-10-CM | POA: Diagnosis not present

## 2015-01-18 DIAGNOSIS — R262 Difficulty in walking, not elsewhere classified: Secondary | ICD-10-CM | POA: Diagnosis not present

## 2015-01-18 DIAGNOSIS — M25562 Pain in left knee: Secondary | ICD-10-CM | POA: Diagnosis not present

## 2015-01-18 DIAGNOSIS — M25662 Stiffness of left knee, not elsewhere classified: Secondary | ICD-10-CM | POA: Diagnosis not present

## 2015-01-18 DIAGNOSIS — M84352D Stress fracture, left femur, subsequent encounter for fracture with routine healing: Secondary | ICD-10-CM | POA: Diagnosis not present

## 2015-01-20 DIAGNOSIS — M25562 Pain in left knee: Secondary | ICD-10-CM | POA: Diagnosis not present

## 2015-01-20 DIAGNOSIS — R262 Difficulty in walking, not elsewhere classified: Secondary | ICD-10-CM | POA: Diagnosis not present

## 2015-01-20 DIAGNOSIS — M84352D Stress fracture, left femur, subsequent encounter for fracture with routine healing: Secondary | ICD-10-CM | POA: Diagnosis not present

## 2015-01-20 DIAGNOSIS — M25662 Stiffness of left knee, not elsewhere classified: Secondary | ICD-10-CM | POA: Diagnosis not present

## 2015-01-25 DIAGNOSIS — R262 Difficulty in walking, not elsewhere classified: Secondary | ICD-10-CM | POA: Diagnosis not present

## 2015-01-25 DIAGNOSIS — M84352D Stress fracture, left femur, subsequent encounter for fracture with routine healing: Secondary | ICD-10-CM | POA: Diagnosis not present

## 2015-01-25 DIAGNOSIS — M25662 Stiffness of left knee, not elsewhere classified: Secondary | ICD-10-CM | POA: Diagnosis not present

## 2015-01-25 DIAGNOSIS — M25562 Pain in left knee: Secondary | ICD-10-CM | POA: Diagnosis not present

## 2015-01-27 DIAGNOSIS — M25562 Pain in left knee: Secondary | ICD-10-CM | POA: Diagnosis not present

## 2015-01-27 DIAGNOSIS — M84352D Stress fracture, left femur, subsequent encounter for fracture with routine healing: Secondary | ICD-10-CM | POA: Diagnosis not present

## 2015-01-27 DIAGNOSIS — M25662 Stiffness of left knee, not elsewhere classified: Secondary | ICD-10-CM | POA: Diagnosis not present

## 2015-01-27 DIAGNOSIS — R262 Difficulty in walking, not elsewhere classified: Secondary | ICD-10-CM | POA: Diagnosis not present

## 2015-02-10 DIAGNOSIS — M25562 Pain in left knee: Secondary | ICD-10-CM | POA: Diagnosis not present

## 2015-03-08 DIAGNOSIS — Z1211 Encounter for screening for malignant neoplasm of colon: Secondary | ICD-10-CM | POA: Diagnosis not present

## 2015-03-08 DIAGNOSIS — K5730 Diverticulosis of large intestine without perforation or abscess without bleeding: Secondary | ICD-10-CM | POA: Diagnosis not present

## 2015-03-08 DIAGNOSIS — E669 Obesity, unspecified: Secondary | ICD-10-CM | POA: Diagnosis not present

## 2015-03-15 DIAGNOSIS — M1712 Unilateral primary osteoarthritis, left knee: Secondary | ICD-10-CM | POA: Diagnosis not present

## 2015-03-16 ENCOUNTER — Other Ambulatory Visit: Payer: Self-pay

## 2015-03-16 ENCOUNTER — Other Ambulatory Visit: Payer: Self-pay | Admitting: Hematology and Oncology

## 2015-03-16 DIAGNOSIS — Z1211 Encounter for screening for malignant neoplasm of colon: Secondary | ICD-10-CM | POA: Diagnosis not present

## 2015-03-16 DIAGNOSIS — Z9889 Other specified postprocedural states: Secondary | ICD-10-CM

## 2015-03-16 DIAGNOSIS — Z1212 Encounter for screening for malignant neoplasm of rectum: Secondary | ICD-10-CM | POA: Diagnosis not present

## 2015-04-04 ENCOUNTER — Ambulatory Visit
Admission: RE | Admit: 2015-04-04 | Discharge: 2015-04-04 | Disposition: A | Discharge status: Home / Self Care | Payer: Medicare Other | Source: Ambulatory Visit | Attending: Hematology and Oncology | Admitting: Hematology and Oncology

## 2015-04-04 DIAGNOSIS — R928 Other abnormal and inconclusive findings on diagnostic imaging of breast: Secondary | ICD-10-CM | POA: Diagnosis not present

## 2015-04-04 DIAGNOSIS — Z9889 Other specified postprocedural states: Secondary | ICD-10-CM

## 2015-04-12 NOTE — Assessment & Plan Note (Signed)
Right breast DCIS with calcification status post lumpectomy 05/26/2013 ER/PR positive margins negative, did not receive radiation because the tumor size was so small, started anastrozole 1 mg daily 07/02/2013  Anastrozole toxicities:  1. Musculoskeletal aches and pains in both lower extremities  2. Bone density to be done in one month by her primary care physician, previous bone density 2013 was normal   Breast Cancer Surveillance:  1. Breast exam 04/13/2015: Normal  2. Mammogram 04/02/2014 No abnormalities. Postsurgical changes. Breast Density Category B. I recommended that she get 3-D mammograms for surveillance.  Discussed the differences between different breast density categories.

## 2015-04-13 ENCOUNTER — Telehealth: Payer: Self-pay | Admitting: Hematology and Oncology

## 2015-04-13 ENCOUNTER — Encounter: Payer: Self-pay | Admitting: Hematology and Oncology

## 2015-04-13 ENCOUNTER — Ambulatory Visit (HOSPITAL_BASED_OUTPATIENT_CLINIC_OR_DEPARTMENT_OTHER): Payer: Medicare Other | Admitting: Hematology and Oncology

## 2015-04-13 VITALS — BP 123/56 | HR 64 | Temp 97.3°F | Resp 18 | Ht 62.0 in | Wt 184.2 lb

## 2015-04-13 DIAGNOSIS — Z79811 Long term (current) use of aromatase inhibitors: Secondary | ICD-10-CM

## 2015-04-13 DIAGNOSIS — C50211 Malignant neoplasm of upper-inner quadrant of right female breast: Secondary | ICD-10-CM

## 2015-04-13 DIAGNOSIS — D0511 Intraductal carcinoma in situ of right breast: Secondary | ICD-10-CM | POA: Diagnosis not present

## 2015-04-13 NOTE — Progress Notes (Signed)
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 She appears to be tolerating it moderately well. She does have myalgias and stiffness in the morning. She recently undergone knee surgery and is finishing with physical therapy. She is using a cane to get around. Denies any lumps or nodules in the breasts. Recent mammogram was normal.  REVIEW OF SYSTEMS:   Constitutional: Denies fevers, chills or abnormal weight loss Eyes: Denies blurriness of vision Ears, nose, mouth, throat, and face: Denies mucositis or sore throat Respiratory: Denies cough, dyspnea or wheezes Cardiovascular: Denies palpitation, chest discomfort or lower extremity swelling Gastrointestinal:  Denies nausea, heartburn or change in bowel habits Skin: Denies abnormal skin rashes Lymphatics: Denies new lymphadenopathy or easy bruising Neurological:Denies numbness, tingling or new weaknesses, recent knee surgery Behavioral/Psych: Mood is stable, no new changes  Breast:  denies any pain or lumps or nodules  in either breasts All other systems were reviewed with the patient and are negative.  I have reviewed the past medical history, past surgical history, social history and family history with the patient and they are unchanged from previous note.  ALLERGIES:  is allergic to codeine.  MEDICATIONS:  Current Outpatient Prescriptions  Medication Sig Dispense Refill  . allopurinol (ZYLOPRIM) 100 MG tablet Take 1 tablet (100 mg total) by mouth 2 (two) times daily. 60 tablet 2  . ALPRAZolam (XANAX) 0.25 MG tablet Take 0.25 mg by mouth at bedtime as needed for anxiety.    Marland Kitchen anastrozole (ARIMIDEX) 1 MG tablet Take 1 tablet (1 mg total) by mouth daily. 90 tablet 3  . aspirin 81 MG tablet Take 81 mg by mouth daily.    Marland Kitchen atenolol-chlorthalidone (TENORETIC) 50-25 MG per tablet Take 1 tablet by mouth daily.    . Cholecalciferol (VITAMIN D PO) Take 2,000 Int'l Units by mouth daily.    . colchicine 0.6 MG tablet Take 0.6 mg by mouth daily.    Marland Kitchen gabapentin (NEURONTIN) 300 MG capsule Take 1 capsule (300 mg total) by mouth 3 (three) times daily. 60 capsule 0  . Magnesium 250 MG TABS Take 1 tablet by mouth daily.    . meloxicam (MOBIC) 15 MG tablet Take 1 tablet (15 mg total) by mouth daily. 15 tablet 0  . potassium gluconate 595 MG TABS tablet Take 595 mg by mouth daily.     No current facility-administered medications for this visit.    PHYSICAL EXAMINATION: ECOG PERFORMANCE STATUS: 1 - Symptomatic but completely ambulatory  Filed Vitals:   04/13/15 1043  BP: 123/56  Pulse: 64  Temp: 97.3 F (36.3  C)  Resp: 18   Filed Weights   04/13/15 1043  Weight: 184 lb 3.2 oz (83.553 kg)    GENERAL:alert, no distress and comfortable SKIN: skin color, texture, turgor are normal, no rashes or significant lesions EYES: normal, Conjunctiva are pink and non-injected, sclera clear OROPHARYNX:no exudate, no erythema and lips, buccal mucosa, and tongue normal  NECK: supple, thyroid normal size, non-tender,  without nodularity LYMPH:  no palpable lymphadenopathy in the cervical, axillary or inguinal LUNGS: clear to auscultation and percussion with normal breathing effort HEART: regular rate & rhythm and no murmurs and no lower extremity edema ABDOMEN:abdomen soft, non-tender and normal bowel sounds Musculoskeletal:no cyanosis of digits and no clubbing  NEURO: alert & oriented x 3 with fluent speech, no focal motor/sensory deficits BREAST: No palpable masses or nodules in either right or left breasts. No palpable axillary supraclavicular or infraclavicular adenopathy no breast tenderness or nipple discharge. (exam performed in the presence of a chaperone)  LABORATORY DATA: I have reviewed the data as listed   Chemistry      Component Value Date/Time   NA 141 03/03/2014 1000   NA 142 05/19/2013 1050   K 3.9 03/03/2014 1000   K 3.9 05/19/2013 1050   CL 102 05/19/2013 1050   CO2 27 03/03/2014 1000   CO2 31 05/19/2013 1050   BUN 24.6 03/03/2014 1000   BUN 24* 05/19/2013 1050   CREATININE 1.2* 03/03/2014 1000   CREATININE 1.27* 05/19/2013 1050      Component Value Date/Time   CALCIUM 9.6 03/03/2014 1000   CALCIUM 9.8 05/19/2013 1050   ALKPHOS 95 03/03/2014 1000   AST 19 03/03/2014 1000   ALT 15 03/03/2014 1000   BILITOT 0.55 03/03/2014 1000       Lab Results  Component Value Date   WBC 6.2 03/03/2014   HGB 12.8 03/03/2014   HCT 39.1 03/03/2014   MCV 92.9 03/03/2014   PLT 273 03/03/2014   NEUTROABS 3.3 03/03/2014   ASSESSMENT & PLAN:  Breast cancer of upper-inner quadrant of right female breast Right breast DCIS with calcification status post lumpectomy 05/26/2013 ER/PR positive margins negative, did not receive radiation because the tumor size was so small, started anastrozole 1 mg daily 07/02/2013   Anastrozole toxicities:  1. Musculoskeletal aches and pains in both lower extremities , knee surgery 2016 2. Bone density done at her primary care office was normal   Breast  Cancer Surveillance:  1. Breast exam 04/13/2015: Normal  2. Mammogram 04/02/2014 No abnormalities. Postsurgical changes. Breast Density Category B. I recommended that she get 3-D mammograms for surveillance.  Discussed the differences between different breast density categories. 3. Patient stated that she had a bone density test with her primary care physician and was normal. Do not have this report.  Return to clinic in 1 year for follow-up  No orders of the defined types were placed in this encounter.   The patient has a good understanding of the overall plan. she agrees with it. she will call with any problems that may develop before the next visit here.   Rulon Eisenmenger, MD 04/13/2015

## 2015-04-13 NOTE — Telephone Encounter (Signed)
appointments made and avs printed for patient °

## 2015-04-14 DIAGNOSIS — Z79899 Other long term (current) drug therapy: Secondary | ICD-10-CM | POA: Diagnosis not present

## 2015-04-14 DIAGNOSIS — E559 Vitamin D deficiency, unspecified: Secondary | ICD-10-CM | POA: Diagnosis not present

## 2015-04-14 DIAGNOSIS — F419 Anxiety disorder, unspecified: Secondary | ICD-10-CM | POA: Diagnosis not present

## 2015-04-14 DIAGNOSIS — M2550 Pain in unspecified joint: Secondary | ICD-10-CM | POA: Diagnosis not present

## 2015-04-14 DIAGNOSIS — N183 Chronic kidney disease, stage 3 (moderate): Secondary | ICD-10-CM | POA: Diagnosis not present

## 2015-04-14 DIAGNOSIS — I10 Essential (primary) hypertension: Secondary | ICD-10-CM | POA: Diagnosis not present

## 2015-04-14 DIAGNOSIS — F411 Generalized anxiety disorder: Secondary | ICD-10-CM | POA: Diagnosis not present

## 2015-04-26 DIAGNOSIS — R071 Chest pain on breathing: Secondary | ICD-10-CM | POA: Diagnosis not present

## 2015-04-26 DIAGNOSIS — R0602 Shortness of breath: Secondary | ICD-10-CM | POA: Diagnosis not present

## 2015-04-26 DIAGNOSIS — Z79899 Other long term (current) drug therapy: Secondary | ICD-10-CM | POA: Diagnosis not present

## 2015-04-26 DIAGNOSIS — J189 Pneumonia, unspecified organism: Secondary | ICD-10-CM | POA: Diagnosis not present

## 2015-04-26 DIAGNOSIS — M1712 Unilateral primary osteoarthritis, left knee: Secondary | ICD-10-CM | POA: Diagnosis not present

## 2015-04-26 DIAGNOSIS — I10 Essential (primary) hypertension: Secondary | ICD-10-CM | POA: Diagnosis not present

## 2015-05-31 DIAGNOSIS — M1712 Unilateral primary osteoarthritis, left knee: Secondary | ICD-10-CM | POA: Diagnosis not present

## 2015-07-08 ENCOUNTER — Encounter (HOSPITAL_BASED_OUTPATIENT_CLINIC_OR_DEPARTMENT_OTHER): Payer: Self-pay | Admitting: *Deleted

## 2015-07-08 ENCOUNTER — Emergency Department (HOSPITAL_BASED_OUTPATIENT_CLINIC_OR_DEPARTMENT_OTHER)
Admission: EM | Admit: 2015-07-08 | Discharge: 2015-07-08 | Disposition: A | Discharge status: Home / Self Care | Payer: Medicare Other | Attending: Emergency Medicine | Admitting: Emergency Medicine

## 2015-07-08 DIAGNOSIS — Y9389 Activity, other specified: Secondary | ICD-10-CM | POA: Diagnosis not present

## 2015-07-08 DIAGNOSIS — Z853 Personal history of malignant neoplasm of breast: Secondary | ICD-10-CM | POA: Diagnosis not present

## 2015-07-08 DIAGNOSIS — I10 Essential (primary) hypertension: Secondary | ICD-10-CM | POA: Diagnosis not present

## 2015-07-08 DIAGNOSIS — Z8673 Personal history of transient ischemic attack (TIA), and cerebral infarction without residual deficits: Secondary | ICD-10-CM | POA: Diagnosis not present

## 2015-07-08 DIAGNOSIS — M1990 Unspecified osteoarthritis, unspecified site: Secondary | ICD-10-CM | POA: Diagnosis not present

## 2015-07-08 DIAGNOSIS — F419 Anxiety disorder, unspecified: Secondary | ICD-10-CM | POA: Insufficient documentation

## 2015-07-08 DIAGNOSIS — S61219A Laceration without foreign body of unspecified finger without damage to nail, initial encounter: Secondary | ICD-10-CM

## 2015-07-08 DIAGNOSIS — M109 Gout, unspecified: Secondary | ICD-10-CM | POA: Diagnosis not present

## 2015-07-08 DIAGNOSIS — Z791 Long term (current) use of non-steroidal anti-inflammatories (NSAID): Secondary | ICD-10-CM | POA: Diagnosis not present

## 2015-07-08 DIAGNOSIS — S61213A Laceration without foreign body of left middle finger without damage to nail, initial encounter: Secondary | ICD-10-CM | POA: Diagnosis not present

## 2015-07-08 DIAGNOSIS — Z79899 Other long term (current) drug therapy: Secondary | ICD-10-CM | POA: Insufficient documentation

## 2015-07-08 DIAGNOSIS — Y9289 Other specified places as the place of occurrence of the external cause: Secondary | ICD-10-CM | POA: Diagnosis not present

## 2015-07-08 DIAGNOSIS — W268XXA Contact with other sharp object(s), not elsewhere classified, initial encounter: Secondary | ICD-10-CM | POA: Diagnosis not present

## 2015-07-08 DIAGNOSIS — Z7982 Long term (current) use of aspirin: Secondary | ICD-10-CM | POA: Insufficient documentation

## 2015-07-08 DIAGNOSIS — Y998 Other external cause status: Secondary | ICD-10-CM | POA: Insufficient documentation

## 2015-07-08 NOTE — ED Notes (Signed)
Pt amb to room 7 with quick steady gait smiling in nad. Pt reports laceration to her left middle finger on a razor Wednesday. Pt states she is here today because the pain became worse last night. Pt is soaking finger in saline/betadine solution.

## 2015-07-08 NOTE — ED Provider Notes (Signed)
CSN: [id number]     Arrival date & time 07/08/15  [id number] History   First MD Initiated Contact with Patient 07/08/15 1005     Chief Complaint  Patient presents with  . Laceration     (Consider location/radiation/quality/duration/timing/severity/associated sxs/prior Treatment) HPI Patient presents with superficial laceration to the distal tip of the third digit of the left hand. States that she put her hand into her purse and cut it on a razor. Bleeding is controlled. No swelling or redness. Patient does state she gets periodic throbbing in the finger. Past Medical History  Diagnosis Date  . Breast cancer ([place])   . Anxiety   . Arthritis   . Gout   . Stroke [facility])     tia    06  . Hypertension     dr Karlton Lemon   Past Surgical History  Procedure Laterality Date  . Abdominal hysterectomy    . Cholecystectomy    . Breast biopsy Left 05/26/2013    Procedure: BREAST BIOPSY WITH NEEDLE LOCALIZATION;  Surgeon: Stark Klein, MD;  Location: [place];  Service: General;  Laterality: Left;  . Partial mastectomy with needle localization Right 05/26/2013    Procedure: PARTIAL MASTECTOMY WITH NEEDLE LOCALIZATION;  Surgeon: Stark Klein, MD;  Location: [place];  Service: General;  Laterality: Right;   History reviewed. No pertinent family history. Social History  Substance Use Topics  . Smoking status: Never Smoker   . Smokeless tobacco: Never Used  . Alcohol Use: Yes     Comment: weekly   OB History    No data available     Review of Systems  Constitutional: Negative for fever.  Gastrointestinal: Negative for nausea and vomiting.  Skin: Positive for wound. Negative for color change and rash.  Neurological: Negative for weakness and numbness.  All other systems reviewed and are negative.     Allergies  Codeine  Home Medications   Prior to Admission medications   Medication Sig Start Date End Date Taking? Authorizing Provider  ALPRAZolam Duanne Moron) 0.25 MG tablet Take 0.25 mg by mouth at  bedtime as needed for anxiety.    Historical Provider, MD  anastrozole (ARIMIDEX) 1 MG tablet Take 1 tablet (1 mg total) by mouth daily. 11/08/14   Nicholas Lose, MD  aspirin 81 MG tablet Take 81 mg by mouth daily.    Historical Provider, MD  atenolol-chlorthalidone (TENORETIC) 50-25 MG per tablet Take 1 tablet by mouth daily.    Historical Provider, MD  Cholecalciferol (VITAMIN D PO) Take 2,000 Int'l Units by mouth daily.    Historical Provider, MD  colchicine 0.6 MG tablet Take 0.6 mg by mouth daily.    Historical Provider, MD  gabapentin (NEURONTIN) 300 MG capsule Take 1 capsule (300 mg total) by mouth 3 (three) times daily. 06/22/14   Jola Schmidt, MD  Magnesium 250 MG TABS Take 1 tablet by mouth daily.    Historical Provider, MD  meloxicam (MOBIC) 15 MG tablet Take 1 tablet (15 mg total) by mouth daily. 06/22/14   Jola Schmidt, MD  potassium gluconate 595 MG TABS tablet Take 595 mg by mouth daily.    Historical Provider, MD   BP 144/88 mmHg  Pulse 65  Temp(Src) 97.9 F (36.6 C) (Oral)  Resp 20  Ht 5\' 1"  (1.549 m)  Wt 187 lb (84.823 kg)  BMI 35.35 kg/m2  SpO2 98% Physical Exam  Constitutional: She is oriented to person, place, and time. She appears well-developed and well-nourished. No distress.  HENT:  Head: Normocephalic and atraumatic.  Mouth/Throat: Oropharynx is clear and moist.  Eyes: EOM are normal. Pupils are equal, round, and reactive to light.  Neck: Normal range of motion. Neck supple.  Cardiovascular: Normal rate and regular rhythm.   Pulmonary/Chest: Effort normal and breath sounds normal.  Abdominal: Soft. Bowel sounds are normal.  Musculoskeletal: Normal range of motion. She exhibits no edema or tenderness.  Neurological: She is alert and oriented to person, place, and time.  5/5 strength of flexion and extension of the third digit of the left hand. Sensation is fully intact even distally to the wound.  Skin: Skin is warm and dry. No rash noted. No erythema.  Patient  has small 0.5 cm there laceration to the distal tip of the third finger. It is distal to the DIP. Irregular border. Superficial in nature with no active bleeding. There is no surrounding erythema or tracking up the palmar surface of the hand.  Psychiatric: She has a normal mood and affect. Her behavior is normal.  Nursing note and vitals reviewed.   ED Course  Procedures (including critical care time) Labs Review Labs Reviewed - No data to display  Imaging Review No results found. I have personally reviewed and evaluated these images and lab results as part of my medical decision-making.   EKG Interpretation None      MDM   Final diagnoses:  Finger laceration, initial encounter    Superficial laceration to the third digit of the left hand. No evidence of infection. We'll gait and cleaned thoroughly and the emergency department. Betadine and clean dressing placed. Patient instructed on dressing change and signs of infection. She is to follow-up with her primary doctor. Return precautions given.    Julianne Rice, MD 07/08/15 [phone]

## 2015-07-08 NOTE — Discharge Instructions (Signed)
Nonsutured Laceration Care °A laceration is a cut that goes through all layers of the skin and extends into the tissue that is right under the skin. This type of cut is usually stitched up (sutured) or closed with tape (adhesive strips) or skin glue shortly after the injury happens. °However, if the wound is dirty or if several hours pass before medical treatment is provided, it is likely that germs (bacteria) will enter the wound. Closing a laceration after bacteria have entered it increases the risk of infection. In these cases, your health care provider may leave the laceration open (nonsutured) and cover it with a bandage. This type of treatment helps prevent infection and allows the wound to heal from the deepest layer of tissue damage up to the surface. °An open fracture is a type of injury that may involve nonsutured lacerations. An open fracture is a break in a bone that happens along with one or more lacerations through the skin that is near the fracture site. °HOW TO CARE FOR YOUR NONSUTURED LACERATION °· Take or apply over-the-counter and prescription medicines only as told by your health care provider. °· If you were prescribed an antibiotic medicine, take or apply it as told by your health care provider. Do not stop using the antibiotic even if your condition improves. °· Clean the wound one time each day or as told by your health care provider. °¨ Wash the wound with mild soap and water. °¨ Rinse the wound with water to remove all soap. °¨ Pat your wound dry with a clean towel. Do not rub the wound. °· Do not inject anything into the wound unless your health care provider told you to. °· Change any bandages (dressings) as told by your health care provider. This includes changing the dressing if it gets wet, dirty, or starts to smell bad. °· Keep the dressing dry until your health care provider says it can be removed. Do not take baths, swim, or do anything that puts your wound underwater until your  health care provider approves. °· Raise (elevate) the injured area above the level of your heart while you are sitting or lying down, if possible. °· Do not scratch or pick at the wound. °· Check your wound every day for signs of infection. Watch for: °¨ Redness, swelling, or pain. °¨ Fluid, blood, or pus. °· Keep all follow-up visits as told by your health care provider. This is important. °SEEK MEDICAL CARE IF: °· You received a tetanus and shot and you have swelling, severe pain, redness, or bleeding at the injection site.   °· You have a fever. °· Your pain is not controlled with medicine. °· You have increased redness, swelling, or pain at the site of your wound. °· You have fluid, blood, or pus coming from your wound. °· You notice a bad smell coming from your wound or your dressing. °· You notice something coming out of the wound, such as wood or glass. °· You notice a change in the color of your skin near your wound. °· You develop a new rash. °· You need to change the dressing frequently due to fluid, blood, or pus draining from the wound. °· You develop numbness around your wound. °SEEK IMMEDIATE MEDICAL CARE IF: °· Your pain suddenly increases and is severe. °· You develop severe swelling around the wound. °· The wound is on your hand or foot and you cannot properly move a finger or toe. °· The wound is on your hand or   foot and you notice that your fingers or toes look pale or bluish. °· You have a red streak going away from your wound. °  °This information is not intended to replace advice given to you by your health care provider. Make sure you discuss any questions you have with your health care provider. °  °Document Released: 05/02/2006 Document Revised: 10/19/2014 Document Reviewed: 05/31/2014 °Elsevier Interactive Patient Education ©2016 [employer] ° °

## 2015-07-19 DIAGNOSIS — M1712 Unilateral primary osteoarthritis, left knee: Secondary | ICD-10-CM | POA: Diagnosis not present

## 2015-08-30 DIAGNOSIS — M1712 Unilateral primary osteoarthritis, left knee: Secondary | ICD-10-CM | POA: Diagnosis not present

## 2015-09-12 DIAGNOSIS — Z853 Personal history of malignant neoplasm of breast: Secondary | ICD-10-CM | POA: Diagnosis not present

## 2015-09-12 DIAGNOSIS — I10 Essential (primary) hypertension: Secondary | ICD-10-CM | POA: Diagnosis not present

## 2015-09-12 DIAGNOSIS — F430 Acute stress reaction: Secondary | ICD-10-CM | POA: Diagnosis not present

## 2015-09-12 DIAGNOSIS — E559 Vitamin D deficiency, unspecified: Secondary | ICD-10-CM | POA: Diagnosis not present

## 2015-09-12 DIAGNOSIS — M1A0790 Idiopathic chronic gout, unspecified ankle and foot, without tophus (tophi): Secondary | ICD-10-CM | POA: Diagnosis not present

## 2015-09-12 DIAGNOSIS — Z79899 Other long term (current) drug therapy: Secondary | ICD-10-CM | POA: Diagnosis not present

## 2015-09-12 DIAGNOSIS — Z0000 Encounter for general adult medical examination without abnormal findings: Secondary | ICD-10-CM | POA: Diagnosis not present

## 2015-11-25 ENCOUNTER — Other Ambulatory Visit: Payer: Self-pay | Admitting: Hematology and Oncology

## 2016-01-09 DIAGNOSIS — F430 Acute stress reaction: Secondary | ICD-10-CM | POA: Diagnosis not present

## 2016-01-09 DIAGNOSIS — E559 Vitamin D deficiency, unspecified: Secondary | ICD-10-CM | POA: Diagnosis not present

## 2016-01-09 DIAGNOSIS — I10 Essential (primary) hypertension: Secondary | ICD-10-CM | POA: Diagnosis not present

## 2016-03-12 ENCOUNTER — Other Ambulatory Visit: Payer: Self-pay | Admitting: Hematology and Oncology

## 2016-03-13 ENCOUNTER — Telehealth: Payer: Self-pay

## 2016-03-14 ENCOUNTER — Other Ambulatory Visit: Payer: Self-pay | Admitting: Family

## 2016-03-14 DIAGNOSIS — Z853 Personal history of malignant neoplasm of breast: Secondary | ICD-10-CM

## 2016-04-04 ENCOUNTER — Other Ambulatory Visit: Payer: Self-pay

## 2016-04-04 ENCOUNTER — Other Ambulatory Visit: Payer: Self-pay | Admitting: Family

## 2016-04-04 DIAGNOSIS — Z853 Personal history of malignant neoplasm of breast: Secondary | ICD-10-CM

## 2016-04-05 ENCOUNTER — Ambulatory Visit
Admission: RE | Admit: 2016-04-05 | Discharge: 2016-04-05 | Disposition: A | Discharge status: Home / Self Care | Payer: Medicare Other | Source: Ambulatory Visit | Attending: Family | Admitting: Family

## 2016-04-05 ENCOUNTER — Other Ambulatory Visit: Payer: Self-pay | Admitting: Family

## 2016-04-05 DIAGNOSIS — R921 Mammographic calcification found on diagnostic imaging of breast: Secondary | ICD-10-CM | POA: Diagnosis not present

## 2016-04-05 DIAGNOSIS — Z853 Personal history of malignant neoplasm of breast: Secondary | ICD-10-CM

## 2016-04-13 ENCOUNTER — Ambulatory Visit: Payer: Medicare Other | Admitting: Hematology and Oncology

## 2016-04-17 NOTE — Assessment & Plan Note (Signed)
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 Musculoskeletal aches and pains in both lower extremities , knee surgery 2016 2. Bone density done at her primary care office was normal   Breast Cancer Surveillance:  1. Breast exam 04/13/2015: Normal  2. Mammogram 04/02/2016 Probably benign round and punctate calcifications adjacent to the right breast lumpectomy site. Recommend follow-up right breast diagnostic mammogram in 6 months to ensure stability  3. Patient stated that she had a bone density test with her primary care physician and was normal. Do not have this report.  Return to clinic in 1 year for follow-up

## 2016-04-18 ENCOUNTER — Ambulatory Visit (HOSPITAL_BASED_OUTPATIENT_CLINIC_OR_DEPARTMENT_OTHER): Payer: Medicare Other | Admitting: Hematology and Oncology

## 2016-04-18 ENCOUNTER — Other Ambulatory Visit: Payer: Self-pay

## 2016-04-18 ENCOUNTER — Encounter: Payer: Self-pay | Admitting: Hematology and Oncology

## 2016-04-18 DIAGNOSIS — Z170 Estrogen receptor positive status [ER+]: Secondary | ICD-10-CM

## 2016-04-18 DIAGNOSIS — C50211 Malignant neoplasm of upper-inner quadrant of right female breast: Secondary | ICD-10-CM

## 2016-04-18 DIAGNOSIS — D0511 Intraductal carcinoma in situ of right breast: Secondary | ICD-10-CM

## 2016-04-18 MED ORDER — GABAPENTIN 300 MG PO CAPS: 300.0000 mg | ORAL_CAPSULE | Freq: Three times a day (TID) | ORAL | 3 refills | Status: DC

## 2016-04-18 NOTE — Progress Notes (Signed)
Patient Care Team: Willey Blade, MD as PCP - General (Internal Medicine)  DIAGNOSIS:  Encounter Diagnosis  Name Primary?  . 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 She is tolerating Arimidex extremely well. She does have occasional hot flashes and muscle aches and pains. She had a meniscus tear and had a meniscus surgery. The doctors advised her that she may need a total knee replacement. She is debating about it. She denies any lumps or nodules in the breasts. Recent mammograms show dystrophic calcifications and a six-month mammogram is recommended.  REVIEW OF SYSTEMS:   Constitutional: Denies fevers, chills or abnormal weight loss Eyes: Denies blurriness of vision Ears, nose, mouth, throat, and face: Denies mucositis or sore throat Respiratory: Denies cough, dyspnea or wheezes Cardiovascular: Denies palpitation, chest discomfort Gastrointestinal:  Denies nausea, heartburn or change in bowel habits Skin: Denies abnormal skin rashes Lymphatics: Denies new lymphadenopathy or easy bruising Neurological:Denies numbness, tingling or new weaknesses Behavioral/Psych: Mood is stable, no new changes  Extremities: No lower extremity  edema Breast:  denies any pain or lumps or nodules in either breasts All other systems were reviewed with the patient and are negative.  I have reviewed the past medical history, past surgical history, social history and family history with the patient and they are unchanged from previous note.  ALLERGIES:  is allergic to codeine.  MEDICATIONS:  Current Outpatient Prescriptions  Medication Sig Dispense Refill  . ALPRAZolam (XANAX) 0.25 MG tablet Take 0.25 mg by mouth at bedtime as needed for anxiety.    Marland Kitchen anastrozole (ARIMIDEX) 1 MG tablet TAKE ONE TABLET BY MOUTH ONCE DAILY 90 tablet 0  . aspirin 81 MG tablet Take 81 mg by mouth daily.    Marland Kitchen atenolol-chlorthalidone (TENORETIC) 50-25 MG per tablet Take 1 tablet by mouth daily.    . Cholecalciferol (VITAMIN D PO) Take 2,000 Int'l Units by mouth daily.    . colchicine 0.6 MG tablet Take 0.6 mg by mouth daily.    Marland Kitchen gabapentin (NEURONTIN) 300 MG capsule Take 1 capsule (300 mg total) by mouth 3 (three) times daily. 60 capsule 0  . Magnesium 250 MG TABS Take 1 tablet by mouth daily.    . meloxicam (MOBIC) 15 MG tablet Take 1 tablet (15 mg total) by mouth daily. 15 tablet 0  . potassium gluconate 595 MG TABS tablet Take 595 mg by mouth daily.     No current facility-administered medications for this visit.     PHYSICAL EXAMINATION: ECOG PERFORMANCE STATUS: 1 - Symptomatic but completely ambulatory  Vitals:   04/18/16 1139  BP: 112/69  Pulse: (!) 59  Resp: 18  Temp: 98.1 F (36.7 C)   Filed Weights   04/18/16 1139  Weight:  177 lb 9.6 oz (80.6 kg)    GENERAL:alert, no distress and comfortable SKIN: skin color, texture, turgor are normal, no rashes or significant lesions EYES: normal, Conjunctiva are pink and non-injected, sclera clear OROPHARYNX:no exudate, no erythema and lips, buccal mucosa, and tongue normal  NECK: supple, thyroid normal size, non-tender, without nodularity LYMPH:  no palpable lymphadenopathy in the cervical,  axillary or inguinal LUNGS: clear to auscultation and percussion with normal breathing effort HEART: regular rate & rhythm and no murmurs and no lower extremity edema ABDOMEN:abdomen soft, non-tender and normal bowel sounds MUSCULOSKELETAL:no cyanosis of digits and no clubbing  NEURO: alert & oriented x 3 with fluent speech, no focal motor/sensory deficits EXTREMITIES: No lower extremity edema BREAST: No palpable masses or nodules in either right or left breasts. No palpable axillary supraclavicular or infraclavicular adenopathy no breast tenderness or nipple discharge. (exam performed in the presence of a chaperone)  LABORATORY DATA:  I have reviewed the data as listed   Chemistry      Component Value Date/Time   NA 141 03/03/2014 1000   K 3.9 03/03/2014 1000   CL 102 05/19/2013 1050   CO2 27 03/03/2014 1000   BUN 24.6 03/03/2014 1000   CREATININE 1.2 (H) 03/03/2014 1000      Component Value Date/Time   CALCIUM 9.6 03/03/2014 1000   ALKPHOS 95 03/03/2014 1000   AST 19 03/03/2014 1000   ALT 15 03/03/2014 1000   BILITOT 0.55 03/03/2014 1000       Lab Results  Component Value Date   WBC 6.2 03/03/2014   HGB 12.8 03/03/2014   HCT 39.1 03/03/2014   MCV 92.9 03/03/2014   PLT 273 03/03/2014   NEUTROABS 3.3 03/03/2014     ASSESSMENT & PLAN:  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 Musculoskeletal aches and pains in both lower extremities , knee surgery 2016 2. Bone density done at her primary care office was normal   Breast Cancer Surveillance:  1. Breast exam 04/13/2015: Normal  2. Mammogram 04/02/2016 Probably benign round and punctate calcifications adjacent to the right breast lumpectomy site. Recommend  follow-up right breast diagnostic mammogram in 6 months to ensure stability  3. Patient stated that she had a bone density test with her primary care physician and was normal. Do not have this report.  Return to clinic in 1 year for follow-up   No orders of the defined types were placed in this encounter.  The patient has a good understanding of the overall plan. she agrees with it. she will call with any problems that may develop before the next visit here.   Rulon Eisenmenger, MD 04/18/16

## 2016-04-23 DIAGNOSIS — H2513 Age-related nuclear cataract, bilateral: Secondary | ICD-10-CM | POA: Diagnosis not present

## 2016-04-23 DIAGNOSIS — H04123 Dry eye syndrome of bilateral lacrimal glands: Secondary | ICD-10-CM | POA: Diagnosis not present

## 2016-07-19 DIAGNOSIS — E559 Vitamin D deficiency, unspecified: Secondary | ICD-10-CM | POA: Diagnosis not present

## 2016-07-19 DIAGNOSIS — Z0000 Encounter for general adult medical examination without abnormal findings: Secondary | ICD-10-CM | POA: Diagnosis not present

## 2016-07-19 DIAGNOSIS — Z79899 Other long term (current) drug therapy: Secondary | ICD-10-CM | POA: Diagnosis not present

## 2016-07-20 ENCOUNTER — Other Ambulatory Visit: Payer: Self-pay | Admitting: Family

## 2016-07-20 DIAGNOSIS — E2839 Other primary ovarian failure: Secondary | ICD-10-CM

## 2016-07-24 ENCOUNTER — Ambulatory Visit
Admission: RE | Admit: 2016-07-24 | Discharge: 2016-07-24 | Disposition: A | Discharge status: Home / Self Care | Payer: Medicare Other | Source: Ambulatory Visit | Attending: Family | Admitting: Family

## 2016-07-24 DIAGNOSIS — Z13820 Encounter for screening for osteoporosis: Secondary | ICD-10-CM | POA: Diagnosis not present

## 2016-07-24 DIAGNOSIS — Z780 Asymptomatic menopausal state: Secondary | ICD-10-CM | POA: Diagnosis not present

## 2016-07-24 DIAGNOSIS — Z79811 Long term (current) use of aromatase inhibitors: Secondary | ICD-10-CM | POA: Diagnosis not present

## 2016-07-24 DIAGNOSIS — E2839 Other primary ovarian failure: Secondary | ICD-10-CM

## 2016-08-07 DIAGNOSIS — H04123 Dry eye syndrome of bilateral lacrimal glands: Secondary | ICD-10-CM | POA: Diagnosis not present

## 2016-08-07 DIAGNOSIS — H2513 Age-related nuclear cataract, bilateral: Secondary | ICD-10-CM | POA: Diagnosis not present

## 2016-08-23 DIAGNOSIS — H2511 Age-related nuclear cataract, right eye: Secondary | ICD-10-CM | POA: Diagnosis not present

## 2017-02-12 ENCOUNTER — Other Ambulatory Visit: Payer: Self-pay | Admitting: Family

## 2017-02-12 DIAGNOSIS — Z85841 Personal history of malignant neoplasm of brain: Secondary | ICD-10-CM

## 2017-02-14 DIAGNOSIS — Z853 Personal history of malignant neoplasm of breast: Secondary | ICD-10-CM | POA: Diagnosis not present

## 2017-02-14 DIAGNOSIS — M1A0790 Idiopathic chronic gout, unspecified ankle and foot, without tophus (tophi): Secondary | ICD-10-CM | POA: Diagnosis not present

## 2017-02-14 DIAGNOSIS — E559 Vitamin D deficiency, unspecified: Secondary | ICD-10-CM | POA: Diagnosis not present

## 2017-02-14 DIAGNOSIS — Z79899 Other long term (current) drug therapy: Secondary | ICD-10-CM | POA: Diagnosis not present

## 2017-02-14 DIAGNOSIS — M79671 Pain in right foot: Secondary | ICD-10-CM | POA: Diagnosis not present

## 2017-02-14 DIAGNOSIS — I10 Essential (primary) hypertension: Secondary | ICD-10-CM | POA: Diagnosis not present

## 2017-02-26 ENCOUNTER — Ambulatory Visit (INDEPENDENT_AMBULATORY_CARE_PROVIDER_SITE_OTHER): Payer: Medicare Other | Admitting: Podiatry

## 2017-02-26 ENCOUNTER — Encounter: Payer: Self-pay | Admitting: Podiatry

## 2017-02-26 VITALS — BP 111/60 | HR 66 | Ht 62.0 in | Wt 180.0 lb

## 2017-02-26 DIAGNOSIS — M21619 Bunion of unspecified foot: Secondary | ICD-10-CM | POA: Diagnosis not present

## 2017-02-26 DIAGNOSIS — M79671 Pain in right foot: Secondary | ICD-10-CM

## 2017-02-26 DIAGNOSIS — M1000 Idiopathic gout, unspecified site: Secondary | ICD-10-CM

## 2017-02-26 MED ORDER — ALLOPURINOL 100 MG PO TABS: 100.0000 mg | ORAL_TABLET | Freq: Every day | ORAL | 6 refills | Status: DC

## 2017-02-26 NOTE — Patient Instructions (Signed)
Seen for pain in right foot. Noted of bunion deformity and possible gout on right foot. Allopurinol prescribed. Will discuss bunion surgery when time is right. Return as needed.

## 2017-02-26 NOTE — Progress Notes (Signed)
SUBJECTIVE: 81 y.o. yeLast week foot pain was so severe she hardly could walk. Pain was on bottom, bunion, and top of right foot. This pain comes every 2-3 months. She took Colchicine one a day and Tylenol with Ice pack on foot.ar old female presents stating that she was having foot pain off and on for some times.  Taking Gabapentin for aching joints. REVIEW OF SYSTEMS: Pertinent items noted in HPI and remainder of comprehensive ROS otherwise negative.  OBJECTIVE: DERMATOLOGIC EXAMINATION: Normal findings.  VASCULAR EXAMINATION OF LOWER LIMBS: Left side palpable normal and right side is faintly palpable. Posterior Tibial artery: Not palpable on both.  Capillary Filling times within 3 seconds in all digits.  No visible edema or erythema noted. Temperature gradient from tibial crest to dorsum of foot is within normal bilateral.  NEUROLOGIC EXAMINATION OF THE LOWER LIMBS: Subjective numbness and tingling at times. Monofilament (Semmes-Weinstein 10-gm) sensory testing reveal decreased sensitivity. Vibratory sensations(128Hz  turning fork) intact at medial and lateral forefoot bilateral.  Sharp and Dull discriminatory sensations at the plantar ball of hallux is intact bilateral.   MUSCULOSKELETAL EXAMINATION: Positive for severe hallux valgus with bunion on right. Flat foot right, flexible.  ASSESSMENT: Gouty arthritis. Bunion painful, HAV right.  PLAN: Reviewed findings and available treatment options. Rx. Allopurinol 100 mg qd.

## 2017-03-05 DIAGNOSIS — Z23 Encounter for immunization: Secondary | ICD-10-CM | POA: Diagnosis not present

## 2017-04-17 NOTE — Assessment & Plan Note (Deleted)
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 Musculoskeletal aches and pains in both lower extremities , knee surgery 2016 2. Bone density done at her primary care office was normal  Breast Cancer Surveillance:  1. Breast exam 04/18/2017: Normal  2. Mammogram 04/02/2016 Probably benign round and punctate calcifications adjacent to the right breast lumpectomy site. Recommend follow-up right breast diagnostic mammogram in 6 months to ensure stability  3. Patient stated that she had a bone density test with her primary care physician and was normal. Do not have this report.  Return to clinic in 1 year for follow-up

## 2017-04-18 ENCOUNTER — Ambulatory Visit: Payer: Medicare Other | Admitting: Hematology and Oncology

## 2017-04-22 ENCOUNTER — Other Ambulatory Visit: Payer: Self-pay | Admitting: Hematology and Oncology

## 2017-04-25 ENCOUNTER — Ambulatory Visit
Admission: RE | Admit: 2017-04-25 | Discharge: 2017-04-25 | Disposition: A | Discharge status: Home / Self Care | Payer: Medicare Other | Source: Ambulatory Visit | Attending: Family | Admitting: Family

## 2017-04-25 ENCOUNTER — Other Ambulatory Visit: Payer: Self-pay | Admitting: Family

## 2017-04-25 DIAGNOSIS — R0600 Dyspnea, unspecified: Secondary | ICD-10-CM

## 2017-04-25 DIAGNOSIS — J302 Other seasonal allergic rhinitis: Secondary | ICD-10-CM | POA: Diagnosis not present

## 2017-04-25 DIAGNOSIS — E039 Hypothyroidism, unspecified: Secondary | ICD-10-CM | POA: Diagnosis not present

## 2017-04-25 DIAGNOSIS — R0609 Other forms of dyspnea: Secondary | ICD-10-CM

## 2017-04-25 DIAGNOSIS — R0602 Shortness of breath: Secondary | ICD-10-CM | POA: Diagnosis not present

## 2017-04-25 DIAGNOSIS — F430 Acute stress reaction: Secondary | ICD-10-CM | POA: Diagnosis not present

## 2017-04-25 DIAGNOSIS — Z79899 Other long term (current) drug therapy: Secondary | ICD-10-CM | POA: Diagnosis not present

## 2017-04-25 DIAGNOSIS — I10 Essential (primary) hypertension: Secondary | ICD-10-CM | POA: Diagnosis not present

## 2017-04-25 DIAGNOSIS — D519 Vitamin B12 deficiency anemia, unspecified: Secondary | ICD-10-CM | POA: Diagnosis not present

## 2017-04-25 DIAGNOSIS — R05 Cough: Secondary | ICD-10-CM | POA: Diagnosis not present

## 2017-05-07 ENCOUNTER — Ambulatory Visit
Admission: RE | Admit: 2017-05-07 | Discharge: 2017-05-07 | Disposition: A | Discharge status: Home / Self Care | Payer: Medicare Other | Source: Ambulatory Visit | Attending: Family | Admitting: Family

## 2017-05-07 DIAGNOSIS — R928 Other abnormal and inconclusive findings on diagnostic imaging of breast: Secondary | ICD-10-CM | POA: Diagnosis not present

## 2017-05-07 DIAGNOSIS — Z85841 Personal history of malignant neoplasm of brain: Secondary | ICD-10-CM

## 2017-05-08 DIAGNOSIS — Z79899 Other long term (current) drug therapy: Secondary | ICD-10-CM | POA: Diagnosis not present

## 2017-05-08 DIAGNOSIS — N183 Chronic kidney disease, stage 3 (moderate): Secondary | ICD-10-CM | POA: Diagnosis not present

## 2017-07-29 ENCOUNTER — Telehealth: Payer: Self-pay

## 2017-07-29 NOTE — Telephone Encounter (Signed)
Returned pt call regarding yearly appointment. Appointment scheduled. Pt aware of date/time.  Cyndia Bent RN

## 2017-08-08 ENCOUNTER — Telehealth: Payer: Self-pay | Admitting: Adult Health

## 2017-08-08 ENCOUNTER — Inpatient Hospital Stay: Payer: Medicare Other | Attending: Hematology and Oncology | Admitting: Hematology and Oncology

## 2017-08-08 DIAGNOSIS — D0511 Intraductal carcinoma in situ of right breast: Secondary | ICD-10-CM | POA: Diagnosis present

## 2017-08-08 DIAGNOSIS — C50211 Malignant neoplasm of upper-inner quadrant of right female breast: Secondary | ICD-10-CM

## 2017-08-08 DIAGNOSIS — Z170 Estrogen receptor positive status [ER+]: Secondary | ICD-10-CM | POA: Diagnosis not present

## 2017-08-08 MED ORDER — CHLORTHALIDONE 25 MG PO TABS: 25.0000 mg | ORAL_TABLET | Freq: Every day | ORAL | Status: DC

## 2017-08-08 MED ORDER — LOSARTAN POTASSIUM 25 MG PO TABS: 25.0000 mg | ORAL_TABLET | Freq: Every day | ORAL | Status: DC

## 2017-08-08 MED ORDER — ANASTROZOLE 1 MG PO TABS: 1.0000 mg | ORAL_TABLET | Freq: Every day | ORAL | 3 refills | Status: DC

## 2017-08-08 NOTE — Progress Notes (Signed)
Patient Care Team: Willey Blade, MD as PCP - General (Internal Medicine)  DIAGNOSIS:  Encounter Diagnosis  Name Primary?  . Malignant neoplasm of upper-inner quadrant of right breast in female, estrogen receptor positive ([place])     SUMMARY OF ONCOLOGIC HISTORY:   Breast cancer of upper-inner quadrant of right female breast ([place])   04/16/2013 Initial Diagnosis    Breast cancer of upper-inner quadrant of right female breast: Low grade DCIS ER/PR positive      05/26/2013 Surgery    Right Breast lumpectomy which revealed intra-ductal papilloma with calcification; DCIS, ER/PR Positive, margins neg      07/02/2013 -  Anti-estrogen oral therapy    Arimidex 1 mg daily       CHIEF COMPLIANT: Follow-up on anastrozole therapy  INTERVAL HISTORY: Casey Taylor is a 82 year old with above-mentioned history of right breast cancer treated with lumpectomy and is currently on anastrozole therapy.  She appears to be tolerating anastrozole extremely well.  She does however have stiffness in her shoulders which does get better with activity.  She goes to the gym periodically and exercises.  She denies any pain lumps or nodules in the breast.  REVIEW OF SYSTEMS:   Constitutional: Denies fevers, chills or abnormal weight loss Eyes: Denies blurriness of vision Ears, nose, mouth, throat, and face: Denies mucositis or sore throat Respiratory: Denies cough, dyspnea or wheezes Cardiovascular: Denies palpitation, chest discomfort Gastrointestinal:  Denies nausea, heartburn or change in bowel habits Skin: Denies abnormal skin rashes Lymphatics: Denies new lymphadenopathy or easy bruising Neurological:Denies numbness, tingling or new weaknesses Behavioral/Psych: Mood is stable, no new changes  Extremities: No lower extremity edema Breast:  denies any pain or lumps or nodules in either breasts All other systems were reviewed with the patient and are negative.  I have reviewed the past  medical history, past surgical history, social history and family history with the patient and they are unchanged from previous note.  ALLERGIES:  is allergic to codeine.  MEDICATIONS:  Current Outpatient Medications  Medication Sig Dispense Refill  . allopurinol (ZYLOPRIM) 100 MG tablet Take 1 tablet (100 mg total) by mouth daily. 30 tablet 6  . ALPRAZolam (XANAX) 0.25 MG tablet Take 0.25 mg by mouth at bedtime as needed for anxiety.    Marland Kitchen anastrozole (ARIMIDEX) 1 MG tablet TAKE ONE TABLET BY MOUTH ONCE DAILY 90 tablet 0  . aspirin 81 MG tablet Take 81 mg by mouth daily.    Marland Kitchen atenolol-chlorthalidone (TENORETIC) 50-25 MG per tablet Take 1 tablet by mouth daily.    . Cholecalciferol (VITAMIN D PO) Take 2,000 Int'l Units by mouth daily.    . colchicine 0.6 MG tablet Take 0.6 mg by mouth daily.    Marland Kitchen gabapentin (NEURONTIN) 300 MG capsule TAKE ONE CAPSULE BY MOUTH THREE TIMES DAILY 90 capsule 0  . Magnesium 250 MG TABS Take 1 tablet by mouth daily.    . meloxicam (MOBIC) 15 MG tablet Take 1 tablet (15 mg total) by mouth daily. 15 tablet 0  . potassium gluconate 595 MG TABS tablet Take 595 mg by mouth daily.     No current facility-administered medications for this visit.     PHYSICAL EXAMINATION: ECOG PERFORMANCE STATUS: 1 - Symptomatic but completely ambulatory  Vitals:   08/08/17 0925  BP: (!) 125/57  Pulse: 67  Resp: 18  Temp: 98.5 F (36.9 C)  SpO2: 99%   Filed Weights   08/08/17 0925  Weight: 182 lb 12.8 oz (82.9 kg)  GENERAL:alert, no distress and comfortable SKIN: skin color, texture, turgor are normal, no rashes or significant lesions EYES: normal, Conjunctiva are pink and non-injected, sclera clear OROPHARYNX:no exudate, no erythema and lips, buccal mucosa, and tongue normal  NECK: supple, thyroid normal size, non-tender, without nodularity LYMPH:  no palpable lymphadenopathy in the cervical, axillary or inguinal LUNGS: clear to auscultation and percussion with  normal breathing effort HEART: regular rate & rhythm and no murmurs and no lower extremity edema ABDOMEN:abdomen soft, non-tender and normal bowel sounds MUSCULOSKELETAL:no cyanosis of digits and no clubbing  NEURO: alert & oriented x 3 with fluent speech, no focal motor/sensory deficits EXTREMITIES: No lower extremity edema BREAST: No palpable masses or nodules in either right or left breasts. No palpable axillary supraclavicular or infraclavicular adenopathy no breast tenderness or nipple discharge. (exam performed in the presence of a chaperone)  LABORATORY DATA:  I have reviewed the data as listed CMP Latest Ref Rng & Units 03/03/2014 08/19/2013 05/19/2013  Glucose 70 - 140 mg/dl 107 96 100(H)  BUN 7.0 - 26.0 mg/dL 24.6 21.9 24(H)  Creatinine 0.6 - 1.1 mg/dL 1.2(H) 1.0 1.27(H)  Sodium 136 - 145 mEq/L 141 141 142  Potassium 3.5 - 5.1 mEq/L 3.9 3.6 3.9  Chloride 96 - 112 mEq/L - - 102  CO2 22 - 29 mEq/L 27 28 31   Calcium 8.4 - 10.4 mg/dL 9.6 10.1 9.8  Total Protein 6.4 - 8.3 g/dL 7.0 7.4 -  Total Bilirubin 0.20 - 1.20 mg/dL 0.55 0.57 -  Alkaline Phos 40 - 150 U/L 95 115 -  AST 5 - 34 U/L 19 28 -  ALT 0 - 55 U/L 15 27 -    Lab Results  Component Value Date   WBC 6.2 03/03/2014   HGB 12.8 03/03/2014   HCT 39.1 03/03/2014   MCV 92.9 03/03/2014   PLT 273 03/03/2014   NEUTROABS 3.3 03/03/2014    ASSESSMENT & PLAN:  Breast cancer of upper-inner quadrant of right female breast 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 Musculoskeletal aches and pains in both lower extremities , knee surgery 2016 2. Bone density done at her primary care office was normal  Breast Cancer Surveillance:  1. Breast exam 08/08/2017: Normal  2. Mammogram 05/07/2017  no mammographic evidence of malignancy,  breast density category B 3. Patient stated that she had a bone density test with her primary care physician and was normal.   Return to clinic in 1 year for follow-up with long-term survivorship clinic  I spent 25 minutes talking to the patient of which more than half was spent in counseling and coordination of care.  No orders of the defined types were placed in this encounter.  The patient has a good understanding of the overall plan. she agrees with it. she will call with any problems that may develop before the next visit here.   Harriette Ohara, MD 08/08/17

## 2017-08-08 NOTE — Telephone Encounter (Signed)
Gave avs and calendar for February 2020 °

## 2017-08-08 NOTE — Assessment & Plan Note (Signed)
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 Musculoskeletal aches and pains in both lower extremities , knee surgery 2016 2. Bone density done at her primary care office was normal  Breast Cancer Surveillance:  1. Breast exam 08/08/2017: Normal  2. Mammogram 05/07/2017  no mammographic evidence of malignancy, breast density category B  3. Patient stated that she had a bone density test with her primary care physician and was normal.   Return to clinic in 1 year for follow-up

## 2017-10-15 ENCOUNTER — Other Ambulatory Visit: Payer: Self-pay | Admitting: Hematology and Oncology

## 2017-11-04 ENCOUNTER — Telehealth: Payer: Self-pay | Admitting: Cardiovascular Disease

## 2017-11-05 ENCOUNTER — Ambulatory Visit
Admission: RE | Admit: 2017-11-05 | Discharge: 2017-11-05 | Disposition: A | Discharge status: Home / Self Care | Payer: Medicare Other | Source: Ambulatory Visit | Attending: Family | Admitting: Family

## 2017-11-05 ENCOUNTER — Other Ambulatory Visit: Payer: Self-pay | Admitting: Family

## 2017-11-05 DIAGNOSIS — R059 Cough, unspecified: Secondary | ICD-10-CM

## 2017-11-05 DIAGNOSIS — R0600 Dyspnea, unspecified: Secondary | ICD-10-CM

## 2017-11-05 DIAGNOSIS — R609 Edema, unspecified: Secondary | ICD-10-CM

## 2017-11-05 DIAGNOSIS — R05 Cough: Secondary | ICD-10-CM

## 2017-11-06 ENCOUNTER — Telehealth: Payer: Self-pay

## 2017-11-06 NOTE — Telephone Encounter (Signed)
FAXED NOTES TO [facility] 

## 2017-11-08 ENCOUNTER — Telehealth: Payer: Self-pay | Admitting: Cardiovascular Disease

## 2017-11-08 NOTE — Telephone Encounter (Signed)
Received incoming records from [facility] for up coming appointment on 11/12/2017 with Dr Oval Linsey @ 11:00am.  Records given to [facility] in Medical Records. 11/08/2017 DAC

## 2017-11-12 ENCOUNTER — Encounter: Payer: Self-pay | Admitting: Cardiovascular Disease

## 2017-11-12 ENCOUNTER — Ambulatory Visit (INDEPENDENT_AMBULATORY_CARE_PROVIDER_SITE_OTHER): Payer: Medicare Other | Admitting: Cardiovascular Disease

## 2017-11-12 VITALS — BP 130/80 | HR 66 | Ht 62.0 in | Wt 182.2 lb

## 2017-11-12 DIAGNOSIS — I10 Essential (primary) hypertension: Secondary | ICD-10-CM | POA: Diagnosis not present

## 2017-11-12 DIAGNOSIS — R0789 Other chest pain: Secondary | ICD-10-CM

## 2017-11-12 HISTORY — DX: Essential (primary) hypertension: I10

## 2017-11-12 HISTORY — DX: Other chest pain: R07.89

## 2017-11-12 NOTE — Progress Notes (Signed)
Cardiology Office Note   Date:  11/12/2017   ID:  Casey Taylor, DOB [birth year], MRN [id number]  PCP:  Willey Blade, MD  Cardiologist:   Skeet Latch, MD   Chief Complaint  Patient presents with  . New Patient (Initial Visit)    pt c/o fullness in chest and SOB     History of Present Illness: Casey Taylor is a 82 y.o. female with hypertension, TIA, and prior breast cancer s/p partial mastectomy and who presents for an evaluation of shortness of breath and chest discomfort.  She saw Priscille Loveless, FNP, on 11/04/17 and reported chest discomfort and shortness of breath.  She was referred to cardiology for further evaluation.  After traveling out of town she developed some shortness of breath.  She followed up with her PCP who felt she may have had mild pneumonia.  She was treated with azithromycin and her shortness of breath has resolved.  She also reported bilateral arm discomfort.  The pain is worse in the right than the left arm.  Is been ongoing for the last year.  There is no nausea, diaphoresis, or shortness of breath.  She also notes associated tingling in both hands.  The symptoms are not exertional.  They seem to occur randomly.  In the past she got more formal exercise but lately has just been doing some gardening.  She has no chest pain or shortness of breath associated with exertion.  She has chronic swelling of the left lower extremity.  She reports having lower extremity Dopplers that were negative for DVT.  It is worse after standing for long periods of time and improves with elevation of her leg.  She denies any prior injury to her leg.  She does have a history of gout.  Past Medical History:  Diagnosis Date  . Anxiety   . Arthritis   . Atypical chest pain 11/12/2017  . Breast cancer ([place])   . Essential hypertension 11/12/2017  . Gout   . Hypertension    dr Karlton Lemon  . Stroke [facility])    tia    06    Past Surgical History:  Procedure Laterality Date    . ABDOMINAL HYSTERECTOMY    . BREAST BIOPSY Left 05/26/2013   Procedure: BREAST BIOPSY WITH NEEDLE LOCALIZATION;  Surgeon: Stark Klein, MD;  Location: [facility];  Service: General;  Laterality: Left;  . CHOLECYSTECTOMY    . PARTIAL MASTECTOMY WITH NEEDLE LOCALIZATION Right 05/26/2013   Procedure: PARTIAL MASTECTOMY WITH NEEDLE LOCALIZATION;  Surgeon: Stark Klein, MD;  Location: [facility];  Service: General;  Laterality: Right;     Current Outpatient Medications  Medication Sig Dispense Refill  . allopurinol (ZYLOPRIM) 100 MG tablet Take 1 tablet (100 mg total) by mouth daily. 30 tablet 6  . ALPRAZolam (XANAX) 0.25 MG tablet Take 0.25 mg by mouth at bedtime as needed for anxiety.    Marland Kitchen anastrozole (ARIMIDEX) 1 MG tablet Take 1 tablet (1 mg total) by mouth daily. 90 tablet 3  . aspirin 81 MG tablet Take 81 mg by mouth daily.    . chlorthalidone (HYGROTON) 25 MG tablet Take 1 tablet (25 mg total) by mouth daily.    . Cholecalciferol (VITAMIN D PO) Take 2,000 Int'l Units by mouth daily.    . colchicine 0.6 MG tablet Take 0.6 mg by mouth daily.    Marland Kitchen gabapentin (NEURONTIN) 300 MG capsule Take 300 mg by mouth 3 (three) times daily.    Marland Kitchen losartan (COZAAR)  25 MG tablet Take 1 tablet (25 mg total) by mouth daily.    . Magnesium 250 MG TABS Take 1 tablet by mouth daily.    . meloxicam (MOBIC) 15 MG tablet Take 1 tablet (15 mg total) by mouth daily. 15 tablet 0  . potassium gluconate 595 MG TABS tablet Take 595 mg by mouth daily.     No current facility-administered medications for this visit.     Allergies:   Codeine    Social History:  The patient  reports that she has never smoked. She has never used smokeless tobacco. She reports that she drinks alcohol. She reports that she does not use drugs.   Family History:  The patient's family history includes Dementia in her sister; Diabetes in her brother, brother, mother, and sister; Esophageal cancer in her brother; Heart attack in her father and mother;  Hypertension in her sister; Kidney disease in her brother; Prostate cancer in her brother.    ROS:  Please see the history of present illness.   Otherwise, review of systems are positive for none.   All other systems are reviewed and negative.    PHYSICAL EXAM: VS:  BP 130/80   Pulse 66   Ht 5\' 2"  (1.575 m)   Wt 182 lb 3.2 oz (82.6 kg)   BMI 33.32 kg/m  , BMI Body mass index is 33.32 kg/m. GENERAL:  Well appearing HEENT:  Pupils equal round and reactive, fundi not visualized, oral mucosa unremarkable NECK:  No jugular venous distention, waveform within normal limits, carotid upstroke brisk and symmetric, no bruits, no thyromegaly LYMPHATICS:  No cervical adenopathy LUNGS:  Clear to auscultation bilaterally HEART:  RRR.  PMI not displaced or sustained,S1 and S2 within normal limits, no S3, no S4, no clicks, no rubs, no murmurs ABD:  Flat, positive bowel sounds normal in frequency in pitch, no bruits, no rebound, no guarding, no midline pulsatile mass, no hepatomegaly, no splenomegaly EXT:  2 plus pulses throughout, no edema, no cyanosis no clubbing SKIN:  No rashes no nodules NEURO:  Cranial nerves II through XII grossly intact, motor grossly intact throughout PSYCH:  Cognitively intact, oriented to person place and time    EKG:  EKG is ordered today. The ekg ordered today demonstrates sinus rhythm.  Rate 66 bpm.    Recent Labs: No results found for requested labs within last 8760 hours.    Lipid Panel No results found for: CHOL, TRIG, HDL, CHOLHDL, VLDL, LDLCALC, LDLDIRECT    Wt Readings from Last 3 Encounters:  11/12/17 182 lb 3.2 oz (82.6 kg)  08/08/17 182 lb 12.8 oz (82.9 kg)  02/26/17 180 lb (81.6 kg)      ASSESSMENT AND PLAN:  # Atypical arm pain: Not associated with exertion.  Possibly related to anastrozole.  We will get an ETT to rule out ischemia.  If negative she will need to increase her exercise and start back going to the gym.  Continue aspirin for now.     # Hypertension: BP well-controlled on chlorthalidone and losartan.  # Shortness of breath: Resolved after Azithromycin.    Current medicines are reviewed at length with the patient today.  The patient does not have concerns regarding medicines.  The following changes have been made:  no change  Labs/ tests ordered today include:   Orders Placed This Encounter  Procedures  . Exercise Tolerance Test     Disposition:   FU with A[place]za [place] C. Oval Linsey, MD, [facility] as needed.  Signed, Jaxie Racanelli C. Oval Linsey, MD, [facility]  11/12/2017 11:44 AM    [place]

## 2017-11-12 NOTE — Patient Instructions (Addendum)
Medication Instructions:  Your physician recommends that you continue on your current medications as directed. Please refer to the Current Medication list given to you today.  Labwork: NONE  Testing/Procedures: Your physician has requested that you have an exercise tolerance test. For further information please visit [email or web address]. Please also follow instruction sheet, as given.  Follow-Up: AS NEEDED    Exercise Stress Electrocardiogram An exercise stress electrocardiogram is a test to check how blood flows to your heart. It is done to find areas of poor blood flow. You will need to walk on a treadmill for this test. The electrocardiogram will record your heartbeat when you are at rest and when you are exercising. What happens before the procedure?  Do not have drinks with caffeine or foods with caffeine for 24 hours before the test, or as told by your doctor. This includes coffee, tea (even decaf tea), sodas, chocolate, and cocoa.  Follow your doctor's instructions about eating and drinking before the test.  Ask your doctor what medicines you should or should not take before the test. Take your medicines with water unless told by your doctor not to.  If you use an inhaler, bring it with you to the test.  Bring a snack to eat after the test.  Do not  smoke for 4 hours before the test.  Do not put lotions, powders, creams, or oils on your chest before the test.  Wear comfortable shoes and clothing. What happens during the procedure?  You will have patches put on your chest. Small areas of your chest may need to be shaved. Wires will be connected to the patches.  Your heart rate will be watched while you are resting and while you are exercising.  You will walk on the treadmill. The treadmill will slowly get faster to raise your heart rate.  The test will take about 1-2 hours. What happens after the procedure?  Your heart rate and blood pressure will be watched after the  test.  You may return to your normal diet, activities, and medicines or as told by your doctor. This information is not intended to replace advice given to you by your health care provider. Make sure you discuss any questions you have with your health care provider. Document Released: 11/21/2007 Document Revised: 02/01/2016 Document Reviewed: 02/09/2013 Elsevier Interactive Patient Education  [employer].

## 2017-11-12 NOTE — Addendum Note (Signed)
Addended by: Alvina Filbert B on: 11/12/2017 12:30 PM   Modules accepted: Orders

## 2017-11-15 ENCOUNTER — Telehealth (HOSPITAL_COMMUNITY): Payer: Self-pay

## 2017-11-15 NOTE — Telephone Encounter (Signed)
Encounter complete. 

## 2017-11-20 ENCOUNTER — Ambulatory Visit (HOSPITAL_COMMUNITY)
Admission: RE | Admit: 2017-11-20 | Discharge: 2017-11-20 | Disposition: A | Discharge status: Home / Self Care | Payer: Medicare Other | Source: Ambulatory Visit | Attending: Cardiology | Admitting: Cardiology

## 2017-11-20 DIAGNOSIS — R0789 Other chest pain: Secondary | ICD-10-CM | POA: Diagnosis present

## 2017-11-20 LAB — EXERCISE TOLERANCE TEST
CSEPED: 3 min
CSEPEW: 5.5 METS
Exercise duration (sec): 49 s
MPHR: 141 {beats}/min
Peak HR: 123 {beats}/min
Percent HR: 87 %
RPE: 18
Rest HR: 61 {beats}/min

## 2017-12-03 ENCOUNTER — Other Ambulatory Visit: Payer: Self-pay

## 2017-12-03 MED ORDER — LETROZOLE 2.5 MG PO TABS: 2.5000 mg | ORAL_TABLET | Freq: Every day | ORAL | 2 refills | Status: DC

## 2018-03-09 NOTE — Telephone Encounter (Signed)
error 

## 2018-04-02 ENCOUNTER — Encounter: Payer: Self-pay | Admitting: Podiatry

## 2018-04-02 ENCOUNTER — Ambulatory Visit (INDEPENDENT_AMBULATORY_CARE_PROVIDER_SITE_OTHER): Payer: Medicare Other | Admitting: Podiatry

## 2018-04-02 ENCOUNTER — Ambulatory Visit (INDEPENDENT_AMBULATORY_CARE_PROVIDER_SITE_OTHER): Payer: Medicare Other

## 2018-04-02 VITALS — BP 113/63 | HR 70

## 2018-04-02 DIAGNOSIS — M7750 Other enthesopathy of unspecified foot: Secondary | ICD-10-CM

## 2018-04-02 DIAGNOSIS — M7752 Other enthesopathy of left foot: Secondary | ICD-10-CM | POA: Diagnosis not present

## 2018-04-02 DIAGNOSIS — M779 Enthesopathy, unspecified: Secondary | ICD-10-CM | POA: Diagnosis not present

## 2018-04-02 DIAGNOSIS — M7751 Other enthesopathy of right foot: Secondary | ICD-10-CM | POA: Diagnosis not present

## 2018-04-02 DIAGNOSIS — M778 Other enthesopathies, not elsewhere classified: Secondary | ICD-10-CM

## 2018-04-02 NOTE — Progress Notes (Signed)
dg 

## 2018-04-03 ENCOUNTER — Other Ambulatory Visit: Payer: Self-pay | Admitting: Family

## 2018-04-03 DIAGNOSIS — Z853 Personal history of malignant neoplasm of breast: Secondary | ICD-10-CM

## 2018-04-10 NOTE — Progress Notes (Signed)
   HPI: 82 year old female presenting today as a new patient with a chief complaint of swelling and pain to the bilateral feet and ankles that began a few years ago. She has been elevating and icing the areas with no significant relief. There are no modifying factors noted. Patient is here for further evaluation and treatment.   Past Medical History:  Diagnosis Date  . Anxiety   . Arthritis   . Atypical chest pain 11/12/2017  . Breast cancer ([place])   . Essential hypertension 11/12/2017  . Gout   . Hypertension    dr Karlton Lemon  . Stroke [facility])    tia    06     Physical Exam: General: The patient is alert and oriented x3 in no acute distress.  Dermatology: Skin is warm, dry and supple bilateral lower extremities. Negative for open lesions or macerations.  Vascular: Palpable pedal pulses bilaterally. No edema or erythema noted. Capillary refill within normal limits.  Neurological: Epicritic and protective threshold grossly intact bilaterally.   Musculoskeletal Exam: Pain with palpation to the bilateral forefoot. Range of motion within normal limits to all pedal and ankle joints bilateral. Muscle strength 5/5 in all groups bilateral.   Radiographic Exam:  Normal osseous mineralization. Joint spaces preserved. No fracture/dislocation/boney destruction.    Assessment: 1. Forefoot capsulitis bilateral  2. H/o gout   Plan of Care:  1. Patient evaluated. X-Rays reviewed.  2. Injection of 0.5 mLs Celestone Soluspan injected into the bilateral forefoot.  3. Compression anklet dispensed bilaterally.  4. Continue taking Allopurinol as directed by PCP.  5. Continue taking Colchicine 0.6 mg as needed for acute flare ups.  6. Return to clinic as needed.      Edrick Kins, DPM [facility]  Dr. Edrick Kins, DPM    [address], [place] [address]                Office [phone]  Fax [phone]

## 2018-05-06 ENCOUNTER — Other Ambulatory Visit: Payer: Self-pay | Admitting: Family

## 2018-05-06 ENCOUNTER — Other Ambulatory Visit: Payer: Self-pay

## 2018-05-06 DIAGNOSIS — Z853 Personal history of malignant neoplasm of breast: Secondary | ICD-10-CM

## 2018-05-08 ENCOUNTER — Ambulatory Visit
Admission: RE | Admit: 2018-05-08 | Discharge: 2018-05-08 | Disposition: A | Discharge status: Home / Self Care | Payer: Medicare Other | Source: Ambulatory Visit | Attending: Family | Admitting: Family

## 2018-05-08 DIAGNOSIS — Z853 Personal history of malignant neoplasm of breast: Secondary | ICD-10-CM

## 2018-06-20 ENCOUNTER — Ambulatory Visit (INDEPENDENT_AMBULATORY_CARE_PROVIDER_SITE_OTHER): Payer: Medicare Other | Admitting: Podiatry

## 2018-06-20 ENCOUNTER — Encounter: Payer: Self-pay | Admitting: Podiatry

## 2018-06-20 DIAGNOSIS — M659 Synovitis and tenosynovitis, unspecified: Secondary | ICD-10-CM

## 2018-06-20 DIAGNOSIS — M1000 Idiopathic gout, unspecified site: Secondary | ICD-10-CM

## 2018-06-20 MED ORDER — COLCHICINE 0.6 MG PO TABS
0.6000 mg | ORAL_TABLET | Freq: Every day | ORAL | 0 refills | Status: DC
Start: 2018-06-20 — End: 2020-03-06

## 2018-06-21 ENCOUNTER — Ambulatory Visit: Payer: Medicare Other | Admitting: Podiatry

## 2018-06-23 NOTE — Progress Notes (Signed)
   Subjective:  83 year old female presenting today for a chief complaint of pain and swelling of the bilateral ankles, left worse than right, that began yesterday. Bearing weight increases the pain. She has been taking Allopurinol but has not taken anything for her acute pain. Patient is here for further evaluation and treatment.   Past Medical History:  Diagnosis Date  . Anxiety   . Arthritis   . Atypical chest pain 11/12/2017  . Breast cancer ([place])   . Essential hypertension 11/12/2017  . Gout   . Hypertension    dr Karlton Lemon  . Stroke [facility])    tia    06      Objective / Physical Exam:  General:  The patient is alert and oriented x3 in no acute distress. Dermatology:  Skin is warm, dry and supple bilateral lower extremities. Negative for open lesions or macerations. Vascular:  Palpable pedal pulses bilaterally. Capillary refill within normal limits. Neurological:  Epicritic and protective threshold grossly intact bilaterally.  Musculoskeletal Exam:  Pain on palpation to the left ankle joint with erythema and edema. Range of motion within normal limits to all pedal and ankle joints bilateral. Muscle strength 5/5 in all groups bilateral.   Assessment: 1. Ankle synovitis / acute gout flare left   Plan of Care:  1. Patient was evaluated. 2. injection of 0.5 mL Celestone Soluspan injected in the patient's right ankle. 3. CAM boot dispensed.  4. Prescription for Colchicine 0.6 mg provided to patient.  5. Return to clinic in 3 weeks.   Edrick Kins, DPM [facility]  Dr. Edrick Kins, [place]                                        [place], [place] [address]                Office [phone]  Fax [phone]

## 2018-07-07 ENCOUNTER — Ambulatory Visit (INDEPENDENT_AMBULATORY_CARE_PROVIDER_SITE_OTHER): Payer: Medicare Other | Admitting: Podiatry

## 2018-07-07 DIAGNOSIS — M659 Synovitis and tenosynovitis, unspecified: Secondary | ICD-10-CM

## 2018-07-07 DIAGNOSIS — M1000 Idiopathic gout, unspecified site: Secondary | ICD-10-CM | POA: Diagnosis not present

## 2018-07-14 NOTE — Progress Notes (Signed)
   Subjective:  83 year old female presenting today for follow up evaluation of bilateral foot pain. She states her symptoms have improved. She notes significant relief from the injection she received. She has been using the compression anklet which has helped alleviate her symptoms. There are no modifying factors noted. Patient is here for further evaluation and treatment.   Past Medical History:  Diagnosis Date  . Anxiety   . Arthritis   . Atypical chest pain 11/12/2017  . Breast cancer ([place])   . Essential hypertension 11/12/2017  . Gout   . Hypertension    dr Karlton Lemon  . Stroke [facility])    tia    06      Objective / Physical Exam:  General:  The patient is alert and oriented x3 in no acute distress. Dermatology:  Skin is warm, dry and supple bilateral lower extremities. Negative for open lesions or macerations. Vascular:  Palpable pedal pulses bilaterally. Capillary refill within normal limits. Neurological:  Epicritic and protective threshold grossly intact bilaterally.  Musculoskeletal Exam:  Range of motion within normal limits to all pedal and ankle joints bilateral. Muscle strength 5/5 in all groups bilateral.   Assessment: 1. Forefoot capsulitis bilateral - resolved  2. H/o gout  Plan of Care:  1. Patient was evaluated. 2. Continue taking Uloric as directed by PCP.  3. Continue using compression anklet.  4. Continue taking Colchicine 0.6 mg as needed for acute flare ups.  5. Return to clinic as needed.   Edrick Kins, DPM [facility]  Dr. Edrick Kins, [address]                                        [place], [place] [address]                Office [phone]  Fax [phone]

## 2018-08-08 ENCOUNTER — Inpatient Hospital Stay: Payer: Medicare Other | Attending: Adult Health | Admitting: Adult Health

## 2018-08-08 ENCOUNTER — Telehealth: Payer: Self-pay | Admitting: Adult Health

## 2018-08-08 ENCOUNTER — Encounter: Payer: Self-pay | Admitting: Adult Health

## 2018-08-08 VITALS — BP 133/52 | HR 62 | Temp 98.2°F | Resp 17 | Ht 62.0 in | Wt 187.4 lb

## 2018-08-08 DIAGNOSIS — Z86000 Personal history of in-situ neoplasm of breast: Secondary | ICD-10-CM | POA: Diagnosis present

## 2018-08-08 DIAGNOSIS — M2550 Pain in unspecified joint: Secondary | ICD-10-CM | POA: Diagnosis not present

## 2018-08-08 DIAGNOSIS — C50211 Malignant neoplasm of upper-inner quadrant of right female breast: Secondary | ICD-10-CM

## 2018-08-08 DIAGNOSIS — Z170 Estrogen receptor positive status [ER+]: Secondary | ICD-10-CM

## 2018-08-08 NOTE — Progress Notes (Addendum)
CLINIC:  Survivorship   REASON FOR VISIT:  Routine follow-up for history of breast cancer.   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 presents to the [facility] today for routine follow-up for her history of breast cancer.  Overall, she reports feeling quite well. She is accompanied by her daughter.  She is taking Arimidex daily.  She notes she does have arthralgias.  She wants to know if she can stop taking it.  Since her last visit she underwent bilateral breast diagnostic 3d mammogram that showed no evidence of malignancy and breast density category B.      REVIEW OF SYSTEMS:  Review of Systems  Constitutional: Negative for appetite change, chills, fatigue, fever and unexpected weight change.  HENT:   Negative for hearing loss, lump/mass, mouth sores, sore throat and trouble swallowing.   Eyes: Negative for eye problems and icterus.  Respiratory: Negative for chest tightness, cough and shortness of breath.   Cardiovascular: Negative for chest pain, leg swelling and palpitations.  Gastrointestinal: Negative for abdominal distention, abdominal pain, blood in stool, constipation, diarrhea, nausea and vomiting.  Endocrine: Negative for hot flashes.  Musculoskeletal: Positive for arthralgias.  Skin: Negative for itching and rash.  Neurological: Negative for dizziness, extremity weakness, headaches and numbness.  Hematological: Negative for adenopathy.  Psychiatric/Behavioral: Negative for depression. The patient is not nervous/anxious.     Breast: Denies any new nodularity, masses, tenderness, nipple changes, or nipple discharge.       PAST MEDICAL/SURGICAL HISTORY:  Past Medical History:  Diagnosis Date  . Anxiety   . Arthritis   . Atypical chest pain 11/12/2017  . Breast cancer ([place])   . Essential hypertension 11/12/2017  . Gout   . Hypertension    dr Karlton Lemon  . Stroke [facility])    tia    06   Past Surgical History:  Procedure Laterality Date  . ABDOMINAL HYSTERECTOMY    . BREAST BIOPSY Left 05/26/2013   Procedure: BREAST BIOPSY WITH NEEDLE LOCALIZATION;  Surgeon: Stark Klein, MD;  Location: [facility];  Service: General;  Laterality: Left;  . BREAST LUMPECTOMY Right 2014  . CHOLECYSTECTOMY    . PARTIAL MASTECTOMY WITH NEEDLE LOCALIZATION Right 05/26/2013   Procedure: PARTIAL MASTECTOMY WITH NEEDLE LOCALIZATION;  Surgeon: Stark Klein, MD;  Location: [facility];  Service: General;  Laterality: Right;     ALLERGIES:  Allergies  Allergen Reactions  . Codeine Nausea Only     CURRENT MEDICATIONS:  Outpatient Encounter Medications as of 08/08/2018  Medication Sig  . allopurinol (ZYLOPRIM) 100 MG tablet Take 1 tablet (100 mg total) by mouth daily.  Marland Kitchen ALPRAZolam (XANAX) 0.25 MG tablet Take 0.25 mg by mouth at bedtime as needed for anxiety.  Marland Kitchen aspirin 81 MG tablet Take 81 mg by mouth daily.  . chlorthalidone (HYGROTON) 25 MG tablet Take 1 tablet (25 mg total) by mouth daily.  . Cholecalciferol (VITAMIN D PO) Take 2,000 Int'l Units by mouth daily.  . colchicine 0.6 MG tablet Take 1 tablet (0.6 mg total) by mouth daily.  Two tablets stat, then once daily  . febuxostat (ULORIC) 40 MG tablet Take 40 mg by mouth daily.  Marland Kitchen gabapentin (NEURONTIN) 300 MG capsule Take 300 mg by mouth 3 (three) times daily.  Marland Kitchen losartan (COZAAR) 25 MG tablet Take 1 tablet (25 mg total) by mouth daily.  . Magnesium 250 MG TABS Take 1 tablet by mouth daily.  . meloxicam (MOBIC) 15 MG tablet Take 1 tablet (15 mg total) by mouth daily.  . potassium  gluconate 595 MG TABS tablet Take 595 mg by mouth daily.  . [DISCONTINUED] anastrozole (ARIMIDEX) 1 MG tablet Take 1 tablet (1 mg total) by mouth daily.  . [DISCONTINUED] letrozole (FEMARA) 2.5 MG tablet Take 1 tablet (2.5 mg total) by mouth daily.   No facility-administered encounter medications on file as of 08/08/2018.      ONCOLOGIC FAMILY HISTORY:  Family History  Problem Relation Age of Onset  . Heart attack Mother   . Diabetes Mother   . Heart attack Father   . Hypertension Sister   . Diabetes Sister   . Dementia Sister   . Diabetes Brother   . Kidney disease Brother   . Esophageal cancer Brother   . Prostate cancer Brother   . Diabetes Brother     GENETIC COUNSELING/TESTING: Not at this time  SOCIAL HISTORY:  Social History   Socioeconomic History  . Marital status: Divorced    Spouse name: Not on file  . Number of children: Not on file  . Years of education: Not on file  . Highest education level: Not on file  Occupational History  . Not on file  Social Needs  . Financial resource strain: Not on file  . Food insecurity:    Worry: Not on file    Inability: Not on file  . Transportation needs:    Medical: Not on file    Non-medical: Not on file  Tobacco Use  . Smoking status: Never Smoker  . Smokeless tobacco: Never Used  Substance and Sexual Activity  . Alcohol use: Yes    Comment: weekly  . Drug use: No  . Sexual activity: Not on file  Lifestyle  . Physical activity:    Days per week: Not on file    Minutes per session: Not on file  . Stress: Not on file  Relationships  . Social connections:    Talks on phone: Not on file    Gets together: Not on file    Attends religious service: Not on file    Active member of club or organization: Not on file    Attends meetings of clubs or organizations: Not on file    Relationship status: Not on file  . Intimate partner violence:    Fear of current or ex partner: Not on file    Emotionally abused: Not  on file    Physically abused: Not on file    Forced sexual activity: Not on file  Other Topics Concern  . Not on file  Social History Narrative  . Not on file      PHYSICAL EXAMINATION:  Vital Signs: Vitals:   08/08/18 1113  BP: (!) 133/52  Pulse: 62  Resp: 17  Temp: 98.2 F (36.8 C)  SpO2: 100%   Filed Weights   08/08/18 1112 08/08/18 1113  Weight: 187 lb 6.4 oz (85 kg) 187 lb 6.4 oz (85 kg)   General: Well-nourished, well-appearing female in no acute distress.  Accompanied by her daughter today.  HEENT: Head is normocephalic.  Pupils equal and reactive to light. Conjunctivae clear without exudate.  Sclerae anicteric. Oral mucosa is pink, moist.  Oropharynx is pink without lesions or erythema.  Lymph: No cervical, supraclavicular, or infraclavicular lymphadenopathy noted on palpation.  Cardiovascular: Regular rate and rhythm.Marland Kitchen Respiratory: Clear to auscultation bilaterally. Chest expansion symmetric; breathing non-labored.  Breast Exam:  -Left breast: No appreciable masses on palpation. No skin redness, thickening, or peau d'orange appearance; no nipple retraction or nipple discharge; -Right breast: No appreciable masses on palpation. No skin redness, thickening, or peau d'orange appearance; no nipple retraction or nipple discharge; mild distortion in symmetry at previous lumpectomy site well healed scar without erythema or nodularity. -Axilla: No axillary adenopathy bilaterally.  GI: Abdomen soft and round; non-tender, non-distended. Bowel sounds normoactive. No hepatosplenomegaly.   GU: Deferred.  Neuro: No focal deficits. Steady gait.  Psych: Mood and affect normal and appropriate for situation.  MSK: No focal spinal tenderness to palpation, full range of motion in bilateral upper extremities Extremities: No edema. Skin: Warm and dry.  LABORATORY DATA:  None for this visit   DIAGNOSTIC IMAGING:  Most recent mammogram:       ASSESSMENT AND PLAN:  Ms..  Casey Taylor is a pleasant 83 y.o. female with history of Stage 0 right breast DCIS, ER+/PR+, diagnosed in 03/2013, treated with lumpectomy, and anti-estrogen therapy with Anastrozole x 5 years.  [facility] for surveillance and routine follow-up.   1. History of breast cancer:  Ms. Obeid is currently clinically and radiographically without evidence of disease or recurrence of breast cancer. She will be due for mammogram in 04/2019.  I recommended that she stop taking Arimidex since she has completed 5 years of therapy.  I also recommended that she return here on an as needed basis, and undergo annual breast exams with her PCP.  She met with Dr. Lindi Adie who reviewed this as well.    2. Bone health:  Given Ms. Godwin's age, history of breast cancer, and her previous anti-estrogen therapy with Anastrozole, she is at risk for bone demineralization. Her last DEXA scan was on 07/24/2016 and demonstrated a T score of -0.5 in the left femur which is normal.  She was given education on specific food and activities to promote bone health.  3. Cancer screening:  Due to Ms. Geno's history and her age, she should receive screening for skin cancers, colon cancer. She was encouraged to follow-up with her PCP for appropriate cancer screenings.   4. Health maintenance and wellness promotion: Ms. Matzen was encouraged to consume 5-7 servings of fruits and vegetables per day. She was also encouraged to engage in moderate to vigorous exercise for 30 minutes per day most days of the week. She was instructed to limit her alcohol consumption and continue to abstain from tobacco use.    Dispo:  -Return to cancer center PRN    Gardenia Phlegm, [place] [phone]   Note: PRIMARY CARE PROVIDER Willey Blade, [place] [phone]  Attending Note  I personally saw and examined Winifred Olive Holan. The plan of care was  discussed with her. I agree with the assessment and plan as documented above. DCIS completed 5 years of antiestrogen therapy tolerated extremely well. Breast cancer surveillance: Breast exams benign, mammograms do not show any evidence of malignancy. Return to clinic on an as-needed basis.  Patient will follow-up with her primary care physician. Signed Harriette Ohara, MD

## 2018-08-08 NOTE — Telephone Encounter (Signed)
No los °

## 2019-01-14 NOTE — H&P (Signed)
TOTAL KNEE ADMISSION H&P  Patient is being admitted for left total knee arthroplasty.  Subjective:  Chief Complaint:left knee pain.  HPI: Casey Taylor, 83 y.o. female, has a history of pain and functional disability in the left knee due to arthritis and has failed non-surgical conservative treatments for greater than 12 weeks to includeNSAID's and/or analgesics, corticosteriod injections, viscosupplementation injections, flexibility and strengthening excercises, supervised PT with diminished ADL's post treatment, use of assistive devices and activity modification.  Onset of symptoms was gradual, starting 10 years ago with gradually worsening course since that time. The patient noted prior procedures on the knee to include  arthroscopy and menisectomy on the left knee(s).  Patient currently rates pain in the left knee(s) at 10 out of 10 with activity. Patient has night pain, worsening of pain with activity and weight bearing, pain that interferes with activities of daily living, crepitus and joint swelling.  Patient has evidence of subchondral sclerosis, joint subluxation and joint space narrowing by imaging studies.  There is no active infection.  Patient Active Problem List   Diagnosis Date Noted  . Essential hypertension 11/12/2017  . Atypical chest pain 11/12/2017  . Gout 08/07/2013  . Bunion 08/07/2013  . Osteoarthritis (arthritis due to wear and tear of joints) 08/07/2013  . Breast cancer of upper-inner quadrant of right female breast ([place]) 04/16/2013   Past Medical History:  Diagnosis Date  . Anxiety   . Arthritis   . Atypical chest pain 11/12/2017  . Breast cancer ([place])   . Essential hypertension 11/12/2017  . Gout   . Hypertension    dr Karlton Lemon  . Stroke [facility])    tia    06    Past Surgical History:  Procedure Laterality Date  . ABDOMINAL HYSTERECTOMY    . BREAST BIOPSY Left 05/26/2013   Procedure: BREAST BIOPSY WITH NEEDLE LOCALIZATION;  Surgeon: Stark Klein, MD;   Location: [facility];  Service: General;  Laterality: Left;  . BREAST LUMPECTOMY Right 2014  . CHOLECYSTECTOMY    . PARTIAL MASTECTOMY WITH NEEDLE LOCALIZATION Right 05/26/2013   Procedure: PARTIAL MASTECTOMY WITH NEEDLE LOCALIZATION;  Surgeon: Stark Klein, MD;  Location: [facility];  Service: General;  Laterality: Right;    No current facility-administered medications for this encounter.    Current Outpatient Medications  Medication Sig Dispense Refill Last Dose  . allopurinol (ZYLOPRIM) 100 MG tablet Take 1 tablet (100 mg total) by mouth daily. 30 tablet 6 Taking  . ALPRAZolam (XANAX) 0.25 MG tablet Take 0.25 mg by mouth at bedtime as needed for anxiety.   Taking  . aspirin 81 MG tablet Take 81 mg by mouth daily.   Taking  . chlorthalidone (HYGROTON) 25 MG tablet Take 1 tablet (25 mg total) by mouth daily.   Taking  . Cholecalciferol (VITAMIN D PO) Take 2,000 Int'l Units by mouth daily.   Taking  . colchicine 0.6 MG tablet Take 1 tablet (0.6 mg total) by mouth daily. Two tablets stat, then once daily 30 tablet 0 Taking  . febuxostat (ULORIC) 40 MG tablet Take 40 mg by mouth daily.   Taking  . gabapentin (NEURONTIN) 300 MG capsule Take 300 mg by mouth 3 (three) times daily.   Taking  . losartan (COZAAR) 25 MG tablet Take 1 tablet (25 mg total) by mouth daily.   Taking  . Magnesium 250 MG TABS Take 1 tablet by mouth daily.   Taking  . meloxicam (MOBIC) 15 MG tablet Take 1 tablet (15 mg total) by  mouth daily. 15 tablet 0 Taking  . potassium gluconate 595 MG TABS tablet Take 595 mg by mouth daily.   Taking   Allergies  Allergen Reactions  . Codeine Nausea Only    Social History   Tobacco Use  . Smoking status: Never Smoker  . Smokeless tobacco: Never Used  Substance Use Topics  . Alcohol use: Yes    Comment: weekly    Family History  Problem Relation Age of Onset  . Heart attack Mother   . Diabetes Mother   . Heart attack Father   . Hypertension Sister   . Diabetes Sister   . Dementia  Sister   . Diabetes Brother   . Kidney disease Brother   . Esophageal cancer Brother   . Prostate cancer Brother   . Diabetes Brother      Review of Systems  Constitutional: Negative.   HENT: Negative.   Eyes: Negative.   Respiratory: Negative.   Cardiovascular: Negative.   Gastrointestinal: Negative.   Genitourinary: Negative.   Musculoskeletal: Positive for joint pain.  Skin: Negative.   Neurological: Negative.   Endo/Heme/Allergies: Negative.   Psychiatric/Behavioral: Negative.     Objective:  Physical Exam  Constitutional: She is oriented to person, place, and time. She appears well-developed and well-nourished.  HENT:  Head: Normocephalic and atraumatic.  Mouth/Throat: Oropharynx is clear and moist.  Eyes: Pupils are equal, round, and reactive to light. Conjunctivae are normal.  Neck: Neck supple.  Cardiovascular: Normal rate.  Respiratory: Breath sounds normal.  GI: Bowel sounds are normal.  Genitourinary:    Genitourinary Comments: Not pertinent to current symptomatology therefore not examined.   Musculoskeletal:     Comments: Examination of her left knee reveals pain medially and laterally.  1+ crepitation.  1+ synovitis.  Range of motion 0-120 degrees.  Knee is stable with normal patella tracking.  Examination of the right knee reveals pain over the medial pes bursa.  Minimal intraarticular pain.  Full range of motion.  Knee is stable with normal patella tracking.  Vascular exam: Pulses are 2+ and symmetric.  Neurologic exam: Distal motor and sensory examination is within normal limits.    Neurological: She is alert and oriented to person, place, and time.  Skin: Skin is warm and dry.  Psychiatric: She has a normal mood and affect. Her behavior is normal.    Vital signs in last 24 hours: Temp:  [98.7 F (37.1 C)] 98.7 F (37.1 C) (07/29 1500) Pulse Rate:  [68] 68 (07/29 1500) BP: (143)/(80) 143/80 (07/29 1500) SpO2:  [99 %] 99 % (07/29 1500) Weight:  [85.7  kg] 85.7 kg (07/29 1500)  Labs:   Estimated body mass index is 34.57 kg/m as calculated from the following:   Height as of this encounter: 5\' 2"  (1.575 m).   Weight as of this encounter: 85.7 kg.   Imaging Review Plain radiographs demonstrate severe degenerative joint disease of the left knee(s). The overall alignment issignificant varus. The bone quality appears to be good for age and reported activity level.      Assessment/Plan:  End stage arthritis, left knee   The patient history, physical examination, clinical judgment of the provider and imaging studies are consistent with end stage degenerative joint disease of the left knee(s) and total knee arthroplasty is deemed medically necessary. The treatment options including medical management, injection therapy arthroscopy and arthroplasty were discussed at length. The risks and benefits of total knee arthroplasty were presented and reviewed. The risks due to  aseptic loosening, infection, stiffness, patella tracking problems, thromboembolic complications and other imponderables were discussed. The patient acknowledged the explanation, agreed to proceed with the plan and consent was signed. Patient is being admitted for inpatient treatment for surgery, pain control, PT, OT, prophylactic antibiotics, VTE prophylaxis, progressive ambulation and ADL's and discharge planning. The patient is planning to be discharged home with home health services

## 2019-01-15 ENCOUNTER — Other Ambulatory Visit: Payer: Self-pay | Admitting: Hematology and Oncology

## 2019-01-16 ENCOUNTER — Other Ambulatory Visit: Payer: Self-pay | Admitting: Orthopedic Surgery

## 2019-01-16 NOTE — Care Plan (Signed)
Met with patient and daughter in the office prior to surgery. She will discharge to home with her and HHPT. She has all needed equipment at home. HHPT referral to [facility]. Patient and MD agreeable to plan. Choice offered.   Ladell Heads, [place]

## 2019-01-19 ENCOUNTER — Other Ambulatory Visit (HOSPITAL_COMMUNITY): Payer: Self-pay | Admitting: *Deleted

## 2019-01-19 NOTE — Progress Notes (Signed)
LOV CARDIOLOGY DR Susan Moore 11-13-17 EXERCISE TOLERANCE TEST 11-20-17 EPIC

## 2019-01-19 NOTE — Patient Instructions (Addendum)
YOU NEED TO HAVE A COVID 19 TEST ON_______ @_______ , THIS TEST MUST BE DONE BEFORE SURGERY, COME  Casey Taylor, Casey Taylor , [id number]. ONCE YOUR COVID TEST IS COMPLETED, PLEASE BEGIN THE QUARANTINE INSTRUCTIONS AS OUTLINED IN YOUR HANDOUT.                Casey Taylor    Your procedure is scheduled on: 01-26-2019   Report to [facility] Main  Entrance    Report to  [place] at 5:30  AM    1 VISITOR IS ALLOWED TO WAIT IN WAITING ROOM  ONLY DAY OF YOUR SURGERY.    Call this number if you have problems the morning of surgery [phone]    Remember:    AFTER MIDNIGHT . NOTHING CLEAR LIQUIDS UNTIL 4:15 AM. PLEASE FINISH ENSURE DRINK AT 415 AM.   CLEAR LIQUID DIET   Foods Allowed                                                                     Foods Excluded  Coffee and tea, regular and decaf                             liquids that you cannot  Plain Jell-O any favor except red or purple                                           see through such as: Fruit ices (not with fruit pulp)                                     milk, soups, orange juice  Iced Popsicles                                    All solid food Carbonated beverages, regular and diet                                    Cranberry, grape and apple juices Sports drinks like Gatorade Lightly seasoned clear broth or consume(fat free) Sugar, honey syrup  Sample Menu Breakfast                                Lunch                                     Supper Cranberry juice                    Beef broth                            Chicken broth Jell-O  Grape juice                           Apple juice Coffee or tea                        Jell-O                                      Popsicle                                                Coffee or tea                        Coffee or  tea  _____________________________________________________________________     Take these medicines the morning of surgery with A SIP OF WATER: Gabapentin (Neurontin), Allopurinol and Colchicine, prn.   BRUSH YOUR TEETH MORNING OF SURGERY AND RINSE YOUR MOUTH OUT, NO CHEWING GUM CANDY OR MINTS.                                You may not have any metal on your body including hair pins and              piercings     Do not wear jewelry, make-up, lotions, powders or perfumes, deodorant              Do not wear nail polish.  Do not shave  48 hours prior to surgery.           Do not bring valuables to the hospital. Casey Taylor.  Contacts, dentures or bridgework may not be worn into surgery.    _____________________________________________________________________             [facility] - Preparing for Surgery Before surgery, you can play an important role.  Because skin is not sterile, your skin needs to be as free of germs as possible.  You can reduce the number of germs on your skin by washing with CHG (chlorahexidine gluconate) soap before surgery.  CHG is an antiseptic cleaner which kills germs and bonds with the skin to continue killing germs even after washing. Please DO NOT use if you have an allergy to CHG or antibacterial soaps.  If your skin becomes reddened/irritated stop using the CHG and inform your nurse when you arrive at Short Stay. Do not shave (including legs and underarms) for at least 48 hours prior to the first CHG shower.  You may shave your face/neck. Please follow these instructions carefully:  1.  Shower with CHG Soap the night before surgery and the  morning of Surgery.  2.  If you choose to wash your hair, wash your hair first as usual with your  normal  shampoo.  3.  After you shampoo, rinse your hair and body thoroughly to remove the  shampoo.                           4.  Use  CHG as you would any other liquid  soap.  You can apply chg directly  to the skin and wash                       Gently with a scrungie or clean washcloth.  5.  Apply the CHG Soap to your body ONLY FROM THE NECK DOWN.   Do not use on face/ open                           Wound or open sores. Avoid contact with eyes, ears mouth and genitals (private parts).                       Wash face,  Genitals (private parts) with your normal soap.             6.  Wash thoroughly, paying special attention to the area where your surgery  will be performed.  7.  Thoroughly rinse your body with warm water from the neck down.  8.  DO NOT shower/wash with your normal soap after using and rinsing off  the CHG Soap.                9.  Pat yourself dry with a clean towel.            10.  Wear clean pajamas.            11.  Place clean sheets on your bed the night of your first shower and do not  sleep with pets. Day of Surgery : Do not apply any lotions/deodorants the morning of surgery.  Please wear clean clothes to the hospital/surgery center.  FAILURE TO FOLLOW THESE INSTRUCTIONS MAY RESULT IN THE CANCELLATION OF YOUR SURGERY PATIENT SIGNATURE_________________________________  NURSE SIGNATURE__________________________________  ________________________________________________________________________   Casey Taylor  An incentive spirometer is a tool that can help keep your lungs clear and active. This tool measures how well you are filling your lungs with each breath. Taking long deep breaths may help reverse or decrease the chance of developing breathing (pulmonary) problems (especially infection) following:  A long period of time when you are unable to move or be active. BEFORE THE PROCEDURE   If the spirometer includes an indicator to show your best effort, your nurse or respiratory therapist will set it to a desired goal.  If possible, sit up straight or lean slightly forward. Try not to slouch.  Hold the incentive spirometer  in an upright position. INSTRUCTIONS FOR USE  1. Sit on the edge of your bed if possible, or sit up as far as you can in bed or on a chair. 2. Hold the incentive spirometer in an upright position. 3. Breathe out normally. 4. Place the mouthpiece in your mouth and seal your lips tightly around it. 5. Breathe in slowly and as deeply as possible, raising the piston or the ball toward the top of the column. 6. Hold your breath for 3-5 seconds or for as long as possible. Allow the piston or ball to fall to the bottom of the column. 7. Remove the mouthpiece from your mouth and breathe out normally. 8. Rest for a few seconds and repeat Steps 1 through 7 at least 10 times every 1-2 hours when you are awake. Take your time and take a few normal breaths between deep breaths. 9. The spirometer may include an indicator to show your best  effort. Use the indicator as a goal to work toward during each repetition. 10. After each set of 10 deep breaths, practice coughing to be sure your lungs are clear. If you have an incision (the cut made at the time of surgery), support your incision when coughing by placing a pillow or rolled up towels firmly against it. Once you are able to get out of bed, walk around indoors and cough well. You may stop using the incentive spirometer when instructed by your caregiver.  RISKS AND COMPLICATIONS  Take your time so you do not get dizzy or light-headed.  If you are in pain, you may need to take or ask for pain medication before doing incentive spirometry. It is harder to take a deep breath if you are having pain. AFTER USE  Rest and breathe slowly and easily.  It can be helpful to keep track of a log of your progress. Your caregiver can provide you with a simple table to help with this. If you are using the spirometer at home, follow these instructions: [place] IF:   You are having difficultly using the spirometer.  You have trouble using the spirometer as  often as instructed.  Your pain medication is not giving enough relief while using the spirometer.  You develop fever of 100.5 F (38.1 C) or higher. SEEK IMMEDIATE MEDICAL CARE IF:   You cough up bloody sputum that had not been present before.  You develop fever of 102 F (38.9 C) or greater.  You develop worsening pain at or near the incision site. MAKE SURE YOU:   Understand these instructions.  Will watch your condition.  Will get help right away if you are not doing well or get worse. Document Released: 10/15/2006 Document Revised: 08/27/2011 Document Reviewed: 12/16/2006 [facility] Patient Information 2014 [place], [place].   ________________________________________________________________________

## 2019-01-20 ENCOUNTER — Other Ambulatory Visit: Payer: Self-pay

## 2019-01-20 ENCOUNTER — Encounter (HOSPITAL_COMMUNITY): Payer: Self-pay

## 2019-01-20 ENCOUNTER — Encounter (HOSPITAL_COMMUNITY)
Admission: RE | Admit: 2019-01-20 | Discharge: 2019-01-20 | Disposition: A | Discharge status: Home / Self Care | Payer: Medicare Other | Source: Ambulatory Visit | Attending: Orthopedic Surgery | Admitting: Orthopedic Surgery

## 2019-01-20 DIAGNOSIS — I10 Essential (primary) hypertension: Secondary | ICD-10-CM | POA: Diagnosis not present

## 2019-01-20 DIAGNOSIS — Z01818 Encounter for other preprocedural examination: Secondary | ICD-10-CM | POA: Diagnosis not present

## 2019-01-20 DIAGNOSIS — Z20828 Contact with and (suspected) exposure to other viral communicable diseases: Secondary | ICD-10-CM | POA: Insufficient documentation

## 2019-01-20 DIAGNOSIS — M1712 Unilateral primary osteoarthritis, left knee: Secondary | ICD-10-CM | POA: Insufficient documentation

## 2019-01-20 LAB — CBC WITH DIFFERENTIAL/PLATELET
Abs Immature Granulocytes: 0.02 10*3/uL (ref 0.00–0.07)
Basophils Absolute: 0.1 10*3/uL (ref 0.0–0.1)
Basophils Relative: 1 %
Eosinophils Absolute: 0.2 10*3/uL (ref 0.0–0.5)
Eosinophils Relative: 3 %
HCT: 37.3 % (ref 36.0–46.0)
Hemoglobin: 11.8 g/dL — ABNORMAL LOW (ref 12.0–15.0)
Immature Granulocytes: 0 %
Lymphocytes Relative: 33 %
Lymphs Abs: 2.1 10*3/uL (ref 0.7–4.0)
MCH: 30.7 pg (ref 26.0–34.0)
MCHC: 31.6 g/dL (ref 30.0–36.0)
MCV: 97.1 fL (ref 80.0–100.0)
Monocytes Absolute: 0.6 10*3/uL (ref 0.1–1.0)
Monocytes Relative: 9 %
Neutro Abs: 3.4 10*3/uL (ref 1.7–7.7)
Neutrophils Relative %: 54 %
Platelets: 347 10*3/uL (ref 150–400)
RBC: 3.84 MIL/uL — ABNORMAL LOW (ref 3.87–5.11)
RDW: 13.2 % (ref 11.5–15.5)
WBC: 6.3 10*3/uL (ref 4.0–10.5)
nRBC: 0 % (ref 0.0–0.2)

## 2019-01-20 LAB — COMPREHENSIVE METABOLIC PANEL
ALT: 22 U/L (ref 0–44)
AST: 28 U/L (ref 15–41)
Albumin: 4.2 g/dL (ref 3.5–5.0)
Alkaline Phosphatase: 69 U/L (ref 38–126)
Anion gap: 9 (ref 5–15)
BUN: 23 mg/dL (ref 8–23)
CO2: 27 mmol/L (ref 22–32)
Calcium: 9 mg/dL (ref 8.9–10.3)
Chloride: 102 mmol/L (ref 98–111)
Creatinine, Ser: 1.13 mg/dL — ABNORMAL HIGH (ref 0.44–1.00)
GFR calc Af Amer: 53 mL/min — ABNORMAL LOW (ref >60)
GFR calc non Af Amer: 46 mL/min — ABNORMAL LOW (ref >60)
Glucose, Bld: 95 mg/dL (ref 70–99)
Potassium: 4 mmol/L (ref 3.5–5.1)
Sodium: 138 mmol/L (ref 135–145)
Total Bilirubin: 0.7 mg/dL (ref 0.3–1.2)
Total Protein: 7.5 g/dL (ref 6.5–8.1)

## 2019-01-20 LAB — APTT: aPTT: 30 seconds (ref 24–36)

## 2019-01-20 LAB — SURGICAL PCR SCREEN
MRSA, PCR: NEGATIVE
Staphylococcus aureus: NEGATIVE

## 2019-01-20 LAB — PROTIME-INR
INR: 0.9 (ref 0.8–1.2)
Prothrombin Time: 12 seconds (ref 11.4–15.2)

## 2019-01-21 LAB — URINE CULTURE: Culture: <10000 — AB

## 2019-01-21 NOTE — Progress Notes (Signed)
Urine culture results routed to Dr. Noemi Chapel office for review

## 2019-01-22 ENCOUNTER — Other Ambulatory Visit (HOSPITAL_COMMUNITY)
Admission: RE | Admit: 2019-01-22 | Discharge: 2019-01-22 | Disposition: A | Discharge status: Home / Self Care | Payer: Medicare Other | Source: Ambulatory Visit | Attending: Orthopedic Surgery | Admitting: Orthopedic Surgery

## 2019-01-22 DIAGNOSIS — Z01818 Encounter for other preprocedural examination: Secondary | ICD-10-CM | POA: Diagnosis not present

## 2019-01-22 LAB — SARS CORONAVIRUS 2 (TAT 6-24 HRS): SARS Coronavirus 2: NEGATIVE

## 2019-01-22 NOTE — Anesthesia Preprocedure Evaluation (Addendum)
Anesthesia Evaluation  Patient identified by MRN, date of birth, ID band Patient awake    Reviewed: Allergy & Precautions, NPO status , Patient's Chart, lab work & pertinent test results  Airway Mallampati: III  TM Distance: >3 FB Neck ROM: Full    Dental  (+) Partial Upper, Partial Lower   Pulmonary neg pulmonary ROS,    Pulmonary exam normal breath sounds clear to auscultation       Cardiovascular hypertension, Pt. on medications Normal cardiovascular exam Rhythm:Regular Rate:Normal  Stress Test 11/20/2017 1. Mildly impaired exercise tolerance.  2. No evidence for ischemia by ST segment analysis.   EKG 01/20/2019 NSR, Normal    Neuro/Psych Anxiety 2006 TIA   GI/Hepatic negative GI ROS, Neg liver ROS,   Endo/Other  Gout Obesity Hx/o right breast Ca  Renal/GU negative Renal ROS  negative genitourinary   Musculoskeletal  (+) Arthritis , Osteoarthritis,  DJD left knee   Abdominal (+) + obese,   Peds  Hematology negative hematology ROS (+)   Anesthesia Other Findings   Reproductive/Obstetrics                          Anesthesia Physical Anesthesia Plan  ASA: II  Anesthesia Plan: Spinal   Post-op Pain Management:  Regional for Post-op pain   Induction:   PONV Risk Score and Plan: 3 and Ondansetron, Dexamethasone and Treatment may vary due to age or medical condition  Airway Management Planned: Natural Airway  Additional Equipment:   Intra-op Plan:   Post-operative Plan:   Informed Consent: I have reviewed the patients History and Physical, chart, labs and discussed the procedure including the risks, benefits and alternatives for the proposed anesthesia with the patient or authorized representative who has indicated his/her understanding and acceptance.     Dental advisory given  Plan Discussed with: CRNA and Surgeon  Anesthesia Plan Comments: (See PAT note 01/20/2019,  Konrad Felix, PA-C)      Anesthesia Quick Evaluation

## 2019-01-22 NOTE — Progress Notes (Signed)
Anesthesia Chart Review   Case: [id number] Date/Time: 01/26/19 0700   Procedure: TOTAL KNEE ARTHROPLASTY (Left )   Anesthesia type: Spinal   Pre-op diagnosis: DJD LEFT KNEE   Location: [facility] [facility] / [facility]   Surgeon: Elsie Saas, MD      DISCUSSION:83 y.o. never smoker with h/o HTN, TIA 2006, breast cancer, anxiety, left knee DJD scheduled for above procedure 01/26/2019 with Dr. Elsie Saas.   Shortness of breath evaluated by cardiologist, Dr. Skeet Latch, 11/12/2017.  ETT ordered with no ischemia noted.  Advised to follow up as needed.   Anticipate pt can proceed with planned procedure barring acute status change.   VS: BP 130/66 (BP Location: Left Arm)   Pulse 66   Temp 37.1 C (Oral)   Resp 18   Ht 5\' 2"  (1.575 m)   Wt 85.3 kg   SpO2 98%   BMI 34.40 kg/m   PROVIDERS: Willey Blade, MD is PCP   Skeet Latch, MD is Cardiologist  LABS: Labs reviewed: Acceptable for surgery. (all labs ordered are listed, but only abnormal results are displayed)  Labs Reviewed  URINE CULTURE - Abnormal; Notable for the following components:      Result Value   Culture   (*)    Value: <10,000 COLONIES/mL INSIGNIFICANT GROWTH Performed at [facility], [address], [place], [place] [address]    All other components within normal limits  CBC WITH DIFFERENTIAL/PLATELET - Abnormal; Notable for the following components:   RBC 3.84 (*)    Hemoglobin 11.8 (*)    All other components within normal limits  COMPREHENSIVE METABOLIC PANEL - Abnormal; Notable for the following components:   Creatinine, Ser 1.13 (*)    GFR calc non Af Amer 46 (*)    GFR calc Af Amer 53 (*)    All other components within normal limits  SURGICAL PCR SCREEN  APTT  PROTIME-INR     IMAGES:   EKG: 01/20/2019 Rate 72 bpm Normal sinus rhythm  Normal ECG   CV: ETT 11/20/2017  Blood pressure demonstrated a normal response to exercise.  There was no ST segment deviation noted during  stress.   1. Mildly impaired exercise tolerance.  2. No evidence for ischemia by ST segment analysis.   Past Medical History:  Diagnosis Date  . Anxiety   . Arthritis   . Atypical chest pain 11/12/2017  . Breast cancer ([place])   . Essential hypertension 11/12/2017  . Gout   . Hypertension    dr Karlton Lemon  . Stroke [facility])    tia    06    Past Surgical History:  Procedure Laterality Date  . ABDOMINAL HYSTERECTOMY    . BREAST BIOPSY Left 05/26/2013   Procedure: BREAST BIOPSY WITH NEEDLE LOCALIZATION;  Surgeon: Stark Klein, MD;  Location: [facility];  Service: General;  Laterality: Left;  . BREAST LUMPECTOMY Right 2014  . CHOLECYSTECTOMY    . PARTIAL MASTECTOMY WITH NEEDLE LOCALIZATION Right 05/26/2013   Procedure: PARTIAL MASTECTOMY WITH NEEDLE LOCALIZATION;  Surgeon: Stark Klein, MD;  Location: [place];  Service: General;  Laterality: Right;    MEDICATIONS: . allopurinol (ZYLOPRIM) 100 MG tablet  . ALPRAZolam (XANAX) 0.25 MG tablet  . aspirin 81 MG tablet  . chlorthalidone (HYGROTON) 25 MG tablet  . Cholecalciferol (VITAMIN D PO)  . colchicine 0.6 MG tablet  . Febuxostat 80 MG TABS  . gabapentin (NEURONTIN) 300 MG capsule  . losartan (COZAAR) 25 MG tablet  . Magnesium  250 MG TABS  . meloxicam (MOBIC) 15 MG tablet  . potassium gluconate 595 MG TABS tablet   No current facility-administered medications for this encounter.    Maia Plan [facility] Pre-Surgical Testing [phone] 01/22/19  11:08 AM

## 2019-01-25 ENCOUNTER — Encounter (HOSPITAL_COMMUNITY): Payer: Self-pay | Admitting: Anesthesiology

## 2019-01-25 MED ORDER — BUPIVACAINE LIPOSOME 1.3 % IJ SUSP
20.0000 mL | INTRAMUSCULAR | Status: DC
  Filled 2019-01-25: qty 20

## 2019-01-26 ENCOUNTER — Encounter (HOSPITAL_COMMUNITY)
Admission: RE | Disposition: A | Discharge status: Home / Self Care | Payer: Self-pay | Source: Home / Self Care | Attending: Orthopedic Surgery

## 2019-01-26 ENCOUNTER — Observation Stay (HOSPITAL_COMMUNITY)
Admission: RE | Admit: 2019-01-26 | Discharge: 2019-01-27 | Disposition: A | Discharge status: Home / Self Care | Payer: Medicare Other | Attending: Orthopedic Surgery | Admitting: Orthopedic Surgery

## 2019-01-26 ENCOUNTER — Other Ambulatory Visit: Payer: Self-pay

## 2019-01-26 ENCOUNTER — Inpatient Hospital Stay (HOSPITAL_COMMUNITY): Payer: Medicare Other | Admitting: Physician Assistant

## 2019-01-26 ENCOUNTER — Encounter (HOSPITAL_COMMUNITY): Payer: Self-pay | Admitting: *Deleted

## 2019-01-26 ENCOUNTER — Inpatient Hospital Stay (HOSPITAL_COMMUNITY): Payer: Medicare Other | Admitting: Anesthesiology

## 2019-01-26 DIAGNOSIS — I10 Essential (primary) hypertension: Secondary | ICD-10-CM | POA: Diagnosis not present

## 2019-01-26 DIAGNOSIS — Z791 Long term (current) use of non-steroidal anti-inflammatories (NSAID): Secondary | ICD-10-CM | POA: Diagnosis not present

## 2019-01-26 DIAGNOSIS — Z8673 Personal history of transient ischemic attack (TIA), and cerebral infarction without residual deficits: Secondary | ICD-10-CM | POA: Diagnosis present

## 2019-01-26 DIAGNOSIS — M109 Gout, unspecified: Secondary | ICD-10-CM | POA: Diagnosis not present

## 2019-01-26 DIAGNOSIS — M1712 Unilateral primary osteoarthritis, left knee: Secondary | ICD-10-CM

## 2019-01-26 DIAGNOSIS — Z79899 Other long term (current) drug therapy: Secondary | ICD-10-CM | POA: Diagnosis not present

## 2019-01-26 DIAGNOSIS — C50211 Malignant neoplasm of upper-inner quadrant of right female breast: Secondary | ICD-10-CM | POA: Diagnosis present

## 2019-01-26 DIAGNOSIS — Z7982 Long term (current) use of aspirin: Secondary | ICD-10-CM | POA: Diagnosis not present

## 2019-01-26 DIAGNOSIS — F419 Anxiety disorder, unspecified: Secondary | ICD-10-CM | POA: Insufficient documentation

## 2019-01-26 DIAGNOSIS — M1990 Unspecified osteoarthritis, unspecified site: Secondary | ICD-10-CM | POA: Diagnosis present

## 2019-01-26 DIAGNOSIS — Z853 Personal history of malignant neoplasm of breast: Secondary | ICD-10-CM | POA: Diagnosis not present

## 2019-01-26 DIAGNOSIS — I639 Cerebral infarction, unspecified: Secondary | ICD-10-CM | POA: Diagnosis present

## 2019-01-26 HISTORY — DX: Unilateral primary osteoarthritis, left knee: M17.12

## 2019-01-26 HISTORY — PX: TOTAL KNEE ARTHROPLASTY: SHX125

## 2019-01-26 SURGERY — ARTHROPLASTY, KNEE, TOTAL
Anesthesia: Spinal | Laterality: Left

## 2019-01-26 MED ORDER — PROPOFOL 10 MG/ML IV BOLUS
INTRAVENOUS | Status: DC | PRN
  Administered 2019-01-26: 30 mg via INTRAVENOUS

## 2019-01-26 MED ORDER — TRANEXAMIC ACID-NACL 1000-0.7 MG/100ML-% IV SOLN
1000.0000 mg | INTRAVENOUS | Status: AC
  Administered 2019-01-26: 1000 mg via INTRAVENOUS
  Filled 2019-01-26: qty 100

## 2019-01-26 MED ORDER — MIDAZOLAM HCL 5 MG/5ML IJ SOLN
INTRAMUSCULAR | Status: DC | PRN
  Administered 2019-01-26: 1 mg via INTRAVENOUS

## 2019-01-26 MED ORDER — BUPIVACAINE IN DEXTROSE 0.75-8.25 % IT SOLN
INTRATHECAL | Status: DC | PRN
  Administered 2019-01-26: 1.6 mL via INTRATHECAL

## 2019-01-26 MED ORDER — DEXAMETHASONE SODIUM PHOSPHATE 10 MG/ML IJ SOLN
INTRAMUSCULAR | Status: DC | PRN
  Administered 2019-01-26: 10 mg via INTRAVENOUS

## 2019-01-26 MED ORDER — CHLORHEXIDINE GLUCONATE 4 % EX LIQD: 60.0000 mL | Freq: Once | CUTANEOUS | Status: DC

## 2019-01-26 MED ORDER — BUPIVACAINE-EPINEPHRINE 0.25% -1:200000 IJ SOLN
INTRAMUSCULAR | Status: DC | PRN
  Administered 2019-01-26: 30 mL

## 2019-01-26 MED ORDER — OXYCODONE HCL 5 MG PO TABS
5.0000 mg | ORAL_TABLET | ORAL | Status: DC | PRN
  Administered 2019-01-26 – 2019-01-27 (×3): 10 mg via ORAL
  Filled 2019-01-26 (×3): qty 2

## 2019-01-26 MED ORDER — POVIDONE-IODINE 10 % EX SWAB: 2.0000 "application " | Freq: Once | CUTANEOUS | Status: DC

## 2019-01-26 MED ORDER — ONDANSETRON HCL 4 MG/2ML IJ SOLN
4.0000 mg | Freq: Four times a day (QID) | INTRAMUSCULAR | Status: DC | PRN
  Administered 2019-01-26: 4 mg via INTRAVENOUS
  Filled 2019-01-26: qty 2

## 2019-01-26 MED ORDER — SODIUM CHLORIDE (PF) 0.9 % IJ SOLN
INTRAMUSCULAR | Status: DC | PRN
  Administered 2019-01-26: 50 mL

## 2019-01-26 MED ORDER — DEXAMETHASONE SODIUM PHOSPHATE 10 MG/ML IJ SOLN
10.0000 mg | Freq: Three times a day (TID) | INTRAMUSCULAR | Status: DC
  Administered 2019-01-26 – 2019-01-27 (×3): 10 mg via INTRAVENOUS
  Filled 2019-01-26 (×3): qty 1

## 2019-01-26 MED ORDER — PHENYLEPHRINE 40 MCG/ML (10ML) SYRINGE FOR IV PUSH (FOR BLOOD PRESSURE SUPPORT)
PREFILLED_SYRINGE | INTRAVENOUS | Status: AC
  Filled 2019-01-26: qty 10

## 2019-01-26 MED ORDER — ALPRAZOLAM 0.25 MG PO TABS: 0.2500 mg | ORAL_TABLET | Freq: Every evening | ORAL | Status: DC | PRN

## 2019-01-26 MED ORDER — ALUM & MAG HYDROXIDE-SIMETH 200-200-20 MG/5ML PO SUSP: 30.0000 mL | ORAL | Status: DC | PRN

## 2019-01-26 MED ORDER — BUPIVACAINE LIPOSOME 1.3 % IJ SUSP
INTRAMUSCULAR | Status: DC | PRN
  Administered 2019-01-26: 20 mL

## 2019-01-26 MED ORDER — BUPIVACAINE-EPINEPHRINE (PF) 0.25% -1:200000 IJ SOLN
INTRAMUSCULAR | Status: AC
  Filled 2019-01-26: qty 30

## 2019-01-26 MED ORDER — PROPOFOL 10 MG/ML IV BOLUS
INTRAVENOUS | Status: AC
  Filled 2019-01-26: qty 60

## 2019-01-26 MED ORDER — SODIUM CHLORIDE (PF) 0.9 % IJ SOLN
INTRAMUSCULAR | Status: AC
  Filled 2019-01-26: qty 50

## 2019-01-26 MED ORDER — PROPOFOL 500 MG/50ML IV EMUL
INTRAVENOUS | Status: DC | PRN
  Administered 2019-01-26: 25 ug/kg/min via INTRAVENOUS

## 2019-01-26 MED ORDER — HYDROMORPHONE HCL 1 MG/ML IJ SOLN: 0.5000 mg | INTRAMUSCULAR | Status: DC | PRN

## 2019-01-26 MED ORDER — FENTANYL CITRATE (PF) 100 MCG/2ML IJ SOLN
INTRAMUSCULAR | Status: AC
  Filled 2019-01-26: qty 2

## 2019-01-26 MED ORDER — FENTANYL CITRATE (PF) 100 MCG/2ML IJ SOLN: 25.0000 ug | INTRAMUSCULAR | Status: DC | PRN

## 2019-01-26 MED ORDER — DIPHENHYDRAMINE HCL 12.5 MG/5ML PO ELIX: 12.5000 mg | ORAL_SOLUTION | ORAL | Status: DC | PRN

## 2019-01-26 MED ORDER — MAGNESIUM OXIDE 400 (241.3 MG) MG PO TABS
200.0000 mg | ORAL_TABLET | Freq: Every day | ORAL | Status: DC
  Administered 2019-01-27: 200 mg via ORAL
  Filled 2019-01-26: qty 1

## 2019-01-26 MED ORDER — EPHEDRINE SULFATE-NACL 50-0.9 MG/10ML-% IV SOSY
PREFILLED_SYRINGE | INTRAVENOUS | Status: DC | PRN
  Administered 2019-01-26: 10 mg via INTRAVENOUS

## 2019-01-26 MED ORDER — ACETAMINOPHEN 500 MG PO TABS
1000.0000 mg | ORAL_TABLET | Freq: Four times a day (QID) | ORAL | Status: AC
  Administered 2019-01-26 – 2019-01-27 (×4): 1000 mg via ORAL
  Filled 2019-01-26 (×4): qty 2

## 2019-01-26 MED ORDER — DEXAMETHASONE SODIUM PHOSPHATE 10 MG/ML IJ SOLN: 8.0000 mg | Freq: Once | INTRAMUSCULAR | Status: DC

## 2019-01-26 MED ORDER — LIDOCAINE 2% (20 MG/ML) 5 ML SYRINGE
INTRAMUSCULAR | Status: AC
  Filled 2019-01-26: qty 5

## 2019-01-26 MED ORDER — WATER FOR IRRIGATION, STERILE IR SOLN
Status: DC | PRN
  Administered 2019-01-26: 2000 mL

## 2019-01-26 MED ORDER — 0.9 % SODIUM CHLORIDE (POUR BTL) OPTIME
TOPICAL | Status: DC | PRN
  Administered 2019-01-26: 1000 mL

## 2019-01-26 MED ORDER — POLYETHYLENE GLYCOL 3350 17 G PO PACK
17.0000 g | PACK | Freq: Two times a day (BID) | ORAL | Status: DC
  Administered 2019-01-26 – 2019-01-27 (×2): 17 g via ORAL
  Filled 2019-01-26 (×2): qty 1

## 2019-01-26 MED ORDER — ROPIVACAINE HCL 5 MG/ML IJ SOLN
INTRAMUSCULAR | Status: DC | PRN
  Administered 2019-01-26: 30 mL via PERINEURAL

## 2019-01-26 MED ORDER — POVIDONE-IODINE 10 % EX SWAB
2.0000 "application " | Freq: Once | CUTANEOUS | Status: AC
  Administered 2019-01-26: 2 via TOPICAL

## 2019-01-26 MED ORDER — PHENOL 1.4 % MT LIQD: 1.0000 | OROMUCOSAL | Status: DC | PRN

## 2019-01-26 MED ORDER — ALLOPURINOL 100 MG PO TABS
100.0000 mg | ORAL_TABLET | Freq: Every day | ORAL | Status: DC
  Administered 2019-01-27: 100 mg via ORAL
  Filled 2019-01-26: qty 1

## 2019-01-26 MED ORDER — METOCLOPRAMIDE HCL 5 MG PO TABS: 5.0000 mg | ORAL_TABLET | Freq: Three times a day (TID) | ORAL | Status: DC | PRN

## 2019-01-26 MED ORDER — LIDOCAINE 2% (20 MG/ML) 5 ML SYRINGE
INTRAMUSCULAR | Status: DC | PRN
  Administered 2019-01-26: 40 mg via INTRAVENOUS

## 2019-01-26 MED ORDER — MENTHOL 3 MG MT LOZG: 1.0000 | LOZENGE | OROMUCOSAL | Status: DC | PRN

## 2019-01-26 MED ORDER — ONDANSETRON HCL 4 MG PO TABS
4.0000 mg | ORAL_TABLET | Freq: Four times a day (QID) | ORAL | Status: DC | PRN
  Administered 2019-01-26: 4 mg via ORAL
  Filled 2019-01-26: qty 1

## 2019-01-26 MED ORDER — LACTATED RINGERS IV SOLN
INTRAVENOUS | Status: DC
  Administered 2019-01-26: 06:00:00 via INTRAVENOUS

## 2019-01-26 MED ORDER — ASPIRIN EC 325 MG PO TBEC
325.0000 mg | DELAYED_RELEASE_TABLET | Freq: Every day | ORAL | Status: DC
  Administered 2019-01-27: 325 mg via ORAL
  Filled 2019-01-26: qty 1

## 2019-01-26 MED ORDER — PHENYLEPHRINE 40 MCG/ML (10ML) SYRINGE FOR IV PUSH (FOR BLOOD PRESSURE SUPPORT)
PREFILLED_SYRINGE | INTRAVENOUS | Status: DC | PRN
  Administered 2019-01-26: 80 ug via INTRAVENOUS

## 2019-01-26 MED ORDER — ONDANSETRON HCL 4 MG/2ML IJ SOLN
INTRAMUSCULAR | Status: AC
  Filled 2019-01-26: qty 2

## 2019-01-26 MED ORDER — FENTANYL CITRATE (PF) 100 MCG/2ML IJ SOLN
INTRAMUSCULAR | Status: DC | PRN
  Administered 2019-01-26 (×2): 50 ug via INTRAVENOUS

## 2019-01-26 MED ORDER — METOCLOPRAMIDE HCL 5 MG/ML IJ SOLN: 5.0000 mg | Freq: Three times a day (TID) | INTRAMUSCULAR | Status: DC | PRN

## 2019-01-26 MED ORDER — POTASSIUM CHLORIDE IN NACL 20-0.9 MEQ/L-% IV SOLN
INTRAVENOUS | Status: DC
  Administered 2019-01-26 – 2019-01-27 (×2): via INTRAVENOUS
  Filled 2019-01-26 (×3): qty 1000

## 2019-01-26 MED ORDER — CEFAZOLIN SODIUM-DEXTROSE 2-4 GM/100ML-% IV SOLN
2.0000 g | Freq: Four times a day (QID) | INTRAVENOUS | Status: AC
  Administered 2019-01-26 (×2): 2 g via INTRAVENOUS
  Filled 2019-01-26 (×2): qty 100

## 2019-01-26 MED ORDER — POVIDONE-IODINE 7.5 % EX SOLN: Freq: Once | CUTANEOUS | Status: DC

## 2019-01-26 MED ORDER — DEXAMETHASONE SODIUM PHOSPHATE 10 MG/ML IJ SOLN
INTRAMUSCULAR | Status: AC
  Filled 2019-01-26: qty 1

## 2019-01-26 MED ORDER — ONDANSETRON HCL 4 MG/2ML IJ SOLN: 4.0000 mg | Freq: Once | INTRAMUSCULAR | Status: DC | PRN

## 2019-01-26 MED ORDER — ONDANSETRON HCL 4 MG/2ML IJ SOLN
INTRAMUSCULAR | Status: DC | PRN
  Administered 2019-01-26: 4 mg via INTRAVENOUS

## 2019-01-26 MED ORDER — DOCUSATE SODIUM 100 MG PO CAPS
100.0000 mg | ORAL_CAPSULE | Freq: Two times a day (BID) | ORAL | Status: DC
  Administered 2019-01-26 – 2019-01-27 (×2): 100 mg via ORAL
  Filled 2019-01-26 (×2): qty 1

## 2019-01-26 MED ORDER — EPHEDRINE 5 MG/ML INJ
INTRAVENOUS | Status: AC
  Filled 2019-01-26: qty 10

## 2019-01-26 MED ORDER — CEFAZOLIN SODIUM-DEXTROSE 2-4 GM/100ML-% IV SOLN
2.0000 g | INTRAVENOUS | Status: AC
  Administered 2019-01-26: 2 g via INTRAVENOUS
  Filled 2019-01-26: qty 100

## 2019-01-26 MED ORDER — GABAPENTIN 300 MG PO CAPS
300.0000 mg | ORAL_CAPSULE | Freq: Three times a day (TID) | ORAL | Status: DC
  Administered 2019-01-26 – 2019-01-27 (×3): 300 mg via ORAL
  Filled 2019-01-26 (×3): qty 1

## 2019-01-26 MED ORDER — MIDAZOLAM HCL 2 MG/2ML IJ SOLN
INTRAMUSCULAR | Status: AC
  Filled 2019-01-26: qty 2

## 2019-01-26 MED ORDER — SODIUM CHLORIDE 0.9 % IV BOLUS: 500.0000 mL | Freq: Once | INTRAVENOUS | Status: DC

## 2019-01-26 SURGICAL SUPPLY — 58 items
ATTUNE MED DOME PAT 32 KNEE (Knees) ×2 IMPLANT
ATTUNE PS FEM LT SZ 4 CEM KNEE (Femur) ×2 IMPLANT
ATTUNE PSRP INSR SZ4 8 KNEE (Insert) ×2 IMPLANT
BAG ZIPLOCK 12X15 (MISCELLANEOUS) ×2 IMPLANT
BASEPLATE TIBIAL ROTATING SZ 4 (Knees) ×2 IMPLANT
BLADE HEX COATED 2.75 (ELECTRODE) ×2 IMPLANT
BLADE SAGITTAL 25.0X1.19X90 (BLADE) ×2 IMPLANT
BLADE SAW SGTL 13X75X1.27 (BLADE) ×2 IMPLANT
BNDG ELASTIC 6X10 VLCR STRL LF (GAUZE/BANDAGES/DRESSINGS) ×2 IMPLANT
BOWL SMART MIX CTS (DISPOSABLE) ×2 IMPLANT
CEMENT HV SMART SET (Cement) ×4 IMPLANT
CHLORAPREP W/TINT 26 (MISCELLANEOUS) ×4 IMPLANT
CLSR STERI-STRIP ANTIMIC 1/2X4 (GAUZE/BANDAGES/DRESSINGS) ×2 IMPLANT
COVER SURGICAL LIGHT HANDLE (MISCELLANEOUS) ×2 IMPLANT
COVER WAND RF STERILE (DRAPES) IMPLANT
CUFF TOURN SGL QUICK 34 (TOURNIQUET CUFF) ×1
CUFF TRNQT CYL 34X4.125X (TOURNIQUET CUFF) ×1 IMPLANT
DECANTER SPIKE VIAL GLASS SM (MISCELLANEOUS) ×4 IMPLANT
DRAPE ORTHO SPLIT 77X108 STRL (DRAPES) ×1
DRAPE SHEET LG 3/4 BI-LAMINATE (DRAPES) ×2 IMPLANT
DRAPE SURG ORHT 6 SPLT 77X108 (DRAPES) ×1 IMPLANT
DRAPE U-SHAPE 47X51 STRL (DRAPES) ×2 IMPLANT
DRSG AQUACEL AG ADV 3.5X10 (GAUZE/BANDAGES/DRESSINGS) ×2 IMPLANT
ELECT REM PT RETURN 15FT ADLT (MISCELLANEOUS) ×2 IMPLANT
GLOVE BIO SURGEON STRL SZ7 (GLOVE) ×2 IMPLANT
GLOVE BIOGEL PI IND STRL 7.0 (GLOVE) ×1 IMPLANT
GLOVE BIOGEL PI IND STRL 7.5 (GLOVE) ×1 IMPLANT
GLOVE BIOGEL PI INDICATOR 7.0 (GLOVE) ×1
GLOVE BIOGEL PI INDICATOR 7.5 (GLOVE) ×1
GLOVE SS BIOGEL STRL SZ 7.5 (GLOVE) ×1 IMPLANT
GLOVE SUPERSENSE BIOGEL SZ 7.5 (GLOVE) ×1
GOWN STRL REUS W/ TWL LRG LVL3 (GOWN DISPOSABLE) ×2 IMPLANT
GOWN STRL REUS W/TWL LRG LVL3 (GOWN DISPOSABLE) ×2
HANDPIECE INTERPULSE COAX TIP (DISPOSABLE) ×1
HOLDER FOLEY CATH W/STRAP (MISCELLANEOUS) ×2 IMPLANT
HOOD PEEL AWAY FLYTE STAYCOOL (MISCELLANEOUS) ×6 IMPLANT
KIT TURNOVER KIT A (KITS) ×2 IMPLANT
MANIFOLD NEPTUNE II (INSTRUMENTS) ×2 IMPLANT
MARKER SKIN DUAL TIP RULER LAB (MISCELLANEOUS) ×2 IMPLANT
NEEDLE HYPO 22GX1.5 SAFETY (NEEDLE) ×2 IMPLANT
NS IRRIG 1000ML POUR BTL (IV SOLUTION) ×2 IMPLANT
PACK TOTAL KNEE CUSTOM (KITS) ×2 IMPLANT
PIN STEINMAN FIXATION KNEE (PIN) ×2 IMPLANT
PIN THREADED HEADED SIGMA (PIN) ×2 IMPLANT
PROTECTOR NERVE ULNAR (MISCELLANEOUS) ×2 IMPLANT
SET HNDPC FAN SPRY TIP SCT (DISPOSABLE) ×1 IMPLANT
STAPLER VISISTAT 35W (STAPLE) IMPLANT
STRIP CLOSURE SKIN 1/2X4 (GAUZE/BANDAGES/DRESSINGS) ×2 IMPLANT
SUT MNCRL AB 3-0 PS2 18 (SUTURE) ×2 IMPLANT
SUT VIC AB 0 CT1 36 (SUTURE) ×4 IMPLANT
SUT VIC AB 1 CT1 36 (SUTURE) ×2 IMPLANT
SUT VIC AB 2-0 CT1 27 (SUTURE) ×2
SUT VIC AB 2-0 CT1 TAPERPNT 27 (SUTURE) ×2 IMPLANT
SYR CONTROL 10ML LL (SYRINGE) ×4 IMPLANT
TRAY FOLEY MTR SLVR 14FR STAT (SET/KITS/TRAYS/PACK) ×2 IMPLANT
WATER STERILE IRR 1000ML POUR (IV SOLUTION) ×4 IMPLANT
YANKAUER SUCT BULB TIP 10FT TU (MISCELLANEOUS) ×2 IMPLANT
YANKAUER SUCT BULB TIP NO VENT (SUCTIONS) ×2 IMPLANT

## 2019-01-26 NOTE — Anesthesia Postprocedure Evaluation (Signed)
Anesthesia Post Note  Patient: Casey Taylor  Procedure(s) Performed: TOTAL KNEE ARTHROPLASTY (Left )     Patient location during evaluation: PACU Anesthesia Type: Spinal Level of consciousness: oriented and awake and alert Pain management: pain level controlled Vital Signs Assessment: post-procedure vital signs reviewed and stable Respiratory status: spontaneous breathing, respiratory function stable, patient connected to nasal cannula oxygen and nonlabored ventilation Cardiovascular status: blood pressure returned to baseline and stable Postop Assessment: no headache, no backache, no apparent nausea or vomiting, spinal receding and patient able to bend at knees Anesthetic complications: no    Last Vitals:  Vitals:   01/26/19 0933 01/26/19 0945  BP: (!) 112/55 117/67  Pulse: 72 69  Resp: 15 13  Temp: (!) 36.3 C   SpO2: 100% 100%    Last Pain:  Vitals:   01/26/19 0542  TempSrc: Oral                 Braylon Grenda A.

## 2019-01-26 NOTE — Interval H&P Note (Signed)
History and Physical Interval Note:  01/26/2019 5:36 AM  Casey Taylor  has presented today for surgery, with the diagnosis of DJD LEFT KNEE.  The various methods of treatment have been discussed with the patient and family. After consideration of risks, benefits and other options for treatment, the patient has consented to  Procedure(s): TOTAL KNEE ARTHROPLASTY (Left) as a surgical intervention.  The patient's history has been reviewed, patient examined, no change in status, stable for surgery.  I have reviewed the patient's chart and labs.  Questions were answered to the patient's satisfaction.     Lorn Junes

## 2019-01-26 NOTE — Op Note (Signed)
MRN:     [id number] DOB/AGE:    10/22/1935 / 83 y.o.       OPERATIVE REPORT   DATE OF PROCEDURE:  01/26/2019      PREOPERATIVE DIAGNOSIS:   Primary Localized Osteoarthritis left Knee       Estimated body mass index is 34.57 kg/m as calculated from the following:   Height as of this encounter: 5\' 2"  (1.575 m).   Weight as of this encounter: 85.7 kg.                                                       POSTOPERATIVE DIAGNOSIS:   Same                                                                 PROCEDURE:  Procedure(s): TOTAL KNEE ARTHROPLASTY Using Depuy Attune RP implants #4 Femur, #4Tibia, 61mm  RP bearing, 32 Patella    SURGEON: Solina Heron A. Noemi Chapel, MD   ASSISTANT: Matthew Saras, PA-C, present and scrubbed throughout the case, critical for retraction, instrumentation, and closure.  ANESTHESIA: Spinal with Adductor Nerve Block  TOURNIQUET TIME: 59 minutes   COMPLICATIONS:  None       SPECIMENS: None   INDICATIONS FOR PROCEDURE: The patient has djd of the knee with varus deformities, XR shows bone on bone arthritis. Patient has failed all conservative measures including anti-inflammatory medicines, narcotics, attempts at exercise and weight loss, cortisone injections and viscosupplementation.  Risks and benefits of surgery have been discussed, questions answered.    DESCRIPTION OF PROCEDURE: The patient identified by armband, received right adductor canal block and IV antibiotics, in the holding area at [facility]. Patient taken to the operating room, appropriate anesthetic monitors were attached. Spinal anesthesia induced with the patient in supine position, Foley catheter was inserted. Tourniquet applied high to the operative thigh. Lateral post and foot positioner applied to the table, the lower extremity was then prepped and draped in usual sterile fashion from the ankle to the tourniquet. Time-out procedure was performed. The limb was wrapped with an Esmarch bandage  and the tourniquet inflated to 365 mmHg.   We began the operation by making a 6cm anterior midline incision. Small bleeders in the skin and the subcutaneous tissue identified and cauterized. Transverse retinaculum was incised and reflected medially and a medial parapatellar arthrotomy was accomplished. the patella was everted and theprepatellar fat pad resected. The superficial medial collateral ligament was then elevated from anterior to posterior along the proximal flare of the tibia and anterior half of the menisci resected. The knee was hyperflexed exposing bone on bone arthritis. Peripheral and notch osteophytes as well as the cruciate ligaments were then resected. We continued to work our way around posteriorly along the proximal tibia, and externally rotated the tibia subluxing it out from underneath the femur. A McHale retractor was placed through the notch and a lateral Hohmann retractor placed, and an external tibial guide was placed.  The tibial cutting guide was pinned into place allowing resection of 4 mm of bone medially and about 6 mm of bone laterally because of her  varus deformity.   Satisfied with the tibial resection, we then entered the distal femur 2 mm anterior to the PCL origin with the intramedullary guide rod and applied the distal femoral cutting guide set at 58mm, with 5 degrees of valgus. This was pinned along the epicondylar axis. At this point, the distal femoral cut was accomplished without difficulty. We then sized for a 4 femoral component and pinned the guide in 3 degrees of external rotation.The chamfer cutting guide was pinned into place. The anterior, posterior, and chamfer cuts were accomplished without difficulty followed by the  RP box cutting guide and the box cut. We also removed posterior osteophytes from the posterior femoral condyles. At this time, the knee was brought into full extension. We checked our extension and flexion gaps and found them symmetric at 8.  The  patella thickness measured at 22m m. We set the cutting guide at 15 and removed the posterior patella sized for 32 button and drilled the lollipop. The knee was then once again hyperflexed exposing the proximal tibia. We sized for a # 4 tibial base plate, applied the smokestack and the conical reamer followed by the the Delta fin keel punch. We then hammered into place the  RP trial femoral component, inserted a trial bearing, trial patellar button, and took the knee through range of motion from 0-130 degrees. No thumb pressure was required for patellar tracking.   At this point, all trial components were removed, a double batch of DePuy HV cement  was mixed and applied to all bony metallic mating surfaces. In order, we hammered into place the tibial tray and removed excess cement, the femoral component and removed excess cement, a 8 mm  RP bearing was inserted, and the knee brought to full extension with compression. The patellar button was clamped into place, and excess cement removed. While the cement cured the wound was irrigated out with normal saline solution pulse lavage, and exparel was injected throughout the knee. Ligament stability and patellar tracking were checked and found to be excellent..   The parapatellar arthrotomy was closed with  #1 Vicryl suture. The subcutaneous tissue with 0 and 2-0 undyed Vicryl suture, and 4-0 Monocryl.. A dressing of Aquaseal, 4 x 4, dressing sponges, Webril, and Ace wrap applied. Needle and sponge count were correct times 2.The patient awakened, extubated, and taken to recovery room without difficulty. Vascular status was normal, pulses 2+ and symmetric.    Lorn Junes 09/09/2017, 8:56 AM

## 2019-01-26 NOTE — Evaluation (Signed)
Physical Therapy Evaluation Patient Details Name: Casey Taylor MRN: [id number] DOB: Feb 04, 1936 Today's Date: 01/26/2019   History of Present Illness  s/p L TKA. PMH: HTN, OA  Clinical Impression  Pt is s/p TKA resulting in the deficits listed below (see PT Problem List).  Amb 64' with min assist, anticipate steady  Progress in acute setting, likely will be ready for d/c in am Pt will benefit from skilled PT to increase their independence and safety with mobility to allow discharge to the venue listed below.      Follow Up Recommendations Follow surgeon's recommendation for DC plan and follow-up therapies    Equipment Recommendations  None recommended by PT    Recommendations for Other Services       Precautions / Restrictions Precautions Precautions: Fall;Knee Restrictions Weight Bearing Restrictions: No Other Position/Activity Restrictions: WBAT      Mobility  Bed Mobility Overal bed mobility: Needs Assistance Bed Mobility: Supine to Sit     Supine to sit: Min assist     General bed mobility comments: assist with LLE  Transfers Overall transfer level: Needs assistance Equipment used: Rolling walker (2 wheeled) Transfers: Sit to/from Stand Sit to Stand: Min guard;Min assist         General transfer comment: cues for hand placement and LLE position  Ambulation/Gait Ambulation/Gait assistance: Min assist;Min guard Gait Distance (Feet): 65 Feet Assistive device: Rolling walker (2 wheeled) Gait Pattern/deviations: Step-to pattern;Decreased weight shift to left     General Gait Details: cues for sequence and RW position  Stairs            Wheelchair Mobility    Modified Rankin (Stroke Patients Only)       Balance                                             Pertinent Vitals/Pain Pain Assessment: 0-10 Pain Score: 6  Pain Location: left knee Pain Descriptors / Indicators: Sore Pain Intervention(s): Limited activity  within patient's tolerance;Monitored during session;Repositioned    Home Living Family/patient expects to be discharged to:: Private residence Living Arrangements: Alone   Type of Home: House Home Access: Level entry(half step)     Home Layout: One level Home Equipment: Environmental consultant - 2 wheels;Bedside commode;Cane - single point      Prior Function Level of Independence: Independent;Independent with assistive device(s)         Comments: has cane if needed     Hand Dominance        Extremity/Trunk Assessment   Upper Extremity Assessment Upper Extremity Assessment: Overall WFL for tasks assessed    Lower Extremity Assessment Lower Extremity Assessment: LLE deficits/detail LLE Deficits / Details: ankle WFL; knee and hip grossly 3/5, AAROM  ~ 6 to 65 degrees flexion       Communication   Communication: No difficulties  Cognition Arousal/Alertness: Awake/alert Behavior During Therapy: WFL for tasks assessed/performed Overall Cognitive Status: Within Functional Limits for tasks assessed                                        General Comments      Exercises Total Joint Exercises Ankle Circles/Pumps: AROM;10 reps;Both Quad Sets: 10 reps;AROM;Both   Assessment/Plan    PT Assessment Patient needs continued PT services  PT Problem List Decreased strength;Decreased range of motion;Decreased activity tolerance;Decreased mobility;Pain;Decreased knowledge of use of DME       PT Treatment Interventions DME instruction;Gait training;Functional mobility training;Therapeutic activities;Therapeutic exercise;Patient/family education    PT Goals (Current goals can be found in the Care Plan section)  Acute Rehab PT Goals PT Goal Formulation: With patient Time For Goal Achievement: 02/02/19 Potential to Achieve Goals: Good    Frequency     Barriers to discharge        Co-evaluation               AM-PAC PT "6 Clicks" Mobility  Outcome Measure  Help needed turning from your back to your side while in a flat bed without using bedrails?: A Little Help needed moving from lying on your back to sitting on the side of a flat bed without using bedrails?: A Little   Help needed standing up from a chair using your arms (e.g., wheelchair or bedside chair)?: A Little Help needed to walk in hospital room?: A Little Help needed climbing 3-5 steps with a railing? : A Little 6 Click Score: 3    End of Session Equipment Utilized During Treatment: Gait belt Activity Tolerance: Patient tolerated treatment well Patient left: in chair;with call bell/phone within reach;with family/visitor present;with chair alarm set   PT Visit Diagnosis: Difficulty in walking, not elsewhere classified (R26.2)    Time: 1601-0932 PT Time Calculation (min) (ACUTE ONLY): 25 min   Charges:   PT Evaluation $PT Eval Low Complexity: 1 Low PT Treatments $Gait Training: 8-22 mins        Kenyon Ana, PT  Pager: [phone] Acute Rehab Dept [facility]): [phone]   01/26/2019   [facility] 01/26/2019, 3:38 PM

## 2019-01-26 NOTE — Anesthesia Procedure Notes (Signed)
Date/Time: 01/26/2019 7:24 AM Performed by: Talbot Grumbling, CRNA Oxygen Delivery Method: Simple face mask

## 2019-01-26 NOTE — Anesthesia Procedure Notes (Signed)
Spinal  Patient location during procedure: OR Start time: 01/26/2019 7:20 AM End time: 01/26/2019 7:23 AM Staffing Anesthesiologist: Josephine Igo, MD Resident/CRNA: Talbot Grumbling, CRNA Performed: resident/CRNA  Preanesthetic Checklist Completed: patient identified, site marked, surgical consent, pre-op evaluation, timeout performed, IV checked, risks and benefits discussed and monitors and equipment checked Spinal Block Patient position: sitting Prep: DuraPrep Patient monitoring: heart rate, cardiac monitor, continuous pulse ox and blood pressure Approach: midline Location: L3-4 Injection technique: single-shot Needle Needle type: Pencan  Needle gauge: 24 G Needle length: 9 cm Assessment Sensory level: T4 Additional Notes Clear CSF, no paresthesia, patient tolerated well.

## 2019-01-26 NOTE — Progress Notes (Signed)
Orthopedic Tech Progress Note Patient Details:  Casey Taylor February 28, 1936 [id number] Applied CPM and patient slightly grimaced at the 85 degree mark but said she can tolerate it.  CPM Left Knee CPM Left Knee: On Left Knee Flexion (Degrees): 90 Left Knee Extension (Degrees): 0  Post Interventions Patient Tolerated: Fair Instructions Provided: Adjustment of device, Care of device, Poper ambulation with device  [name] N Drue Camera 01/26/2019, 10:09 AM

## 2019-01-26 NOTE — Care Plan (Signed)
Ortho Bundle Case Management Note  Patient Details  Name: Casey Taylor MRN: [id number] Date of Birth: [birth year]   Met with patient and daughter in the office prior to surgery. She will discharge to home with her and HHPT. She has all needed equipment at home. HHPT referral to [facility]. Patient and MD agreeable to plan. Choice offered.                     DME Arranged:  CPM DME Agency:  [employer]  HH Arranged:  PT [place] Agency:  [facility] (formerly [facility])  Additional Comments: Please contact me with any questions of if this plan should need to change.  Ladell Heads,  [place] [facility]  [phone] 01/26/2019, 2:38 PM

## 2019-01-26 NOTE — Anesthesia Procedure Notes (Signed)
Anesthesia Regional Block: Adductor canal block   Pre-Anesthetic Checklist: ,, timeout performed, Correct Patient, Correct Site, Correct Laterality, Correct Procedure, Correct Position, site marked, Risks and benefits discussed,  Surgical consent,  Pre-op evaluation,  At surgeon's request and post-op pain management  Laterality: Left  Prep: chloraprep       Needles:  Injection technique: Single-shot  Needle Type: Echogenic Stimulator Needle     Needle Length: 9cm  Needle Gauge: 21   Needle insertion depth: 8 cm   Additional Needles:   Procedures:,,,, ultrasound used (permanent image in chart),,,,  Narrative:  Start time: 01/26/2019 6:55 AM End time: 01/26/2019 7:00 AM Injection made incrementally with aspirations every 5 mL.  Performed by: Personally  Anesthesiologist: Josephine Igo, MD  Additional Notes: Timeout performed. Patient sedated. Relevant anatomy ID'd using [place]. Incremental 2-58ml injection of LA with frequent aspiration. Patient tolerated procedure well.        Left Adductor Canal Block

## 2019-01-26 NOTE — Transfer of Care (Signed)
Immediate Anesthesia Transfer of Care Note  Patient: Casey Taylor  Procedure(s) Performed: TOTAL KNEE ARTHROPLASTY (Left )  Patient Location: PACU  Anesthesia Type:Spinal  Level of Consciousness: sedated  Airway & Oxygen Therapy: Patient Spontanous Breathing and Patient connected to face mask oxygen  Post-op Assessment: Report given to RN and Post -op Vital signs reviewed and stable  Post vital signs: Reviewed and stable  Last Vitals:  Vitals Value Taken Time  BP    Temp    Pulse    Resp    SpO2      Last Pain:  Vitals:   01/26/19 0542  TempSrc: Oral         Complications: No apparent anesthesia complications

## 2019-01-27 ENCOUNTER — Encounter (HOSPITAL_COMMUNITY): Payer: Self-pay | Admitting: Orthopedic Surgery

## 2019-01-27 DIAGNOSIS — M1712 Unilateral primary osteoarthritis, left knee: Secondary | ICD-10-CM | POA: Diagnosis not present

## 2019-01-27 LAB — CBC
HCT: 29.3 % — ABNORMAL LOW (ref 36.0–46.0)
Hemoglobin: 9.3 g/dL — ABNORMAL LOW (ref 12.0–15.0)
MCH: 30.4 pg (ref 26.0–34.0)
MCHC: 31.7 g/dL (ref 30.0–36.0)
MCV: 95.8 fL (ref 80.0–100.0)
Platelets: 278 10*3/uL (ref 150–400)
RBC: 3.06 MIL/uL — ABNORMAL LOW (ref 3.87–5.11)
RDW: 12.6 % (ref 11.5–15.5)
WBC: 11.8 10*3/uL — ABNORMAL HIGH (ref 4.0–10.5)
nRBC: 0 % (ref 0.0–0.2)

## 2019-01-27 LAB — URINE CULTURE: Culture: NO GROWTH

## 2019-01-27 LAB — BASIC METABOLIC PANEL
Anion gap: 10 (ref 5–15)
BUN: 18 mg/dL (ref 8–23)
CO2: 25 mmol/L (ref 22–32)
Calcium: 8.2 mg/dL — ABNORMAL LOW (ref 8.9–10.3)
Chloride: 96 mmol/L — ABNORMAL LOW (ref 98–111)
Creatinine, Ser: 1.05 mg/dL — ABNORMAL HIGH (ref 0.44–1.00)
GFR calc Af Amer: 58 mL/min — ABNORMAL LOW (ref >60)
GFR calc non Af Amer: 50 mL/min — ABNORMAL LOW (ref >60)
Glucose, Bld: 177 mg/dL — ABNORMAL HIGH (ref 70–99)
Potassium: 3.4 mmol/L — ABNORMAL LOW (ref 3.5–5.1)
Sodium: 131 mmol/L — ABNORMAL LOW (ref 135–145)

## 2019-01-27 MED ORDER — ONDANSETRON HCL 4 MG PO TABS: 4.0000 mg | ORAL_TABLET | Freq: Four times a day (QID) | ORAL | 0 refills | Status: DC | PRN

## 2019-01-27 MED ORDER — ASPIRIN 325 MG PO TBEC: DELAYED_RELEASE_TABLET | ORAL | 0 refills | Status: DC

## 2019-01-27 MED ORDER — DOCUSATE SODIUM 100 MG PO CAPS: ORAL_CAPSULE | ORAL | 0 refills | Status: DC

## 2019-01-27 MED ORDER — POLYETHYLENE GLYCOL 3350 17 G PO PACK: PACK | ORAL | 0 refills | Status: DC

## 2019-01-27 MED ORDER — OXYCODONE HCL 5 MG PO TABS: ORAL_TABLET | ORAL | 0 refills | Status: DC

## 2019-01-27 MED ORDER — SODIUM CHLORIDE 0.9 % IV BOLUS: 500.0000 mL | Freq: Once | INTRAVENOUS | Status: DC

## 2019-01-27 NOTE — Progress Notes (Signed)
Physical Therapy Treatment Patient Details Name: Casey Taylor MRN: [id number] DOB: 27-Jul-1935 Today's Date: 01/27/2019    History of Present Illness s/p L TKA. PMH: HTN, OA    PT Comments    Pt progressing well; ready for d/c from PT standpoint   Follow Up Recommendations  Follow surgeon's recommendation for DC plan and follow-up therapies     Equipment Recommendations  None recommended by PT    Recommendations for Other Services       Precautions / Restrictions Precautions Precautions: Fall;Knee Restrictions Weight Bearing Restrictions: No Other Position/Activity Restrictions: WBAT    Mobility  Bed Mobility Overal bed mobility: Needs Assistance Bed Mobility: Supine to Sit;Sit to Supine     Supine to sit: Supervision Sit to supine: Supervision   General bed mobility comments: instructed in use of gait belt as leg liter  Transfers Overall transfer level: Needs assistance Equipment used: Rolling walker (2 wheeled) Transfers: Sit to/from Stand Sit to Stand: Supervision         General transfer comment: cues for hand placement and LLE position  Ambulation/Gait Ambulation/Gait assistance: Supervision Gait Distance (Feet): 15 Feet(ambulated entire unit with PA ) Assistive device: Rolling walker (2 wheeled) Gait Pattern/deviations: Step-to pattern;Decreased weight shift to left     General Gait Details: cues for sequence and RW position   Stairs Stairs: (verbally reviewed up/down threshold)           Wheelchair Mobility    Modified Rankin (Stroke Patients Only)       Balance                                            Cognition Arousal/Alertness: Awake/alert Behavior During Therapy: WFL for tasks assessed/performed Overall Cognitive Status: Within Functional Limits for tasks assessed                                        Exercises Total Joint Exercises Ankle Circles/Pumps: AROM;10  reps;Both Quad Sets: 10 reps;AROM;Both Short Arc Quad: AROM;Left;10 reps Heel Slides: AROM;AAROM;Left;10 reps Hip ABduction/ADduction: AROM;AAROM;Left;10 reps Straight Leg Raises: AROM;AAROM;Strengthening;Left;10 reps    General Comments        Pertinent Vitals/Pain Pain Assessment: 0-10 Pain Score: 3  Pain Location: left knee Pain Descriptors / Indicators: Sore Pain Intervention(s): Limited activity within patient's tolerance;Monitored during session;Premedicated before session;Repositioned    Home Living                      Prior Function            PT Goals (current goals can now be found in the care plan section) Acute Rehab PT Goals PT Goal Formulation: With patient Time For Goal Achievement: 02/02/19 Potential to Achieve Goals: Good Progress towards PT goals: Progressing toward goals    Frequency    7X/week      PT Plan Current plan remains appropriate    Co-evaluation              AM-PAC PT "6 Clicks" Mobility   Outcome Measure  Help needed turning from your back to your side while in a flat bed without using bedrails?: A Little Help needed moving from lying on your back to sitting on the side of a flat bed without using bedrails?: None Help  needed moving to and from a bed to a chair (including a wheelchair)?: A Little Help needed standing up from a chair using your arms (e.g., wheelchair or bedside chair)?: A Little Help needed to walk in hospital room?: A Little Help needed climbing 3-5 steps with a railing? : A Little 6 Click Score: 19    End of Session Equipment Utilized During Treatment: Gait belt Activity Tolerance: Patient tolerated treatment well Patient left: in bed;with call bell/phone within reach;with family/visitor present   PT Visit Diagnosis: Difficulty in walking, not elsewhere classified (R26.2)     Time: 1027-2536 PT Time Calculation (min) (ACUTE ONLY): 27 min  Charges:  $Therapeutic Exercise: 8-22  mins $Therapeutic Activity: 8-22 mins                     Kenyon Ana, PT  Pager: [phone] Acute Rehab Dept [facility]): [phone]   01/27/2019    [facility] 01/27/2019, 11:21 AM

## 2019-01-27 NOTE — Plan of Care (Signed)
Plan of care reviewed and discussed with the patient. 

## 2019-01-27 NOTE — Discharge Summary (Signed)
Patient ID: Casey Taylor MRN: [id number] DOB/AGE: July 01, 1935 83 y.o.  Admit date: 01/26/2019 Discharge date: 01/27/2019  Admission Diagnoses:  Principal Problem:   Primary localized osteoarthritis of left knee Active Problems:   Breast cancer of upper-inner quadrant of right female breast [facility])   Essential hypertension   Anxiety   Stroke [facility])   Discharge Diagnoses:  Same  Past Medical History:  Diagnosis Date  . Anxiety   . Arthritis   . Atypical chest pain 11/12/2017  . Breast cancer ([place])   . Essential hypertension 11/12/2017  . Gout   . Hypertension    dr Karlton Lemon  . Primary localized osteoarthritis of left knee 01/26/2019  . Stroke [facility])    tia    06    Surgeries: Procedure(s): TOTAL KNEE ARTHROPLASTY on 01/26/2019   Consultants:   Discharged Condition: Improved  Hospital Course: Casey Taylor is an 83 y.o. female who was admitted 01/26/2019 for operative treatment ofPrimary localized osteoarthritis of left knee. Patient has severe unremitting pain that affects sleep, daily activities, and work/hobbies. After pre-op clearance the patient was taken to the operating room on 01/26/2019 and underwent  Procedure(s): TOTAL KNEE ARTHROPLASTY.    Patient was given perioperative antibiotics:  Anti-infectives (From admission, onward)   Start     Dose/Rate Route Frequency Ordered Stop   01/26/19 1400  ceFAZolin (ANCEF) IVPB 2g/100 mL premix     2 g 200 mL/hr over 30 Minutes Intravenous Every 6 hours 01/26/19 1113 01/26/19 2028   01/26/19 0600  ceFAZolin (ANCEF) IVPB 2g/100 mL premix     2 g 200 mL/hr over 30 Minutes Intravenous On call to O.R. 01/26/19 0533 01/26/19 0734       Patient was given sequential compression devices, early ambulation, and chemoprophylaxis to prevent DVT.  Patient benefited maximally from hospital stay and there were no complications.    Recent vital signs:  Patient Vitals for the past 24 hrs:  BP Temp Temp src Pulse Resp SpO2  Height Weight  01/27/19 0547 (!) 114/58 98.4 F (36.9 C) - 75 16 98 % - -  01/27/19 0120 104/66 98.2 F (36.8 C) Oral 72 14 97 % - -  01/26/19 2157 121/62 98.1 F (36.7 C) Oral 80 14 98 % - -  01/26/19 2000 - - - 76 16 - - -  01/26/19 1350 129/65 97.7 F (36.5 C) Oral 71 16 100 % - -  01/26/19 1310 - - - - - - 5\' 2"  (1.575 m) 85.3 kg  01/26/19 1244 127/65 (!) 97.5 F (36.4 C) Oral 72 16 100 % - -  01/26/19 1152 131/68 - - 68 16 100 % - -  01/26/19 1047 114/69 - - 68 18 100 % - -  01/26/19 1030 138/67 97.8 F (36.6 C) - 60 15 100 % - -  01/26/19 1015 - - - - 15 - - -  01/26/19 0945 117/67 - - 69 13 100 % - -  01/26/19 0933 (!) 112/55 (!) 97.3 F (36.3 C) - 72 15 100 % - -     Recent laboratory studies:  Recent Labs    01/27/19 0229  WBC 11.8*  HGB 9.3*  HCT 29.3*  PLT 278  NA 131*  K 3.4*  CL 96*  CO2 25  BUN 18  CREATININE 1.05*  GLUCOSE 177*  CALCIUM 8.2*     Discharge Medications:   Allergies as of 01/27/2019      Reactions   Codeine Nausea Only  Medication List    STOP taking these medications   aspirin 81 MG tablet Replaced by: aspirin 325 MG EC tablet   chlorthalidone 25 MG tablet Commonly known as: HYGROTON   losartan 25 MG tablet Commonly known as: Cozaar   meloxicam 15 MG tablet Commonly known as: MOBIC     TAKE these medications   allopurinol 100 MG tablet Commonly known as: ZYLOPRIM Take 1 tablet (100 mg total) by mouth daily. What changed:   when to take this  reasons to take this   ALPRAZolam 0.25 MG tablet Commonly known as: XANAX Take 0.25-0.5 mg by mouth at bedtime as needed for anxiety.   aspirin 325 MG EC tablet 1 tab a day for the next 30 days to prevent blood clots Replaces: aspirin 81 MG tablet   colchicine 0.6 MG tablet Take 1 tablet (0.6 mg total) by mouth daily. Two tablets stat, then once daily What changed:   when to take this  reasons to take this  additional instructions   docusate sodium 100 MG  capsule Commonly known as: COLACE 1 tab 2 times a day while on narcotics.  STOOL SOFTENER   Febuxostat 80 MG Tabs Take 40 mg by mouth daily.   gabapentin 300 MG capsule Commonly known as: NEURONTIN TAKE 1 CAPSULE BY MOUTH THREE TIMES DAILY . APPOINTMENT REQUIRED FOR FUTURE REFILLS What changed: See the new instructions.   Magnesium 250 MG Tabs Take 250 mg by mouth daily.   ondansetron 4 MG tablet Commonly known as: ZOFRAN Take 1 tablet (4 mg total) by mouth every 6 (six) hours as needed for nausea.   oxyCODONE 5 MG immediate release tablet Commonly known as: Oxy IR/ROXICODONE 1 tab po q 4 hrs prn pain   polyethylene glycol 17 g packet Commonly known as: MIRALAX / GLYCOLAX 17grams in 6 oz of something to drink twice a day until bowel movement.  LAXITIVE.  Restart if two days since last bowel movement   potassium gluconate 595 (99 K) MG Tabs tablet Take 595 mg by mouth daily.   VITAMIN D PO Take 2,000 Units by mouth daily.            Discharge Care Instructions  (From admission, onward)         Start     Ordered   01/27/19 0000  Change dressing    Comments: Change the gauze dressing daily with sterile 4 x 4 inch gauze and apply TED hose.  DO NOT REMOVE BANDAGE OVER SURGICAL INCISION.  [place] WHOLE LEG INCLUDING OVER THE WATERPROOF BANDAGE WITH SOAP AND WATER EVERY DAY.   01/27/19 0739          Diagnostic Studies: No results found.  Disposition: Discharge disposition: 01-Home or Self Care       Discharge Instructions    CPM   Complete by: As directed    Continuous passive motion machine (CPM):      Use the CPM from 0 to 90 for 6 hours per day.       You may break it up into 2 or 3 sessions per day.      Use CPM for 2 weeks or until you are told to stop.   Call MD / Call 911   Complete by: As directed    If you experience chest pain or shortness of breath, CALL 911 and be transported to the hospital emergency room.  If you develope a fever above 101 F,  pus (white drainage) or increased  drainage or redness at the wound, or calf pain, call your surgeon's office.   Change dressing   Complete by: As directed    Change the gauze dressing daily with sterile 4 x 4 inch gauze and apply TED hose.  DO NOT REMOVE BANDAGE OVER SURGICAL INCISION.  [place] WHOLE LEG INCLUDING OVER THE WATERPROOF BANDAGE WITH SOAP AND WATER EVERY DAY.   Constipation Prevention   Complete by: As directed    Drink plenty of fluids.  Prune juice may be helpful.  You may use a stool softener, such as Colace (over the counter) 100 mg twice a day.  Use MiraLax (over the counter) for constipation as needed.   Diet - low sodium heart healthy   Complete by: As directed    Discharge instructions   Complete by: As directed    INSTRUCTIONS AFTER JOINT REPLACEMENT   DO NOT TAKE HCTZ OR LOSARTAN UNTIL BLOOD PRESSURE IS HIGHER THAN 140/90.  INITIALLY AFTER SURGERY BLOOD PRESSURE IS LOWER FOR 1-2 WEEKS  EVERY HOUR DURING THE DAY GET UP AND WALK SOMEWHERE  Remove items at home which could result in a fall. This includes throw rugs or furniture in walking pathways ICE to the affected joint every three hours while awake for 30 minutes at a time, for at least the first 3-5 days, and then as needed for pain and swelling.  Continue to use ice for pain and swelling. You may notice swelling that will progress down to the foot and ankle.  This is normal after surgery.  Elevate your leg when you are not up walking on it.   Continue to use the breathing machine you got in the hospital (incentive spirometer) which will help keep your temperature down.  It is common for your temperature to cycle up and down following surgery, especially at night when you are not up moving around and exerting yourself.  The breathing machine keeps your lungs expanded and your temperature down.   DIET:  As you were doing prior to hospitalization, we recommend a well-balanced diet.  DRESSING / WOUND CARE /  SHOWERING  Keep the surgical dressing until follow up.  The dressing is water proof, so you can shower without any extra covering.  IF THE DRESSING FALLS OFF or the wound gets wet inside, change the dressing with sterile gauze.  Please use good hand washing techniques before changing the dressing.  Do not use any lotions or creams on the incision until instructed by your surgeon.    ACTIVITY  Increase activity slowly as tolerated, but follow the weight bearing instructions below.   No driving for 6 weeks or until further direction given by your physician.  You cannot drive while taking narcotics.  No lifting or carrying greater than 10 lbs. until further directed by your surgeon. Avoid periods of inactivity such as sitting longer than an hour when not asleep. This helps prevent blood clots.  You may return to work once you are authorized by your doctor.     WEIGHT BEARING   Weight bearing as tolerated with assist device (walker, cane, etc) as directed, use it as long as suggested by your surgeon or therapist, typically at least 2-3 weeks.   EXERCISES  Results after joint replacement surgery are often greatly improved when you follow the exercise, range of motion and muscle strengthening exercises prescribed by your doctor. Safety measures are also important to protect the joint from further injury. Any time any of these exercises cause you to  have increased pain or swelling, decrease what you are doing until you are comfortable again and then slowly increase them. If you have problems or questions, call your caregiver or physical therapist for advice.   Rehabilitation is important following a joint replacement. After just a few days of immobilization, the muscles of the leg can become weakened and shrink (atrophy).  These exercises are designed to build up the tone and strength of the thigh and leg muscles and to improve motion. Often times heat used for twenty to thirty minutes before working  out will loosen up your tissues and help with improving the range of motion but do not use heat for the first two weeks following surgery (sometimes heat can increase post-operative swelling).   These exercises can be done on a training (exercise) mat, on the floor, on a table or on a bed. Use whatever works the best and is most comfortable for you.    Use music or television while you are exercising so that the exercises are a pleasant break in your day. This will make your life better with the exercises acting as a break in your routine that you can look forward to.   Perform all exercises about fifteen times, three times per day or as directed.  You should exercise both the operative leg and the other leg as well.   Exercises include:  Quad Sets - Tighten up the muscle on the front of the thigh (Quad) and hold for 5-10 seconds.   Straight Leg Raises - With your knee straight (if you were given a brace, keep it on), lift the leg to 60 degrees, hold for 3 seconds, and slowly lower the leg.  Perform this exercise against resistance later as your leg gets stronger.  Leg Slides: Lying on your back, slowly slide your foot toward your buttocks, bending your knee up off the floor (only go as far as is comfortable). Then slowly slide your foot back down until your leg is flat on the floor again.  Angel Wings: Lying on your back spread your legs to the side as far apart as you can without causing discomfort.  Hamstring Strength:  Lying on your back, push your heel against the floor with your leg straight by tightening up the muscles of your buttocks.  Repeat, but this time bend your knee to a comfortable angle, and push your heel against the floor.  You may put a pillow under the heel to make it more comfortable if necessary.   A rehabilitation program following joint replacement surgery can speed recovery and prevent re-injury in the future due to weakened muscles. Contact your doctor or a physical therapist  for more information on knee rehabilitation.    CONSTIPATION  Constipation is defined medically as fewer than three stools per week and severe constipation as less than one stool per week.  Even if you have a regular bowel pattern at home, your normal regimen is likely to be disrupted due to multiple reasons following surgery.  Combination of anesthesia, postoperative narcotics, change in appetite and fluid intake all can affect your bowels.   YOU MUST use at least one of the following options; they are listed in order of increasing strength to get the job done.  They are all available over the counter, and you may need to use some, POSSIBLY even all of these options:    Drink plenty of fluids (prune juice may be helpful) and high fiber foods Colace 100 mg by  mouth twice a day  Senokot for constipation as directed and as needed Dulcolax (bisacodyl), take with full glass of water  Miralax (polyethylene glycol) once or twice a day as needed.  If you have tried all these things and are unable to have a bowel movement in the first 3-4 days after surgery call either your surgeon or your primary doctor.    If you experience loose stools or diarrhea, hold the medications until you stool forms back up.  If your symptoms do not get better within 1 week or if they get worse, check with your doctor.  If you experience "the worst abdominal pain ever" or develop nausea or vomiting, please contact the office immediately for further recommendations for treatment.   ITCHING:  If you experience itching with your medications, try taking only a single pain pill, or even half a pain pill at a time.  You can also use Benadryl over the counter for itching or also to help with sleep.   TED HOSE STOCKINGS:  Use stockings on both legs until for at least 2 weeks or as directed by physician office. They may be removed at night for sleeping.  MEDICATIONS:  See your medication summary on the "After Visit Summary" that  nursing will review with you.  You may have some home medications which will be placed on hold until you complete the course of blood thinner medication.  It is important for you to complete the blood thinner medication as prescribed.  PRECAUTIONS:  If you experience chest pain or shortness of breath - call 911 immediately for transfer to the hospital emergency department.   If you develop a fever greater that 101 F, purulent drainage from wound, increased redness or drainage from wound, foul odor from the wound/dressing, or calf pain - CONTACT YOUR SURGEON.                                                   FOLLOW-UP APPOINTMENTS:  If you do not already have a post-op appointment, please call the office for an appointment to be seen by your surgeon.  Guidelines for how soon to be seen are listed in your "After Visit Summary", but are typically between 1-4 weeks after surgery.  OTHER INSTRUCTIONS:   Knee Replacement:  Do not place pillow under knee, focus on keeping the knee straight while resting. CPM instructions: 0-90 degrees, 2 hours in the morning, 2 hours in the afternoon, and 2 hours in the evening. Place foam block, curve side up under heel at all times except when in CPM or when walking.  DO NOT modify, tear, cut, or change the foam block in any way.  MAKE SURE YOU:  Understand these instructions.  Get help right away if you are not doing well or get worse.    Thank you for letting us be a part of your medical care team.  It is a privilege we respect greatly.  We hope these instructions will help you stay on track for a fast and full recovery!   Do not put a pillow under the knee. Place it under the heel.   Complete by: As directed    Place gray foam block, curve side up under heel at all times except when in CPM or when walking.  DO NOT modify, tear, cut, or change in any  way the gray foam block.   Increase activity slowly as tolerated   Complete by: As directed    Patient may shower    Complete by: As directed    Aquacel dressing is water proof    Wash over it and the whole leg with soap and water at the end of your shower   TED hose   Complete by: As directed    Use stockings (TED hose) for 2 weeks on both leg(s).  You may remove them at night for sleeping.      Follow-up Information    Elsie Saas, MD. Go on 02/09/2019.   Specialty: Orthopedic Surgery Why: Your appointment has been scheduled for 3:00  Contact information: [address] [place] [address] [phone]        [facility] Follow up.   Specialty: [place] Why: You will be seen at home by a HHPT for 5 visits prior to starting outpatient physical therapy  Contact information: [address]  [place] [address] [phone]        [facility], [place]. Go on 02/02/2019.   Why: You are scheduled to start outpatient physical therapy at 2:00. Please arrive at 1:45 to complete your paperwork.  Contact information: [facility] [place] [address] [phone]            Signed: Linda Hedges 01/27/2019, 7:39 AM

## 2019-04-01 ENCOUNTER — Other Ambulatory Visit: Payer: Self-pay | Admitting: Internal Medicine

## 2019-04-01 DIAGNOSIS — Z1231 Encounter for screening mammogram for malignant neoplasm of breast: Secondary | ICD-10-CM

## 2019-05-20 ENCOUNTER — Other Ambulatory Visit: Payer: Self-pay

## 2019-05-20 ENCOUNTER — Ambulatory Visit
Admission: RE | Admit: 2019-05-20 | Discharge: 2019-05-20 | Disposition: A | Discharge status: Home / Self Care | Payer: Medicare Other | Source: Ambulatory Visit | Attending: Internal Medicine | Admitting: Internal Medicine

## 2019-05-20 ENCOUNTER — Other Ambulatory Visit: Payer: Self-pay | Admitting: Family

## 2019-05-20 DIAGNOSIS — Z1231 Encounter for screening mammogram for malignant neoplasm of breast: Secondary | ICD-10-CM

## 2020-02-10 ENCOUNTER — Other Ambulatory Visit: Payer: Self-pay | Admitting: Hematology and Oncology

## 2020-02-25 ENCOUNTER — Ambulatory Visit: Payer: Medicare Other | Admitting: Neurology

## 2020-02-28 NOTE — Progress Notes (Signed)
[facility]    Provider:  Dr Jaynee Eagles Requesting Provider: Suella Broad Primary Care Provider:  Suella Broad, FNP  CC:  Memory impairment  HPI:  Casey Taylor is a 84 y.o. female here as requested by Suella Broad,* for memory loss. PMHx HT, OA, gout, anxiety, breast cancer stage 0 DCIS.  I reviewed Casey Taylor's notes, patient with increased forgetfulness, family becoming concerned about her memory impairment particularly short-term memory, concern is for dementia versus age-related changes;otherwise  no significant history of memory loss documented on notes provided by requesting provider or labs provided.  I did review epic notes, she was discharged from [facility] January 27, 2019 after operative treatment of primary localized Casey Taylor arthritis of left knee with severe unremitting pain and had a total knee arthroplasty.  Patient appeared to do well without any problems and was discharged the next day.  I do not see any brain imaging ordered in the past.  I also reviewed "care everywhere" and found no prior labs, documents or imaging.She went to stay with her son and she was supposed to stay a month and she was upset like they left her there and for the family they think this is unusual. They were having a conversation at the table and she will keep asking the same questions. Her granddaughter and similar, Multiple family members have noticed.   She is here with her daughter who provides most information. Patient doesn't feel like she is having any memory concerns. She feels it is covid, she can't go out, she used to love to be social, and when covid came it "knocked you back", she can;t see her friends, she feels even young people are depressed. She was extremely social and going to conventions. Daughter has told her mother she can't live in fear and she understands but she can't live in constant fear. She has been vaccinated. She  works puzzles. She looks at magazines and word searches. Also sudukos and puzzles. She is doing things. She is getting older. Her mother had dementia. Lately they will tell her something and 5 minutes later she will ask again. Periods of forgetfullness. She moved from [place] and couldn't remember about her car and the things they were supposed to do with her car. They are alarmed by the memory loss, more short term. Mother had dementia.   Reviewed notes, labs and imaging from outside physicians, which showed: see above  Review of Systems: Patient complains of symptoms per HPI as well as the following symptoms: memory loss. Pertinent negatives and positives per HPI. All others negative.   Social History   Socioeconomic History  . Marital status: Divorced    Spouse name: Not on file  . Number of children: 5  . Years of education: Not on file  . Highest education level: Some college, no degree  Occupational History  . Not on file  Tobacco Use  . Smoking status: Never Smoker  . Smokeless tobacco: Never Used  Vaping Use  . Vaping Use: Never used  Substance and Sexual Activity  . Alcohol use: Yes    Alcohol/week: 1.0 standard drink    Types: 1 Glasses of wine per week    Comment: weekly  . Drug use: No  . Sexual activity: Not on file  Other Topics Concern  . Not on file  Social History Narrative   Lives at home alone    Caffeine: coffee maybe 1 cup daily   Right handed  Social Determinants of Health   Financial Resource Strain:   . Difficulty of Paying Living Expenses: Not on file  Food Insecurity:   . Worried About Charity fundraiser in the Last Year: Not on file  . Ran Out of Food in the Last Year: Not on file  Transportation Needs:   . Lack of Transportation (Medical): Not on file  . Lack of Transportation (Non-Medical): Not on file  Physical Activity:   . Days of Exercise per Week: Not on file  . Minutes of Exercise per Session: Not on file  Stress:   . Feeling of  Stress : Not on file  Social Connections:   . Frequency of Communication with Friends and Family: Not on file  . Frequency of Social Gatherings with Friends and Family: Not on file  . Attends Religious Services: Not on file  . Active Member of Clubs or Organizations: Not on file  . Attends Archivist Meetings: Not on file  . Marital Status: Not on file  Intimate Partner Violence:   . Fear of Current or Ex-Partner: Not on file  . Emotionally Abused: Not on file  . Physically Abused: Not on file  . Sexually Abused: Not on file    Family History  Problem Relation Age of Onset  . Heart attack Mother   . Diabetes Mother   . Dementia Mother   . Heart attack Father   . Hypertension Sister   . Diabetes Sister   . Diabetes Brother   . Kidney disease Brother   . Esophageal cancer Brother   . Lung cancer Brother   . Prostate cancer Brother   . Diabetes Brother     Past Medical History:  Diagnosis Date  . Anxiety   . Arthritis   . Atypical chest pain 11/12/2017  . Breast cancer ([place])   . Essential hypertension 11/12/2017  . Gout   . Hypertension    dr Karlton Lemon  . Primary localized osteoarthritis of left knee 01/26/2019  . Stroke [facility])    tia    06    Patient Active Problem List   Diagnosis Date Noted  . Short-term memory loss 02/29/2020  . FHx: dementia 02/29/2020  . Primary localized osteoarthritis of left knee 01/26/2019  . Anxiety   . Stroke ([place])   . Essential hypertension 11/12/2017  . Atypical chest pain 11/12/2017  . Gout 08/07/2013  . Bunion 08/07/2013  . Osteoarthritis (arthritis due to wear and tear of joints) 08/07/2013  . Breast cancer of upper-inner quadrant of right female breast ([place]) 04/16/2013    Past Surgical History:  Procedure Laterality Date  . ABDOMINAL HYSTERECTOMY    . BREAST BIOPSY Left 05/26/2013   Procedure: BREAST BIOPSY WITH NEEDLE LOCALIZATION;  Surgeon: Stark Klein, MD;  Location: [facility];  Service: General;  Laterality: Left;   . BREAST LUMPECTOMY Right 2014  . CHOLECYSTECTOMY    . PARTIAL MASTECTOMY WITH NEEDLE LOCALIZATION Right 05/26/2013   Procedure: PARTIAL MASTECTOMY WITH NEEDLE LOCALIZATION;  Surgeon: Stark Klein, MD;  Location: [facility];  Service: General;  Laterality: Right;  . TOTAL KNEE ARTHROPLASTY Left 01/26/2019   Procedure: TOTAL KNEE ARTHROPLASTY;  Surgeon: Elsie Saas, MD;  Location: [facility];  Service: Orthopedics;  Laterality: Left;    Current Outpatient Medications  Medication Sig Dispense Refill  . allopurinol (ZYLOPRIM) 100 MG tablet Take 1 tablet (100 mg total) by mouth daily. 30 tablet 6  . chlorthalidone (HYGROTON) 25 MG tablet Take 25 mg by mouth  daily. take with lots of water.    . Cholecalciferol (VITAMIN D PO) Take 2,000 Units by mouth daily.     . colchicine 0.6 MG tablet Take 1 tablet (0.6 mg total) by mouth daily. Two tablets stat, then once daily (Patient taking differently: Take 0.6 mg by mouth daily as needed (gout). ) 30 tablet 0  . gabapentin (NEURONTIN) 300 MG capsule Take 1 capsule (300 mg total) by mouth 3 (three) times daily. 90 capsule 1  . losartan (COZAAR) 25 MG tablet Take 25 mg by mouth daily.    . Magnesium 250 MG TABS Take 250 mg by mouth.     . meloxicam (MOBIC) 15 MG tablet Take 15 mg by mouth daily.    . potassium gluconate 595 MG TABS tablet Take 595 mg by mouth daily.     No current facility-administered medications for this visit.    Allergies as of 02/29/2020 - Review Complete 02/29/2020  Allergen Reaction Noted  . Codeine Nausea Only 02/15/2012    Vitals: BP 119/71 (BP Location: Right Arm, Patient Position: Sitting)   Pulse 67   Ht 5\' 2"  (1.575 m)   Wt 189 lb (85.7 kg)   BMI 34.57 kg/m  Last Weight:  Wt Readings from Last 1 Encounters:  02/29/20 189 lb (85.7 kg)   Last Height:   Ht Readings from Last 1 Encounters:  02/29/20 5\' 2"  (1.575 m)     Physical exam: Exam: Gen: NAD, conversant, well nourised, obese, well groomed                      CV: RRR, no MRG. No Carotid Bruits. No peripheral edema, warm, nontender Eyes: Conjunctivae clear without exudates or hemorrhage  Neuro: Detailed Neurologic Exam  Speech:    Speech is normal; fluent and spontaneous with normal comprehension.  Cognition:    MMSE - Mini Mental State Exam 02/29/2020  Orientation to time 5  Orientation to Place 4  Registration 3  Attention/ Calculation 0  Recall 3  Language- name 2 objects 2  Language- repeat 1  Language- follow 3 step command 3  Language- read & follow direction 1  Write a sentence 1  Copy design 0  Total score 23    Cranial Nerves:    The pupils are equal, round, and reactive to light. Attempted fundoscopy could not visualize due to small pupils. Visual fields are full to finger confrontation. Extraocular movements are intact. Trigeminal sensation is intact and the muscles of mastication are normal. The face is symmetric. The palate elevates in the midline. Hearing intact. Voice is normal. Shoulder shrug is normal. The tongue has normal motion without fasciculations.   Coordination:    No dysmetria or ataxia  Gait:    not ataxic, not shuffling, adequate stride and arm swing for age but some low clearance and slight bradykinesia   Motor Observation:    No asymmetry, no atrophy, and no involuntary movements noted. Tone:    Normal muscle tone.    Posture:    Posture is normal. normal erect    Strength:    Strength is V/V in the upper and lower limbs.      Sensation: intact to LT     Reflex Exam:  DTR's:    Deep tendon reflexes in the upper and lower extremities are symmetrical bilaterally.   Toes:    The toes are downgoing bilaterally.   Clonus:    Clonus is absent.    Assessment/Plan:  84 y.o. female here as requested by Suella Broad,* for memory loss. PMHx HT, OA, gout, anxiety, breast cancer stage 0 DCIS.  I reviewed Casey Taylor's notes, patient with increased forgetfulness, family becoming  concerned about her memory impairment particularly short-term memory, concern is for dementia versus age-related changes;  MMSE 23 out of 30, patient is here with daughter who provides most information, symptoms are concerning for mild cognitive impairment possibly early neurodegenerative disorder such as Alzheimer's. Patient does have a history of dementia in her family. We'll do a thorough evaluation including MRI of the brain, blood work, formal neuro cognitive testing, and FDG PET scan to differentiate between her types of dementia including frontotemporal and Lewy body due to some other symptoms that may be more lewy-body such as some Changes in thinking and reasoning as well as some loss of motivation, some mild gait issues.   MRI of the brain FDG PET Scan Neuropsych testing labs  Orders Placed This Encounter  Procedures  . MR BRAIN W WO CONTRAST  . NM PET Metabolic Brain  . B12 and Folate Panel  . Methylmalonic acid, serum  . CBC with Differential/Platelets  . Comprehensive metabolic panel  . Homocysteine  . Vitamin B1  . TSH  . Vitamin D, 25-hydroxy  . Ambulatory referral to Neuropsychology   No orders of the defined types were placed in this encounter.   Cc: Elliot Dally, FNP  Sarina Ill, MD  [facility] [address] [place], [place] [address]  Phone [phone] Fax [phone]  I spent over 60 minutes of face-to-face and non-face-to-face time with patient on the  1. Memory loss   2. Vitamin D deficiency   3. FHx: dementia   4. Short-term memory loss   5. Alzheimer's disease, unspecified (CODE) ([place])   6. Dementia without behavioral disturbance, unspecified dementia type ([place])    diagnosis.  This included previsit chart review, lab review, study review, order entry, electronic health record documentation, patient education on the different diagnostic and therapeutic options, counseling and  coordination of care, risks and benefits of management, compliance, or risk factor reduction

## 2020-02-29 ENCOUNTER — Other Ambulatory Visit: Payer: Self-pay

## 2020-02-29 ENCOUNTER — Telehealth: Payer: Self-pay | Admitting: Neurology

## 2020-02-29 ENCOUNTER — Encounter: Payer: Self-pay | Admitting: Neurology

## 2020-02-29 ENCOUNTER — Ambulatory Visit (INDEPENDENT_AMBULATORY_CARE_PROVIDER_SITE_OTHER): Payer: Medicare Other | Admitting: Neurology

## 2020-02-29 VITALS — BP 119/71 | HR 67 | Ht 62.0 in | Wt 189.0 lb

## 2020-02-29 DIAGNOSIS — Z818 Family history of other mental and behavioral disorders: Secondary | ICD-10-CM

## 2020-02-29 DIAGNOSIS — G309 Alzheimer's disease, unspecified: Secondary | ICD-10-CM | POA: Diagnosis not present

## 2020-02-29 DIAGNOSIS — F0390 Unspecified dementia without behavioral disturbance: Secondary | ICD-10-CM

## 2020-02-29 DIAGNOSIS — E559 Vitamin D deficiency, unspecified: Secondary | ICD-10-CM

## 2020-02-29 DIAGNOSIS — R413 Other amnesia: Secondary | ICD-10-CM

## 2020-02-29 DIAGNOSIS — G3184 Mild cognitive impairment, so stated: Secondary | ICD-10-CM | POA: Insufficient documentation

## 2020-02-29 NOTE — Telephone Encounter (Signed)
Medicare/bcbs fed order sent to [facility]. No auth they will reach out to the patient to schedule.  °

## 2020-02-29 NOTE — Patient Instructions (Signed)
Blood work MRI of the brain Formal Memory Testing (Dr. Sima Matas) FDG PET Scan  Memory Compensation Strategies  1. Use "WARM" strategy.  W= write it down  A= associate it  R= repeat it  M= make a mental note  2.   You can keep a Social worker.  Use a 3-ring notebook with sections for the following: calendar, important names and phone numbers,  medications, doctors' names/phone numbers, lists/reminders, and a section to journal what you did  each day.   3.    Use a calendar to write appointments down.  4.    Write yourself a schedule for the day.  This can be placed on the calendar or in a separate section of the Memory Notebook.  Keeping a  regular schedule can help memory.  5.    Use medication organizer with sections for each day or morning/evening pills.  You may need help loading it  6.    Keep a basket, or pegboard by the door.  Place items that you need to take out with you in the basket or on the pegboard.  You may also want to  include a message board for reminders.  7.    Use sticky notes.  Place sticky notes with reminders in a place where the task is performed.  For example: " turn off the  stove" placed by the stove, "lock the door" placed on the door at eye level, " take your medications" on  the bathroom mirror or by the place where you normally take your medications.  8.    Use alarms/timers.  Use while cooking to remind yourself to check on food or as a reminder to take your medicine, or as a  reminder to make a call, or as a reminder to perform another task, etc.

## 2020-03-01 ENCOUNTER — Other Ambulatory Visit: Payer: Self-pay

## 2020-03-01 ENCOUNTER — Ambulatory Visit
Admission: RE | Admit: 2020-03-01 | Discharge: 2020-03-01 | Disposition: A | Discharge status: Home / Self Care | Payer: Medicare Other | Source: Ambulatory Visit | Attending: Neurology | Admitting: Neurology

## 2020-03-01 DIAGNOSIS — R413 Other amnesia: Secondary | ICD-10-CM | POA: Diagnosis not present

## 2020-03-01 DIAGNOSIS — E559 Vitamin D deficiency, unspecified: Secondary | ICD-10-CM

## 2020-03-01 MED ORDER — GADOBENATE DIMEGLUMINE 529 MG/ML IV SOLN
17.0000 mL | Freq: Once | INTRAVENOUS | Status: AC | PRN
  Administered 2020-03-01: 17 mL via INTRAVENOUS

## 2020-03-03 ENCOUNTER — Encounter: Payer: Self-pay | Admitting: Psychology

## 2020-03-05 ENCOUNTER — Telehealth: Payer: Self-pay | Admitting: Neurology

## 2020-03-05 NOTE — Telephone Encounter (Signed)
MRI of the brain did not show a dementing process, however this does not rule out dementia. It did show a small spot of unclear etiology, this spot may be benign and likely is as we all have some white matter changes as we age but I would like to check her blood vessels with a CTA of the head and neck and also repeat MRI of the brain in 3 months if they agree. It is very important to continue with the FDG PET Scan and formal memory testing. If they are ok with it I will order imaging of the blood vessels of the brain and will order a repeat MRi brain in 3-4 months please let me know see phone message thanks  FINDINGS:     No abnormal lesions are seen on diffusion-weighted views to suggest acute ischemia. The cortical sulci, fissures and cisterns are normal in size and appearance. Lateral, third and fourth ventricle are normal in size and appearance. No extra-axial fluid collections are seen. No evidence of mass effect or midline shift.   Small (15mm) right frontal juxtacortical T2 flair hyperintense lesion with subtle enhancement noted.   No abnormal lesions are seen on post contrast views.    On sagittal views the posterior fossa, pituitary gland and corpus callosum are unremarkable. No evidence of intracranial hemorrhage on SWI views. The orbits and their contents, paranasal sinuses and calvarium are unremarkable. Right globe lens extraction noted. Intracranial flow voids are present.   IMPRESSION:   MRI brain (with and without) demonstrating: - Small (27mm) right frontal juxtacortical T2 flair hyperintense lesion with subtle enhancement noted. No mass effect or parenchymal edema. Considerations include subacute autoimmune, inflammatory, vascular or post-infectious etiologies.

## 2020-03-06 ENCOUNTER — Other Ambulatory Visit: Payer: Self-pay | Admitting: Podiatry

## 2020-03-06 NOTE — Telephone Encounter (Signed)
Please advise 

## 2020-03-07 ENCOUNTER — Other Ambulatory Visit: Payer: Self-pay

## 2020-03-07 ENCOUNTER — Ambulatory Visit (INDEPENDENT_AMBULATORY_CARE_PROVIDER_SITE_OTHER): Payer: Medicare Other | Admitting: Podiatry

## 2020-03-07 DIAGNOSIS — M109 Gout, unspecified: Secondary | ICD-10-CM | POA: Diagnosis not present

## 2020-03-07 DIAGNOSIS — M7752 Other enthesopathy of left foot: Secondary | ICD-10-CM

## 2020-03-07 LAB — COMPREHENSIVE METABOLIC PANEL
ALT: 18 IU/L (ref 0–32)
AST: 22 IU/L (ref 0–40)
Albumin/Globulin Ratio: 1.8 (ref 1.2–2.2)
Albumin: 4.7 g/dL — ABNORMAL HIGH (ref 3.6–4.6)
Alkaline Phosphatase: 89 IU/L (ref 44–121)
BUN/Creatinine Ratio: 20 (ref 12–28)
BUN: 21 mg/dL (ref 8–27)
Bilirubin Total: 0.4 mg/dL (ref 0.0–1.2)
CO2: 27 mmol/L (ref 20–29)
Calcium: 10 mg/dL (ref 8.7–10.3)
Chloride: 101 mmol/L (ref 96–106)
Creatinine, Ser: 1.05 mg/dL — ABNORMAL HIGH (ref 0.57–1.00)
GFR calc Af Amer: 58 mL/min/{1.73_m2} — ABNORMAL LOW (ref >59)
GFR calc non Af Amer: 50 mL/min/{1.73_m2} — ABNORMAL LOW (ref >59)
Globulin, Total: 2.6 g/dL (ref 1.5–4.5)
Glucose: 95 mg/dL (ref 65–99)
Potassium: 4.8 mmol/L (ref 3.5–5.2)
Sodium: 141 mmol/L (ref 134–144)
Total Protein: 7.3 g/dL (ref 6.0–8.5)

## 2020-03-07 LAB — VITAMIN D 25 HYDROXY (VIT D DEFICIENCY, FRACTURES): Vit D, 25-Hydroxy: 30.8 ng/mL (ref 30.0–100.0)

## 2020-03-07 LAB — METHYLMALONIC ACID, SERUM: Methylmalonic Acid: 218 nmol/L (ref 0–378)

## 2020-03-07 LAB — CBC WITH DIFFERENTIAL/PLATELET
Basophils Absolute: 0.1 10*3/uL (ref 0.0–0.2)
Basos: 1 %
EOS (ABSOLUTE): 0.1 10*3/uL (ref 0.0–0.4)
Eos: 2 %
Hematocrit: 37.8 % (ref 34.0–46.6)
Hemoglobin: 12.5 g/dL (ref 11.1–15.9)
Immature Grans (Abs): 0 10*3/uL (ref 0.0–0.1)
Immature Granulocytes: 0 %
Lymphocytes Absolute: 2.2 10*3/uL (ref 0.7–3.1)
Lymphs: 36 %
MCH: 30.2 pg (ref 26.6–33.0)
MCHC: 33.1 g/dL (ref 31.5–35.7)
MCV: 91 fL (ref 79–97)
Monocytes Absolute: 0.5 10*3/uL (ref 0.1–0.9)
Monocytes: 8 %
Neutrophils Absolute: 3.3 10*3/uL (ref 1.4–7.0)
Neutrophils: 53 %
Platelets: 352 10*3/uL (ref 150–450)
RBC: 4.14 x10E6/uL (ref 3.77–5.28)
RDW: 11.9 % (ref 11.7–15.4)
WBC: 6.2 10*3/uL (ref 3.4–10.8)

## 2020-03-07 LAB — TSH: TSH: 1.67 u[IU]/mL (ref 0.450–4.500)

## 2020-03-07 LAB — VITAMIN B1: Thiamine: 101.1 nmol/L (ref 66.5–200.0)

## 2020-03-07 LAB — B12 AND FOLATE PANEL
Folate: 13.3 ng/mL (ref >3.0)
Vitamin B-12: 419 pg/mL (ref 232–1245)

## 2020-03-07 LAB — HOMOCYSTEINE: Homocysteine: 15.2 umol/L (ref 0.0–21.3)

## 2020-03-07 MED ORDER — COLCHICINE 0.6 MG PO TABS
0.6000 mg | ORAL_TABLET | Freq: Every day | ORAL | 1 refills | Status: DC | PRN
Start: 2020-03-07 — End: 2021-01-16

## 2020-03-07 NOTE — Progress Notes (Signed)
   HPI: 84 y.o. female PMHx chronic gout presenting today with a new complaint involving an acute gout flareup to the left great toe joint.  Patient states that the gout began approximately 1 week ago.  She currently takes allopurinol daily for prophylaxis.  Patient states that over the last few weeks she has been eating ribs and seafood which she believes may be caused the gout attack.  She has received anti-inflammatory steroidal injections in the past which have helped significantly.  She presents for further treatment evaluation  Past Medical History:  Diagnosis Date  . Anxiety   . Arthritis   . Atypical chest pain 11/12/2017  . Breast cancer ([place])   . Essential hypertension 11/12/2017  . Gout   . Hypertension    dr Karl[place] Lemon  . Primary localized osteoarthritis of left knee 01/26/2019  . Stroke [facility])    tia    06     Physical Exam: General: The patient is alert and oriented x3 in no acute distress.  Dermatology: Skin is warm, dry and supple bilateral lower extremities. Negative for open lesions or macerations.  Vascular: Palpable pedal pulses bilaterally. No edema or erythema noted. Capillary refill within normal limits.  Neurological: Epicritic and protective threshold grossly intact bilaterally.   Musculoskeletal Exam: Range of motion within normal limits to all pedal and ankle joints bilateral. Muscle strength 5/5 in all groups bilateral.  Erythema with edema plus pain on palpation and range of motion to the first MTPJ of the left foot  Assessment: 1.  First MTPJ capsulitis/acute gout left great toe   Plan of Care:  1. Patient evaluated.  2.  Injection of 0.5 cc Celestone Soluspan injected in the first MTPJ of the left foot 3.  Prescription for colchicine 0.6 mg.  2 tablets stat then once daily for the next week or 2 until symptoms resolve with food 4.  Postsurgical shoe dispensed today.  Weightbearing as tolerated 5.  Return to clinic in 3 weeks      Edrick Kins,  DPM [facility]  Dr. Edrick Kins, DPM    [address],  [address]                Office [phone]  Fax [phone]

## 2020-03-08 NOTE — Telephone Encounter (Signed)
Spoke with pt's daughter Casey Taylor (on [place]) and discussed results of MRI brain and recommendations by Dr Jaynee Eagles for CT-A head & neck and repeat MRI brain in 3-4 months if agreed by pt/family. She verbalized understanding and her questions were answered. She agreed to proceed with further testing by CT-A head and neck and repeat MRI. Also aware of importance for patient to proceed with formal memory testing and FDG pet scan. She verbalized appreciation for the call.   I called the patient and discussed the results with her as well as the recommendations.  Patient is in agreement to proceed with further resting and repeat MRI in 3-4 months. Her questions were answered and she was made aware that the daughter Casey Taylor was called. She verbalized understanding and appreciation for the call.

## 2020-03-09 ENCOUNTER — Telehealth: Payer: Self-pay | Admitting: Neurology

## 2020-03-09 ENCOUNTER — Other Ambulatory Visit: Payer: Self-pay | Admitting: Neurology

## 2020-03-09 DIAGNOSIS — I776 Arteritis, unspecified: Secondary | ICD-10-CM

## 2020-03-09 DIAGNOSIS — R9082 White matter disease, unspecified: Secondary | ICD-10-CM

## 2020-03-09 DIAGNOSIS — Q283 Other malformations of cerebral vessels: Secondary | ICD-10-CM

## 2020-03-09 NOTE — Telephone Encounter (Signed)
Medicare/bcbs fed order sent to [facility]. No auth they will reach out to the patient to schedule.  °

## 2020-03-21 ENCOUNTER — Ambulatory Visit (HOSPITAL_COMMUNITY)
Admission: RE | Admit: 2020-03-21 | Discharge: 2020-03-21 | Disposition: A | Discharge status: Home / Self Care | Payer: Medicare Other | Source: Ambulatory Visit | Attending: Neurology | Admitting: Neurology

## 2020-03-21 ENCOUNTER — Other Ambulatory Visit: Payer: Self-pay

## 2020-03-21 DIAGNOSIS — Z818 Family history of other mental and behavioral disorders: Secondary | ICD-10-CM

## 2020-03-21 DIAGNOSIS — F0390 Unspecified dementia without behavioral disturbance: Secondary | ICD-10-CM

## 2020-03-21 DIAGNOSIS — F0280 Dementia in other diseases classified elsewhere without behavioral disturbance: Secondary | ICD-10-CM | POA: Diagnosis not present

## 2020-03-21 DIAGNOSIS — R413 Other amnesia: Secondary | ICD-10-CM

## 2020-03-21 DIAGNOSIS — G309 Alzheimer's disease, unspecified: Secondary | ICD-10-CM

## 2020-03-21 LAB — GLUCOSE, CAPILLARY: Glucose-Capillary: 109 mg/dL — ABNORMAL HIGH (ref 70–99)

## 2020-03-21 MED ORDER — FLUDEOXYGLUCOSE F - 18 (FDG) INJECTION
11.3000 | Freq: Once | INTRAVENOUS | Status: AC | PRN
  Administered 2020-03-21: 10.5 via INTRAVENOUS

## 2020-03-22 ENCOUNTER — Telehealth: Payer: Self-pay | Admitting: *Deleted

## 2020-03-22 NOTE — Telephone Encounter (Signed)
I called both the patient and her daughter Casey Taylor (on [place]) and discussed the pet metabolic brain scan results. Their questions were answered. I let them know that we will call when we have the CTA results which are scheduled for this Friday, 10/8. They understand the pet scan does not rule out other types of Dementia so further testing with formal memory testing is still warranted.  They verbalized appreciation.

## 2020-03-22 NOTE — Telephone Encounter (Signed)
-----   Message from Melvenia Beam, MD sent at 03/21/2020  6:01 PM EDT ----- FDG PET Scan did not show Alzheimer's dementia or Frontotemporal demenita which is great news. However it does not rule out other types of dementia and they need to proceed to the formal memory testing thanks

## 2020-03-25 ENCOUNTER — Other Ambulatory Visit: Payer: Self-pay

## 2020-03-25 ENCOUNTER — Ambulatory Visit: Payer: Medicare Other | Attending: Internal Medicine

## 2020-03-25 ENCOUNTER — Other Ambulatory Visit (HOSPITAL_BASED_OUTPATIENT_CLINIC_OR_DEPARTMENT_OTHER): Payer: Self-pay | Admitting: Internal Medicine

## 2020-03-25 ENCOUNTER — Other Ambulatory Visit: Payer: Medicare Other

## 2020-03-25 ENCOUNTER — Ambulatory Visit
Admission: RE | Admit: 2020-03-25 | Discharge: 2020-03-25 | Disposition: A | Discharge status: Home / Self Care | Payer: Medicare Other | Source: Ambulatory Visit | Attending: Neurology | Admitting: Neurology

## 2020-03-25 DIAGNOSIS — R9082 White matter disease, unspecified: Secondary | ICD-10-CM

## 2020-03-25 DIAGNOSIS — Q283 Other malformations of cerebral vessels: Secondary | ICD-10-CM

## 2020-03-25 DIAGNOSIS — Z23 Encounter for immunization: Secondary | ICD-10-CM

## 2020-03-25 DIAGNOSIS — I776 Arteritis, unspecified: Secondary | ICD-10-CM

## 2020-03-25 MED ORDER — IOPAMIDOL (ISOVUE-370) INJECTION 76%
75.0000 mL | Freq: Once | INTRAVENOUS | Status: AC | PRN
  Administered 2020-03-25: 75 mL via INTRAVENOUS

## 2020-03-25 NOTE — Progress Notes (Signed)
   Covid-19 Vaccination Clinic  Name:  Casey Taylor    MRN: [id number] DOB: 28-Oct-1935  03/25/2020  Casey Taylor was observed post Covid-19 immunization for 15 minutes without incident. She was provided with Vaccine Information Sheet and instruction to access the V-Safe system. Vaccinated by [facility].  Casey Taylor was instructed to call 911 with any severe reactions post vaccine: Marland Kitchen Difficulty breathing  . Swelling of face and throat  . A fast heartbeat  . A bad rash all over body  . Dizziness and weakness

## 2020-03-29 ENCOUNTER — Telehealth: Payer: Self-pay | Admitting: *Deleted

## 2020-03-29 NOTE — Telephone Encounter (Signed)
-----   Message from Melvenia Beam, MD sent at 03/28/2020  7:06 PM EDT ----- Casey Taylor: No large vessel occlusion, hemodynamically significant stenosis, or evidence of dissection within the head or neck. CT Angio of the head and neck were unremarkable. We did see one artery that looked narrowed but may be congenital (often one vertebral artery is smaller than the other) but  I recommend that she make sure her cholesterol and blood pressure are well managed but overall. A chest nodule was seen and several thyroid nodules and she can followup non-emergently with primary care for CT of the chest and possibly thyroid ultrasound(CCing pcp). thanks

## 2020-03-29 NOTE — Telephone Encounter (Signed)
I spoke with pt's daughter Kenney Houseman (on [place]) and discussed the detailed message from Dr Jaynee Eagles regarding results of pt's CT angio head, neck. Her questions were answered. She is aware of the recommendations to follow up non-emergently with PCP for thyroid nodules and lung nodule incidentally found on CT. 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 Kenney Houseman stated she would discuss the results with patient and stated pt will be following up with PCP soon. She verbalized appreciation for the call.

## 2020-04-01 MED FILL — PFIZER-BIONTECH COVID-19 VA: 30 | 1 days supply | Qty: 0 | Fill #0

## 2020-04-15 ENCOUNTER — Other Ambulatory Visit: Payer: Self-pay | Admitting: Family

## 2020-04-15 DIAGNOSIS — Z1231 Encounter for screening mammogram for malignant neoplasm of breast: Secondary | ICD-10-CM

## 2020-05-24 ENCOUNTER — Ambulatory Visit
Admission: RE | Admit: 2020-05-24 | Discharge: 2020-05-24 | Disposition: A | Discharge status: Home / Self Care | Payer: Medicare Other | Source: Ambulatory Visit | Attending: Family | Admitting: Family

## 2020-05-24 ENCOUNTER — Other Ambulatory Visit: Payer: Self-pay

## 2020-05-24 DIAGNOSIS — Z1231 Encounter for screening mammogram for malignant neoplasm of breast: Secondary | ICD-10-CM

## 2020-06-14 ENCOUNTER — Encounter: Payer: Self-pay | Admitting: Psychology

## 2020-06-14 ENCOUNTER — Other Ambulatory Visit: Payer: Self-pay

## 2020-06-14 ENCOUNTER — Encounter: Payer: Medicare Other | Attending: Psychology | Admitting: Psychology

## 2020-06-14 DIAGNOSIS — R413 Other amnesia: Secondary | ICD-10-CM | POA: Insufficient documentation

## 2020-06-14 DIAGNOSIS — F419 Anxiety disorder, unspecified: Secondary | ICD-10-CM

## 2020-06-14 NOTE — Progress Notes (Signed)
Neuropsychological Consultation   Patient:   Casey Taylor   DOB:   12/04/1935  MR Number:  [id number]  Location:  [facility] [place], [place] [id number] [facility] [place] [address] Dept: [phone]           Date of Service:   06/14/2020  Start Time:   10 AM End Time:   12 PM  Today's visit was an in person visit that was conducted in my outpatient clinic office.  The patient, her son and daughter-in-law were present for this clinical interview and the patient's daughter provided help in completing the neuropsychological history forms with written answers to history questions.  Provider/Observer:  Ilean Skill, Psy.D.       Clinical Neuropsychologist       Billing Code/Service: 96116/9121  Chief Complaint:    Casey Taylor is an 84 year old female referred by Sarina Ill, MD for neuropsychological evaluation as part of the broader neurological work-up requested by the patient's PCP Sheran Fava FNP.  This request for work-up is due to the patient's daughter describing the noting more memory changes over the past year roughly.  Most of these changes are noted over the past several months by the daughter.  The patient has a past medical history of hypertension, OA, gout, anxiety, breast cancer.  The patient also has a history of past TIA/"mini stroke" that the family noted to have occurred at approximately 2006.  This TIA produced left-sided muscle weakness that resolved shortly and patient returned to baseline.  The patient also has a history of high blood pressure that is well managed with medication.  Reason for Service:  Casey Taylor is an 84 year old female referred by Sarina Ill, MD for neuropsychological evaluation as part of the broader neurological work-up requested by the patient's PCP Sheran Fava FNP.  This request for work-up  is due to the patient's daughter describing the noting more memory changes over the past year roughly.  Most of these changes are noted over the past several months by the daughter.  The patient has a past medical history of hypertension, OA, gout, anxiety, breast cancer.  The patient also has a history of past TIA/"mini stroke" that the family noted to have occurred at approximately 2006.  This TIA produced left-sided muscle weakness that resolved shortly and patient returned to baseline.  The patient also has a history of high blood pressure that is well managed with medication.  The patient's daughter has reported that she has noticed more issues with forgetfulness periodically and forgetting or having trouble recalling events and conversations.  The patient's son was in person for this clinical interview and the patient and son report that they have not noticed much change but that his sister has noticed and reported to him seeing difficulties the patient is having particularly since August and September of this year.  However, the patient spent most of her time with her daughter and the son lives in [place].  The patient is also noted to have changes in thinking including reasoning and problem-solving changes as well as loss of motivation and some mild gait issues that may be related to pain in her knee.  The patient and her son deny any indications of any tremors, changes in expressive or receptive language patterns or any difficulties with geographic orientation or visual-spatial deficits.  While the patient minimizes any memory difficulties the patient does acknowledge some difficulties and  stressors with changes in life due to Covid restrictions.  She has not been able to spend times with her friends and is feeling the stress not only her self but the stress of others.  The patient enjoyed being very social and this is because difficulties.  The patient actively works on cognitive exercises such as  puzzles and reading magazines as well as doing word searches.  There is a positive Family history of dementia with the patient's mother developing memory deficits and confusion in the mother's late 82s or early 24s.  I do not think he was ever formally diagnosed and the family did not know what the particular cause of this was.  During the neurological evaluation with Dr. Daisy Blossom the patient scored a 23 out of 30 on the Mini-Mental state exam.  During that evaluation the patient did minimize some of her noted difficulties and the patient was the one with primary concerns for progressive changes over the past several months.  The patient had an MRI completed on 03/01/2020.  The impression of this MRI indicated a small (5 mm) right frontal juxtacortical T2 flair hyperintense lesion noted.  There was a consideration made as possible etiological factors including subacute autoimmune, inflammatory, vascular post infectious etiologies.  Not sure if the radiologist was aware of previous TIA in 2006 and given the patient's reported symptoms during her TIA this finding may be reflexive of a very small stroke that occurred in 2006.  The patient also had a PET scan performed on 03/21/2020 the findings showed no decreased relative cortical metabolism to suggest frontotemporal dementia or Alzheimer's disease type pathology findings.  There was moderate generalized atrophy noted on CT portion of exam and no other particular findings of note.  There was also a head and neck CTA performed on 03/25/2020 with no acute intracranial abnormality noted for brain CT.  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  During the clinical interview today, the patient denied any falls or dizziness with head turn or other mechanical changes in her neck placement and denied any symptoms that would  be consistent with particular vertebral artery insufficiency syndrome type symptoms.  Behavioral Observation: Casey Taylor  presents as a 84 y.o.-year-old Right African American Female who appeared her stated age. her dress was Appropriate and she was Well Groomed and her manners were Appropriate to the situation.  her participation was indicative of Appropriate and Attentive behaviors.  There were not physical disabilities noted.  she displayed an appropriate level of cooperation and motivation.     Interactions:    Active Appropriate  Attention:   within normal limits and attention span and concentration were age appropriate  Memory:   There were no noted indications of any significant memory deficits today although the patient has reports by her daughter of significant forgetfulness particularly for remembering conversations and events.;   Visuo-spatial:  not examined  Speech (Volume):  normal  Speech:   normal; normal  Thought Process:  Coherent and Relevant  Though Content:  WNL; not suicidal and not homicidal  Orientation:   person, place, time/date and situation  Judgment:   Good  Planning:   Fair  Affect:    Appropriate  Mood:    Euthymic  Insight:   Good  Intelligence:   normal  Marital Status/Living: The patient was born and raised in [place]  [place] along with 2 siblings.  The patient currently lives by herself and has significant interactions with her daughter.  The patient is divorced after being married 27 years.  The patient has 5 adult children between the ages of 70 and 15.  Current Employment: The patient is retired.  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  Substance Use:  No concerns of substance abuse are reported.  The patient only acknowledges limited social alcohol consumption.  Education:   HS Graduate  Medical History:   Past  Medical History:  Diagnosis Date  . Anxiety   . Arthritis   . Atypical chest pain 11/12/2017  . Breast cancer ([place])   . Essential hypertension 11/12/2017  . Gout   . Hypertension    dr Karlton Lemon  . Primary localized osteoarthritis of left knee 01/26/2019  . Stroke [facility])    tia    06         Patient Active Problem List   Diagnosis Date Noted  . Short-term memory loss 02/29/2020  . FHx: dementia 02/29/2020  . Primary localized osteoarthritis of left knee 01/26/2019  . Anxiety   . Stroke ([employer])   . Essential hypertension 11/12/2017  . Atypical chest pain 11/12/2017  . Gout 08/07/2013  . Bunion 08/07/2013  . Osteoarthritis (arthritis due to wear and tear of joints) 08/07/2013  . Breast cancer of upper-inner quadrant of right female breast ([place]) 04/16/2013              Psychiatric History:  No prior psychiatric history as noted but the patient has had some degree of anxiety particularly around more recent Covid related issues and life changes.  Family Med/Psych History:  Family History  Problem Relation Age of Onset  . Heart attack Mother   . Diabetes Mother   . Dementia Mother   . Heart attack Father   . Hypertension Sister   . Diabetes Sister   . Diabetes Brother   . Kidney disease Brother   . Esophageal cancer Brother   . Lung cancer Brother   . Prostate cancer Brother   . Diabetes Brother    Impression/DX:  Casey Taylor is an 84 year old female referred by Sarina Ill, MD for neuropsychological evaluation as part of the broader neurological work-up requested by the patient's PCP Sheran Fava FNP.  This request for work-up is due to the patient's daughter describing the noting more memory changes over the past year roughly.  Most of these changes are noted over the past several months by the daughter.  The patient has a past medical history of hypertension, OA, gout, anxiety, breast cancer.  The patient also has a history of past TIA/"mini stroke" that the  family noted to have occurred at approximately 2006.  This TIA produced left-sided muscle weakness that resolved shortly and patient returned to baseline.  The patient also has a history of high blood pressure that is well managed with medication.  Disposition/Plan:  We have set the patient up for formal neuropsychological testing and will use a formal foundational battery of the Wechsler Adult Intelligence Scale-IV as well as the Wechsler Memory Scale's.  Additions may be made to this test battery once we get through the foundational testing if warranted.  Diagnosis:    Short-term memory loss  Anxiety         Electronically Signed   _______________________ Ilean Skill, Psy.D. Clinical Neuropsychologist

## 2020-06-29 ENCOUNTER — Telehealth: Payer: Self-pay | Admitting: Neurology

## 2020-06-29 NOTE — Telephone Encounter (Signed)
Called patient to inform, rescheduled her over the phone for 09/26/20 at 11:00.

## 2020-06-29 NOTE — Telephone Encounter (Addendum)
Please cancel patient's appointment tomorrow and reschedule for April, all her testing is not completed yet she has appointment in February and March for formal neurocognitive testing and I need that back prior especially since PET Scan and MRI have been negative. Unless patient has any new or acute symptoms they need addressed, I would prefer to wait until the memory testing has completed. Also Covid is at a peak. But happy to see them in the office if they like. If they are not coming in, please block the spot due to staffing shortages thanks

## 2020-06-29 NOTE — Progress Notes (Deleted)
[facility]    Provider:  Dr Jaynee Eagles Requesting Provider: Suella Broad Primary Care Provider:  Suella Broad, FNP  CC:  Memory impairment  HPI:  Casey Taylor is a 85 y.o. female here as requested by Suella Broad,* for memory loss. PMHx HT, OA, gout, anxiety, breast cancer stage 0 DCIS.  I reviewed Casey Taylor notes, patient with increased forgetfulness, family becoming concerned about her memory impairment particularly short-term memory, concern is for dementia versus age-related changes;otherwise  no significant history of memory loss documented on notes provided by requesting provider or labs provided.  I did review epic notes, she was discharged from [facility] January 27, 2019 after operative treatment of primary localized Arnell Sieving arthritis of left knee with severe unremitting pain and had a total knee arthroplasty.  Patient appeared to do well without any problems and was discharged the next day.  I do not see any brain imaging ordered in the past.  I also reviewed "care everywhere" and found no prior labs, documents or imaging.She went to stay with her son and she was supposed to stay a month and she was upset like they left her there and for the family they think this is unusual. They were having a conversation at the table and she will keep asking the same questions. Her granddaughter and similar, Multiple family members have noticed.   She is here with her daughter who provides most information. Patient doesn't feel like she is having any memory concerns. She feels it is covid, she can't go out, she used to love to be social, and when covid came it "knocked you back", she can;t see her friends, she feels even young people are depressed. She was extremely social and going to conventions. Daughter has told her mother she can't live in fear and she understands but she can't live in constant fear. She has been vaccinated. She  works puzzles. She looks at magazines and word searches. Also sudukos and puzzles. She is doing things. She is getting older. Her mother had dementia. Lately they will tell her something and 5 minutes later she will ask again. Periods of forgetfullness. She moved from [place] and couldn't remember about her car and the things they were supposed to do with her car. They are alarmed by the memory loss, more short term. Mother had dementia.   Reviewed notes, labs and imaging from outside physicians, which showed: see above  Review of Systems: Patient complains of symptoms per HPI as well as the following symptoms: memory loss. Pertinent negatives and positives per HPI. All others negative.   Social History   Socioeconomic History  . Marital status: Divorced    Spouse name: Not on file  . Number of children: 5  . Years of education: Not on file  . Highest education level: Some college, no degree  Occupational History  . Not on file  Tobacco Use  . Smoking status: Never Smoker  . Smokeless tobacco: Never Used  Vaping Use  . Vaping Use: Never used  Substance and Sexual Activity  . Alcohol use: Yes    Alcohol/week: 1.0 standard drink    Types: 1 Glasses of wine per week    Comment: weekly  . Drug use: No  . Sexual activity: Not on file  Other Topics Concern  . Not on file  Social History Narrative   Lives at home alone    Caffeine: coffee maybe 1 cup daily   Right handed  Social Determinants of Health   Financial Resource Strain: Not on file  Food Insecurity: Not on file  Transportation Needs: Not on file  Physical Activity: Not on file  Stress: Not on file  Social Connections: Not on file  Intimate Partner Violence: Not on file    Family History  Problem Relation Age of Onset  . Heart attack Mother   . Diabetes Mother   . Dementia Mother   . Heart attack Father   . Hypertension Sister   . Diabetes Sister   . Diabetes Brother   . Kidney disease Brother   . Esophageal  cancer Brother   . Lung cancer Brother   . Prostate cancer Brother   . Diabetes Brother     Past Medical History:  Diagnosis Date  . Anxiety   . Arthritis   . Atypical chest pain 11/12/2017  . Breast cancer ([place])   . Essential hypertension 11/12/2017  . Gout   . Hypertension    dr Karlton Lemon  . Primary localized osteoarthritis of left knee 01/26/2019  . Stroke [facility])    tia    06    Patient Active Problem List   Diagnosis Date Noted  . Short-term memory loss 02/29/2020  . FHx: dementia 02/29/2020  . Primary localized osteoarthritis of left knee 01/26/2019  . Anxiety   . Stroke ([place])   . Essential hypertension 11/12/2017  . Atypical chest pain 11/12/2017  . Gout 08/07/2013  . Bunion 08/07/2013  . Osteoarthritis (arthritis due to wear and tear of joints) 08/07/2013  . Breast cancer of upper-inner quadrant of right female breast ([place]) 04/16/2013    Past Surgical History:  Procedure Laterality Date  . ABDOMINAL HYSTERECTOMY    . BREAST BIOPSY Left 05/26/2013   Procedure: BREAST BIOPSY WITH NEEDLE LOCALIZATION;  Surgeon: Stark Klein, MD;  Location: [facility];  Service: General;  Laterality: Left;  . BREAST LUMPECTOMY Right 2014  . CHOLECYSTECTOMY    . PARTIAL MASTECTOMY WITH NEEDLE LOCALIZATION Right 05/26/2013   Procedure: PARTIAL MASTECTOMY WITH NEEDLE LOCALIZATION;  Surgeon: Stark Klein, MD;  Location: [facility];  Service: General;  Laterality: Right;  . TOTAL KNEE ARTHROPLASTY Left 01/26/2019   Procedure: TOTAL KNEE ARTHROPLASTY;  Surgeon: Elsie Saas, MD;  Location: [facility];  Service: Orthopedics;  Laterality: Left;    Current Outpatient Medications  Medication Sig Dispense Refill  . allopurinol (ZYLOPRIM) 100 MG tablet Take 1 tablet (100 mg total) by mouth daily. 30 tablet 6  . chlorthalidone (HYGROTON) 25 MG tablet Take 25 mg by mouth daily. take with lots of water.    . Cholecalciferol (VITAMIN D PO) Take 2,000 Units by mouth daily.     . colchicine 0.6 MG tablet Take 1  tablet (0.6 mg total) by mouth daily as needed (gout). 30 tablet 1  . gabapentin (NEURONTIN) 300 MG capsule Take 1 capsule (300 mg total) by mouth 3 (three) times daily. 90 capsule 1  . losartan (COZAAR) 25 MG tablet Take 25 mg by mouth daily.    . Magnesium 250 MG TABS Take 250 mg by mouth.     . meloxicam (MOBIC) 15 MG tablet Take 15 mg by mouth daily.    . potassium gluconate 595 MG TABS tablet Take 595 mg by mouth daily.     No current facility-administered medications for this visit.    Allergies as of 06/30/2020 - Review Complete 06/14/2020  Allergen Reaction Noted  . Codeine Nausea Only 02/15/2012    Vitals: There were no  vitals taken for this visit. Last Weight:  Wt Readings from Last 1 Encounters:  02/29/20 189 lb (85.7 kg)   Last Height:   Ht Readings from Last 1 Encounters:  02/29/20 5\' 2"  (1.575 m)     Physical exam: Exam: Gen: NAD, conversant, well nourised, obese, well groomed                     CV: RRR, no MRG. No Carotid Bruits. No peripheral edema, warm, nontender Eyes: Conjunctivae clear without exudates or hemorrhage  Neuro: Detailed Neurologic Exam  Speech:    Speech is normal; fluent and spontaneous with normal comprehension.  Cognition:    MMSE - Mini Mental State Exam 02/29/2020  Orientation to time 5  Orientation to Place 4  Registration 3  Attention/ Calculation 0  Recall 3  Language- name 2 objects 2  Language- repeat 1  Language- follow 3 step command 3  Language- read & follow direction 1  Write a sentence 1  Copy design 0  Total score 23    Cranial Nerves:    The pupils are equal, round, and reactive to light. Attempted fundoscopy could not visualize due to small pupils. Visual fields are full to finger confrontation. Extraocular movements are intact. Trigeminal sensation is intact and the muscles of mastication are normal. The face is symmetric. The palate elevates in the midline. Hearing intact. Voice is normal. Shoulder shrug is  normal. The tongue has normal motion without fasciculations.   Coordination:    No dysmetria or ataxia  Gait:    not ataxic, not shuffling, adequate stride and arm swing for age but some low clearance and slight bradykinesia   Motor Observation:    No asymmetry, no atrophy, and no involuntary movements noted. Tone:    Normal muscle tone.    Posture:    Posture is normal. normal erect    Strength:    Strength is V/V in the upper and lower limbs.      Sensation: intact to LT     Reflex Exam:  DTR's:    Deep tendon reflexes in the upper and lower extremities are symmetrical bilaterally.   Toes:    The toes are downgoing bilaterally.   Clonus:    Clonus is absent.    Assessment/Plan:  85 y.o. female here as requested by Suella Broad,* for memory loss. PMHx HT, OA, gout, anxiety, breast cancer stage 0 DCIS.  I reviewed Casey Taylor notes, patient with increased forgetfulness, family becoming concerned about her memory impairment particularly short-term memory, concern is for dementia versus age-related changes;  MMSE 23 out of 30, patient is here with daughter who provides most information, symptoms are concerning for mild cognitive impairment possibly early neurodegenerative disorder such as Alzheimer's. Patient does have a history of dementia in her family. We'll do a thorough evaluation including MRI of the brain, blood work, formal neuro cognitive testing, and FDG PET scan to differentiate between her types of dementia including frontotemporal and Lewy body due to some other symptoms that may be more lewy-body such as some Changes in thinking and reasoning as well as some loss of motivation, some mild gait issues.   MRI of the brain FDG PET Scan Neuropsych testing labs  No orders of the defined types were placed in this encounter.  No orders of the defined types were placed in this encounter.   Cc: Elliot Dally,  FNP  Casey Ill, MD  [facility] [address] [place], [place] [address]  Phone [phone] Fax [phone]  I spent over 60 minutes of face-to-face and non-face-to-face time with patient on the  No diagnosis found. diagnosis.  This included previsit chart review, lab review, study review, order entry, electronic health record documentation, patient education on the different diagnostic and therapeutic options, counseling and coordination of care, risks and benefits of management, compliance, or risk factor reduction

## 2020-06-30 ENCOUNTER — Ambulatory Visit: Payer: Medicare Other | Admitting: Neurology

## 2020-07-25 ENCOUNTER — Encounter: Payer: Medicare Other | Attending: Psychology | Admitting: Psychology

## 2020-07-25 ENCOUNTER — Encounter: Payer: Self-pay | Admitting: Psychology

## 2020-07-25 ENCOUNTER — Other Ambulatory Visit: Payer: Self-pay

## 2020-07-25 DIAGNOSIS — R413 Other amnesia: Secondary | ICD-10-CM | POA: Diagnosis not present

## 2020-07-25 DIAGNOSIS — Z8673 Personal history of transient ischemic attack (TIA), and cerebral infarction without residual deficits: Secondary | ICD-10-CM | POA: Insufficient documentation

## 2020-07-25 DIAGNOSIS — Z853 Personal history of malignant neoplasm of breast: Secondary | ICD-10-CM | POA: Insufficient documentation

## 2020-07-25 DIAGNOSIS — F419 Anxiety disorder, unspecified: Secondary | ICD-10-CM | POA: Diagnosis not present

## 2020-07-25 DIAGNOSIS — I10 Essential (primary) hypertension: Secondary | ICD-10-CM | POA: Diagnosis not present

## 2020-07-25 NOTE — Progress Notes (Addendum)
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 Rest breaks were offered.  She did not appear to have difficulty seeing (wore glasses), hearing, or understanding instructions/items but did require additional prompting. She did not appear overtly distressed by the testing session, per behavioral observation or via self-report.  Described mood as good but she did present as irritable and easily frustrated.  Complained of back pain (I.e., sciatica). Thought process was tangential and concrete.   She was mostly oriented (incorrect date but correct year and month; accurately named current [place] president and his predecessor   Tests Administered:  Wechsler Adult Intelligence Scale, 4th Edition (WAIS-IV)  Wechsler Memory Scale, 4th Edition (WMS-IV), Older Adult Battery   Results:  Composite Score Summary  Scale Sum of Scaled Scores Composite Score Percentile Rank 95% Conf. Interval Qualitative Description  Verbal Comprehension 20 VCI 81 10 76-87 Low Average  Perceptual Reasoning 24 PRI 88 21 82-95 Low Average  Working Memory 11 WMI 74 4 69-82 Borderline  Processing Speed 19 PSI 97 42 89-106 Average  Full Scale 74 FSIQ 82 12 78-86 Low Average  General Ability 44 GAI 82 12 78-87 Low Average    Verbal Comprehension Subtests Summary  Subtest Raw Score Scaled Score Percentile Rank Reference Group Scaled Score SEM  Similarities 15 7 16 5  0.90  Vocabulary 19 7 16 6  0.73  Information 6 6 9 6  0.73    Perceptual Reasoning Subtests Summary  Subtest Raw Score Scaled Score Percentile Rank Reference Group Scaled Score SEM  Block Design 24 10 50 6 1.34  Matrix Reasoning 6 7 16 3  1.12  Visual Puzzles 6 7 16 4  1.27    Working Doctor, general practice Raw Score Scaled Score Percentile Rank Reference Group Scaled Score SEM  Digit Span 13 4 2 2  0.85  Arithmetic 8 7 16 5  0.99    Working Memory  Process Score Summary  Process Score Raw Score Scaled Score Percentile Rank Base Rate SEM  Digit Span Forward 7 7 16  -- 1.31  Digit Span Backward 5 7 16  -- 1.44  Digit Span Sequencing 1 3 1  -- 1.20  Longest Digit Span Forward 5 -- -- 93.0 --  Longest Digit Span Backward 3 -- -- 90.0 --  Longest Digit Span Sequence 3 -- -- 91.0 --    Processing Speed Subtests Summary  Subtest Raw Score Scaled Score Percentile Rank Reference Group Scaled Score SEM  Symbol Search 13 8 25 3  1.12  Coding 44 11 63 5 1.12    Index Score Summary  Index Sum of Scaled Scores Index Score Percentile Rank 95% Confidence Interval Qualitative Descriptor  Auditory Memory (AMI) 35 93 32 87-100 Average  Visual Memory (VMI) 14 84 14 80-89 Low Average  Immediate Memory (IMI) 23 85 16 80-92 Low Average  Delayed Memory (DMI) 26 91 27 84-99 Average    Primary Subtest Scaled Score Summary  Subtest Domain Raw Score Scaled Score Percentile Rank  Logical Memory I AM 18 7 16   Logical Memory II AM 10 9 37  Verbal [employer] I AM 16 9 37  Verbal [facility] II AM 5 10 50  Visual Reproduction I VM 18 7 16   Visual Reproduction II VM 6 7 16   Symbol Span VWM 10 9 37    Auditory Memory Process Score Summary  Process Score Raw Score Scaled Score Percentile Rank Cumulative Percentage (Base Rate)  LM II Recognition 18 - -  51-75%  VPA II Recognition 26 - - 26-50%    Visual Memory Process Score Summary  Process Score Raw Score Scaled Score Percentile Rank Cumulative Percentage (Base Rate)  VR II Recognition 3 - - 26-50%    ABILITY-MEMORY ANALYSIS  Ability Score:  GAI: 82 Date of Testing:  WAIS-IV; WMS-IV 2020/07/25  Predicted Difference Method   Index Predicted WMS-IV Index Score Actual WMS-IV Index Score Difference Critical Value  Significant Difference Y/N Base Rate  Auditory Memory 90 93 -3 9.33 N   Visual Memory 89 84 5 7.72 N   Immediate Memory 88 85 3 10.41 N   Delayed Memory 90 91 -1  10.86 N   Statistical significance (critical value) at the .01 level.   Contrast Scaled Scores  Score Score 1 Score 2 Contrast Scaled Score  General Ability Index vs. Auditory Memory Index 82 93 10  General Ability Index vs. Visual Memory Index 82 84 9  General Ability Index vs. Immediate Memory Index 82 85 9  General Ability Index vs. Delayed Memory Index 82 91 10  Verbal Comprehension Index vs. Auditory Memory Index 81 93 11  Perceptual Reasoning Index vs. Visual Memory Index 88 84 8  Working Memory Index vs. Auditory Memory Index 74 93 11     Feedback with Patient:  Patient will return on 09/06/20 for an interactive feedback session with Casey Taylor at which time her test performances, clinical impressions and treatment recommendations will be reviewed in detail. The patient understands she can contact our office should she require our assistance before this time.  Full report to follow

## 2020-07-27 ENCOUNTER — Encounter (HOSPITAL_BASED_OUTPATIENT_CLINIC_OR_DEPARTMENT_OTHER): Payer: Medicare Other | Admitting: Psychology

## 2020-07-27 ENCOUNTER — Encounter: Payer: Self-pay | Admitting: Psychology

## 2020-07-27 ENCOUNTER — Other Ambulatory Visit: Payer: Self-pay

## 2020-07-27 DIAGNOSIS — Z8673 Personal history of transient ischemic attack (TIA), and cerebral infarction without residual deficits: Secondary | ICD-10-CM | POA: Diagnosis not present

## 2020-07-27 DIAGNOSIS — F419 Anxiety disorder, unspecified: Secondary | ICD-10-CM

## 2020-07-27 DIAGNOSIS — R413 Other amnesia: Secondary | ICD-10-CM

## 2020-07-27 DIAGNOSIS — Z853 Personal history of malignant neoplasm of breast: Secondary | ICD-10-CM | POA: Diagnosis not present

## 2020-07-27 DIAGNOSIS — G459 Transient cerebral ischemic attack, unspecified: Secondary | ICD-10-CM | POA: Diagnosis not present

## 2020-07-27 DIAGNOSIS — I10 Essential (primary) hypertension: Secondary | ICD-10-CM | POA: Diagnosis not present

## 2020-07-27 NOTE — Progress Notes (Signed)
Neuropsychological Evaluation   Patient:  Casey Taylor   DOB: 1935-07-29  MR Number: [id number]  Location: [facility] PHYSICAL MEDICINE AND REHABILITATION [place], [address] [id number] [place] [id number] Dept: [phone]  Start: 10 AM End: 11 AM  Provider/Observer:     Edgardo Roys PsyD  Chief Complaint:      Chief Complaint  Patient presents with  . Memory Loss    Reason For Service:     Casey Taylor is an 85 year old female referred by Sarina Ill, MD for neuropsychological evaluation as part of the broader neurological work-up requested by the patient's PCP Sheran Fava FNP.  This request for work-up is due to the patient's daughter describing more memory changes over the past year roughly.  Increasing changes are noted over the past several months by the daughter.  The patient has a past medical history of hypertension, OA, gout, anxiety, breast cancer.  The patient also has a history of past TIA/"mini stroke" that the family reports to have occurred in approximately 2006.  This TIA produced left-sided muscle weakness that resolved shortly and patient returned to baseline.  The patient also has a history of high blood pressure that is well managed with medication.  The patient's daughter reports that she has noticed more issues with forgetfulness periodically and forgetting or having trouble recalling events and conversations.  The patient's son was in person for this clinical interview and the patient and son report that they have not noticed much change but that his sister has noticed and reported to him seeing difficulties.  The patient is described as having increasing memory difficulties since August and September of this year.  However, the patient spent most of her time with her daughter and the son lives in [place].  The patient has also noted to have changes in  thinking including reasoning and problem-solving changes as well as loss of motivation and some mild gait issues that may be related to pain in her knee.  The patient and her son deny any indications of any tremors, changes in expressive or receptive language patterns or any difficulties with geographic orientation or visual-spatial deficits.  While the patient minimizes any memory difficulties, the patient does acknowledge some difficulties and stressors with changes in life due to Covid restrictions.  She has not been able to spend time with her friends and is feeling the stress not only her self but the stress of others.  The patient enjoyed being very social and this limitation in social activities has caused difficulties.  The patient actively works on cognitive exercises such as puzzles and reading magazines as well as doing word searches.  There is a positive Family history of dementia with the patient's mother developing memory deficits and confusion in the mother's late 55s or early 6s.  I do not think she was ever formally diagnosed and the family did not know what the particular cause of this was.  During the neurological evaluation with Dr. Lavell Anchors, the patient scored a 23 out of 30 on the Mini-Mental state exam.  During that evaluation the patient did minimize some of her noted difficulties and the patient was the one with primary concerns for progressive changes over the past several months.  The patient had an MRI completed on 03/01/2020.  The impression of this MRI indicated a small (5 mm) right frontal juxtacortical T2 flair hyperintense lesion noted.  There was a consideration made  as possible etiological factors including subacute autoimmune, inflammatory, vascular post infectious etiologies.  Not sure if the radiologist was aware of previous TIA in 2006 and given the patient's reported symptoms during her TIA this finding may be reflexive of a very small stroke that occurred in  2006.  The patient also had a PET scan performed on 03/21/2020 the findings showed no decreased relative cortical metabolism to suggest frontotemporal dementia or Alzheimer's disease type pathology findings.  There was moderate generalized atrophy noted on CT portion of exam and no other particular findings of note.  There was also a head and neck CTA performed on 03/25/2020 with no acute intracranial abnormality noted for brain CT.  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  During the clinical interview today, the patient denied any falls or dizziness with head turn or other mechanical changes in her neck placement and denied any symptoms that would be consistent with particular vertebral artery insufficiency syndrome type symptoms.  [place] completed 240 minutes of neuropsychological testing with this provider Health and safety inspector). The patient did not appear overtly distressed by the testing session, per behavioral observation or via self-report. Rest breaks were offered.   Tests Administered:  Wechsler Adult Intelligence Scale, 4th Edition (WAIS-IV)  Wechsler Memory Scale, 4th Edition (WMS-IV), Older Adult Battery   Results:  Test Results:   Initially, an estimation was made as to the patient's historical/premorbid intellectual and cognitive functioning to be used as a comparison for the current assessment.  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  Looking at other psychosocial variables along with education occupational history a conservative estimation of the patient's premorbid function is somewhere in the average to high average range  and we will utilize a rough estimate of 100 standard T-scores and 10 standard scale scores as the estimation for comparison sake.   Composite Score Summary   Scale Sum of Scaled Scores Composite Score Percentile Rank 95% Conf. Interval Qualitative Description  Verbal Comprehension 20 VCI 81 10 76-87 Low Average  Perceptual Reasoning 24 PRI 88 21 82-95 Low Average  Working Memory 11 WMI 74 4 69-82 Borderline  Processing Speed 19 PSI 97 42 89-106 Average  Full Scale 74 FSIQ 82 12 78-86 Low Average  General Ability 44 GAI 82 12 78-87 Low Average   The patient was administered the Wechsler Adult Intelligence Scale-IV to provide a well standardized well normed group of measures that assess a broad range of intellectual and cognitive functioning.  We initially calculated the patient's full-scale IQ score to provide a global composite assessment of overall cognitive functioning as well as calculating the patient's general abilities index which also provides a composite score with less emphasis on measures typically showing the most variability in susceptibility to acute changes.  The patient produced a full-scale IQ score of 82 which falls at the 12 percentile and is a low average range.  This measure is significantly below predicted levels based on historical variables and suggest 1 or more areas of reduced cognitive functioning.  The patient produced a general abilities index score of 82 as well which also fell at the 12 percentile and was in the low average range.  Verbal Comprehension Subtests Summary   Subtest Raw Score Scaled Score Percentile Rank Reference Group Scaled Score SEM  Similarities 15 7 16 5  0.90  Vocabulary 19 7 16 6  0.73  Information 6 6 9 6  0.73   The patient produced a verbal comprehension index score of 81 which falls at the 10th percentile and is in low average range.  This is below predicted levels as well but measures that are the least likely to change with  time and most stable where her most problematic and suggest a very focused educational and occupational history rather than a broad level of educational experiences.  The patient produced scores in the low average to mildly impaired range for verbal reasoning and problem solving and vocabulary knowledge and showed significant weaknesses with regard to her general fund of information.          Perceptual Reasoning Subtests Summary   Subtest Raw Score Scaled Score Percentile Rank Reference Group Scaled Score SEM  Block Design 24 10 50 6 1.34  Matrix Reasoning 6 7 16 3  1.12  Visual Puzzles 6 7 16 4  1.27    The patient produced a perceptual reasoning index score of 88 which falls at the 21st percentile and is in the low average range.  This is only mildly below levels predicted based on her education occupational history and in fact the patient did quite well on measures of visual analysis and organizational abilities.  She had mild deficits with regard to visual reasoning and problem solving and visual estimation and judgment abilities.  On measures that were part of the Weschler memory scales the patient also showed good visual constructional abilities.          Working Print production planner Raw Score Scaled Score Percentile Rank Reference Group Scaled Score SEM  Digit Span 13 4 2 2  0.85  Arithmetic 8 7 16 5  0.99   The patient produced a working memory index score of 74 which falls at the 4th percentile and is in the borderline range of functioning.  This is a significantly impaired score.  In fact, patient had severe deficits with regard to primary encoding abilities and in fact overall this was one of her lowest areas of overall functioning.  The patient did somewhat better on the arithmetic format suggesting good mathematical skills and allowed her to compensate to some degree for week primary encoding abilities.          Processing Speed Subtests Summary   Subtest Raw Score  Scaled Score Percentile Rank Reference Group Scaled Score SEM  Symbol Search 13 8 25 3  1.12  Coding 44 11 63 5 1.12    The patient produced a processing speed index score of 97 which falls at the 42nd percentile and is in the average range.  This measure suggest well preserved and intact information processing speed and overall speed of mental operations particularly for measures of visual scanning, visual searching and overall speed of mental operations.  This pattern is consistent with generally intact white matter tracts allowing for integration of various cortical brain regions.  This also suggest aspects of attention and concentration related to focus execute abilities also to be well preserved and that attentional deficits were primarily related to impaired encoding abilities rather than inability to shift attention, sustained attention, divided attention and allowed herself to be free of significant external distractibility.          Index Score Summary   Index Sum of  Scaled Scores Index Score Percentile Rank 95% Confidence Interval Qualitative Descriptor  Auditory Memory (AMI) 35 93 32 87-100 Average  Visual Memory (VMI) 14 84 14 80-89 Low Average  Immediate Memory (IMI) 23 85 16 80-92 Low Average  Delayed Memory (DMI) 26 91 27 84-99 Average   The patient was then administered the Wechsler Memory Scale-IV for older adults.  On this measure, while the patient performed slightly below predicted levels her memory performance.  To generally be intact.  In fact, her overall memory functioning exceeded her global intellectual and cognitive functioning.  There is also no significant difference between auditory and visual memory functions although auditory memory functions did perform somewhat better.  Breaking the patient's memory components down between auditory versus visual memory the patient produced an auditory memory index score of 93 which falls at the 32nd percentile and is in the  average range.  The patient's visual memory index score was 84 which fell at the 14th percentile and was in the low average range but not statistically significant difference between auditory versus visual memory.  Breaking the patient's memory functions down between immediate versus delayed memory the patient produced an immediate memory index score of 85 which fell at the 16th percentile and in the low average range.  In contrast to this, the patient produced a delayed memory index score of 91 which fell at the 27th percentile and is in the average range.          Primary Subtest Scaled Score Summary   Subtest Domain Raw Score Scaled Score Percentile Rank  Logical Memory I AM 18 7 16   Logical Memory II AM 10 9 37  Verbal Paired Associates I AM 16 9 37  Verbal Paired Associates II AM 5 10 50  Visual Reproduction I VM 18 7 16   Visual Reproduction II VM 6 7 16   Symbol Span VWM 10 9 37          Auditory Memory Process Score Summary   Process Score Raw Score Scaled Score Percentile Rank Cumulative Percentage (Base Rate)  LM II Recognition 18 - - 51-75%  VPA II Recognition 26 - - 26-50%          Visual Memory Process Score Summary   Process Score Raw Score Scaled Score Percentile Rank Cumulative Percentage (Base Rate)  VR II Recognition 3 - - 26-50%     The patient did well on individual subtest particularly for measures of verbal memory and learning with performances in the average range and the patient was able to retain information after period of delay.  The patient also performed well on recognition and cueing format suggesting that if information is initially stored and organized remains available for later retrieval and recall.  The patient showed greater difficulties with visual memory versus auditory memory although not significant deficits or severe deficits.  This overall pattern suggest that left hemisphere memory functions are performing better than primary right  hemisphere types functions.  The patient also did generally well on visual encoding task versus auditory encoding task and it is likely that the primary memory deficits identified as subjective symptoms are primarily related to difficulties with auditory encoding rather than an inability to store, organize or retrieve information.  This is likely to produce significant forgetfulness and inability to remember basic day-to-day activities but still be able to learn and remember more significant events.   ABILITY-MEMORY ANALYSIS  Ability Score:    GAI: 82 Date of Testing:  WAIS-IV; WMS-IV 2020/07/25           Predicted Difference Method   Index Predicted WMS-IV Index Score Actual WMS-IV Index Score Difference Critical Value  Significant Difference Y/N Base Rate  Auditory Memory 90 93 -3 9.33 N   Visual Memory 89 84 5 7.72 N   Immediate Memory 88 85 3 10.41 N   Delayed Memory 90 91 -1 10.86 N   Statistical significance (critical value) at the .01 level.    I also calculated the patient's ability-memory analysis was utilized as the patient's global functioning index (general abilities index) as a comparison point to make predictions as to the patient's expected memory functions.  This predicted measure is then compared against actual performance.  The patient performed very consistent with predicted levels across the board and there was no significant difference between predicted versus achieved levels on memory domains including auditory and visual memory and immediate and delayed memory functions.  Summary of Results:   Overall, the results of the objective neuropsychological assessment do suggest some general reduction in overall cognitive functioning and cognitive efficiency.  There appear to be more difficulties with visual memory versus auditory memory but neither one of them were severely impaired.  Most of the auditory memory and learning deficits are likely due to  primary deficits for auditory encoding versus an inability to store, organize or retrieve auditory information.  Visual encoding measures were within normal limits as well as the patient's overall visual scanning, visual searching and speed of mental operations.  Visual constructional abilities were also within normal limits and the patient's visual analysis and organizational abilities were within normal limits.  The patient did have difficulties with visual reasoning and problem solving and visual estimation abilities.  Impression/Diagnosis:   Overall, the results of the current neuropsychological evaluation are quite encouraging with regards to concerns around potential progressive loss of function.  While the patient did show some specific cognitive deficits they appear primarily related to auditory encoding deficits and some very focal right hemisphere and more specifically right frontal impact.  Visual reasoning and problem solving abilities and visual judgment and estimation abilities did show significant impairments in visual memory and learning was below her auditory memory and learning.  However, overall memory functions appear to be fairly well-preserved and auditory learning showed clear indications of the ability to store, organize and retrieve new information although the initial auditory encoding components were her primary area of difficulties.  These auditory encoding difficulties are likely one of the primary culprits behind the observed subjective symptoms by the patient and her daughter and typically lead to forgetfulness for rather mundane day-to-day activities such as where objects are put another pieces of information that do not require any significant time to attend to.  The patient does appear to be able to learn new information and retain that information over period of delay.  Visual constructional abilities and visual analysis and organization appears to be intact and the patient has  good overall information processing speed etc.  As far as diagnostic considerations, the pattern of results combined with subjective symptoms reported by the patient family and available medical information including MRI and PET scan do not suggest patterns typical of any type of progressive cortical dementia and there are no indications of subcortical type of loss of function.  The patient did have 1 area of focal involvement identified with her MRI scan and the right frontal area with a small 5 mm lesion noted of unknown etiology.  The patient does have a history of reported TIA in 2006 with symptoms at that time consistent with right frontal involvement possibly but other potential etiologies of this focal lesion were postulated during the interpretation of the MRI results.  The patient's cognitive strengths and weaknesses do suggest that there are some reduction in overall functioning primarily related to auditory encoding but overall information processing speed and overall memory do not show any significant pattern of severe cognitive loss.  I do think that mild impacts of her previous cerebrovascular event and potential vocal process going on in the right frontal lobe combined with reduction in auditory encoding are the culprits behind subjective experience and memory loss.  The pattern is not consistent with condition such as Alzheimer's, Lewy body, widespread severe cerebrovascular dementia or other processes.  As we may be picking up some of the symptoms early we have scheduled follow-up neuropsychological assessment in 9 to 12 months.  I have instructed the patient's family that if there are no significant changes between now and the time for reassessment that we will cancel this assessment but if they observe any progressive decline we will follow-up as now we have some very strong baseline data for direct comparisons as to possible future decline if needed for reassessment as to etiological factors.   Again, while there is some age-related memory and cognitive changes there does not appear to be any particular consistent pattern of progressive dementia noted.  Acute cerebrovascular event and/or some focal process in the right frontal lobe are potential causative variables along with normal age-related changes.  Diagnosis:    Short-term memory loss  Anxiety  TIA (transient ischemic attack)   _____________________ Ilean Skill, Psy.D. Clinical Neuropsychologist

## 2020-08-15 IMAGING — MG DIGITAL SCREENING BILAT W/ TOMO W/ CAD
8 series · 8 of 24 positions shown · non-contrast
Comparison: Previous exam(s).

CLINICAL DATA: Screening.

EXAM:
DIGITAL SCREENING BILATERAL MAMMOGRAM WITH TOMO AND CAD

[R MLO synth-2D]
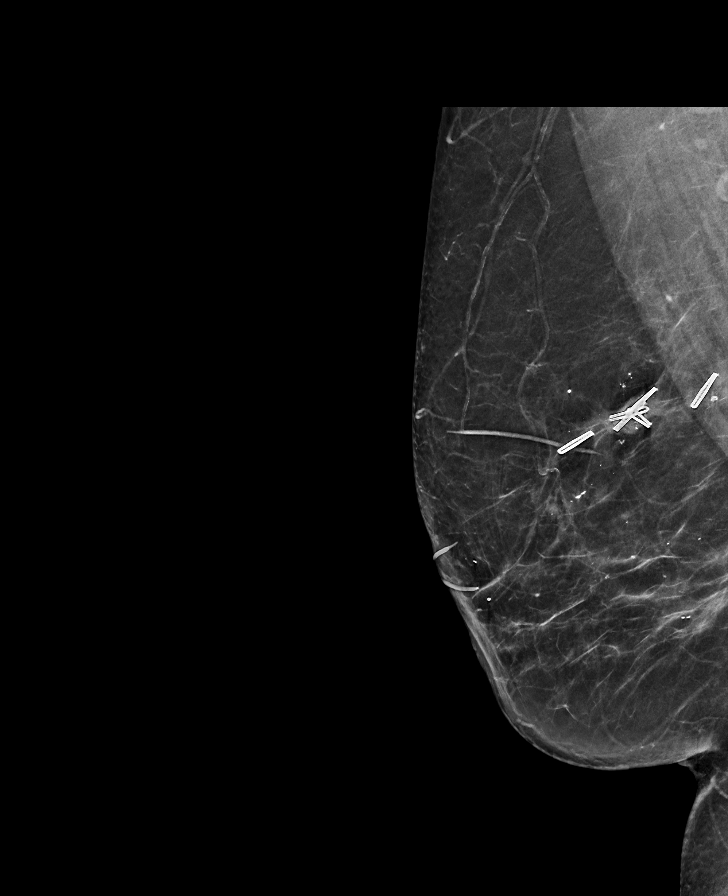

[L MLO synth-2D]
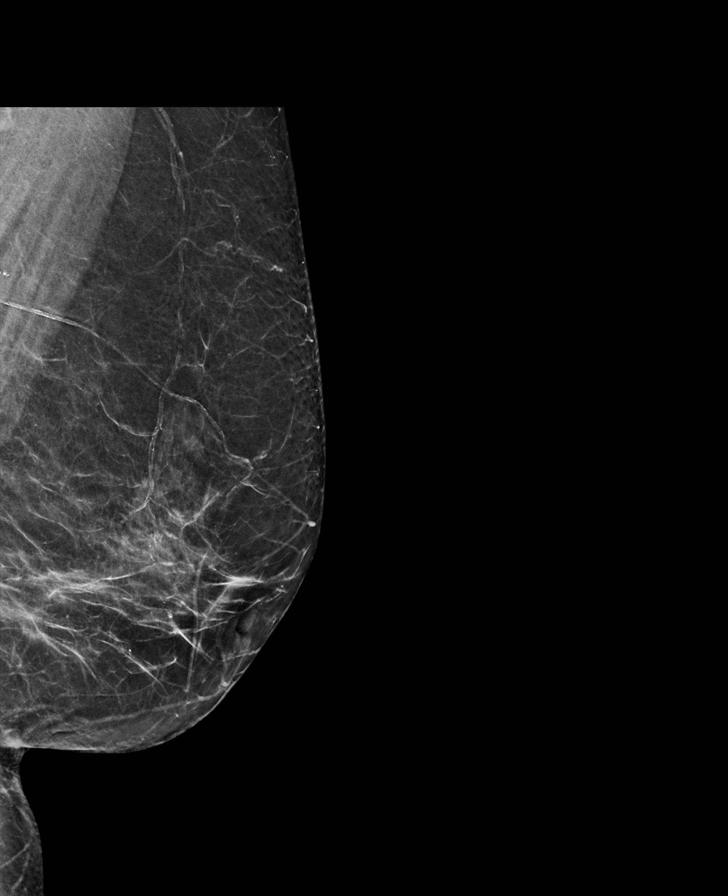

[L CC synth-2D]
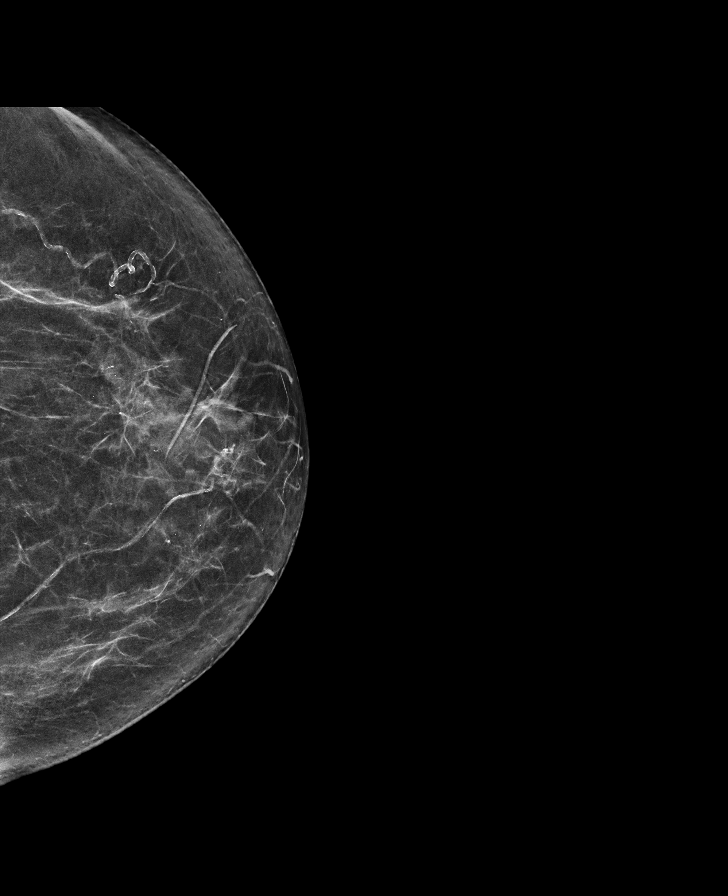

[R CC synth-2D]
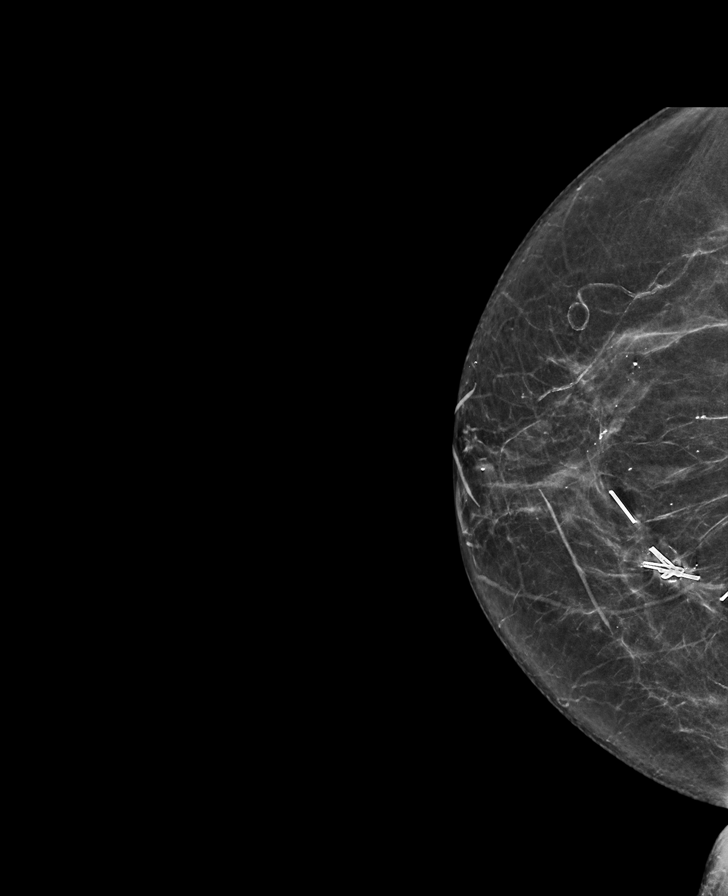

[R MLO tomo · tomo slice 35/68.0]
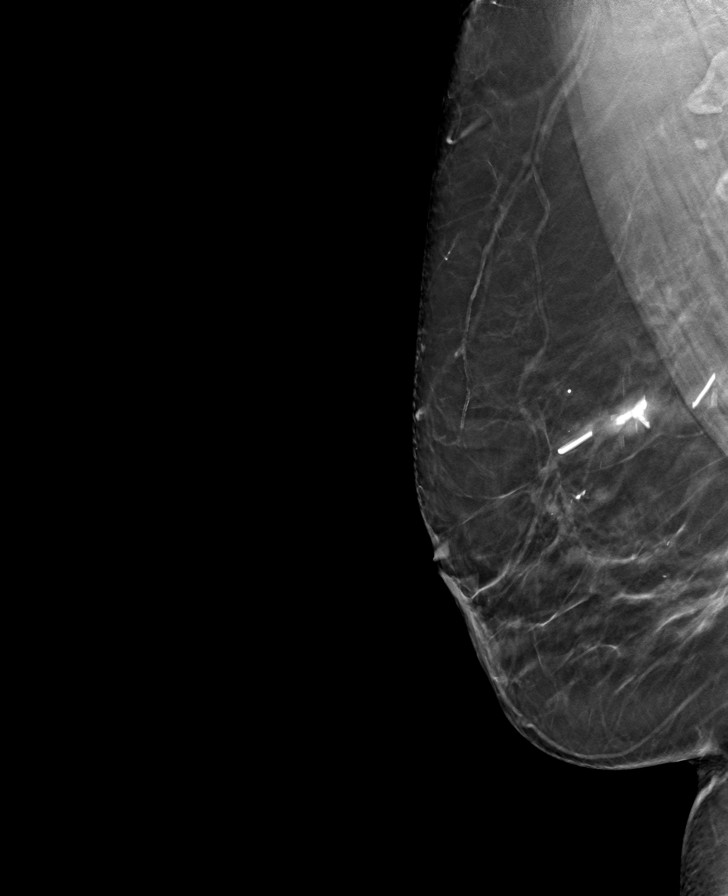

[L MLO tomo · tomo slice 35/68.0]
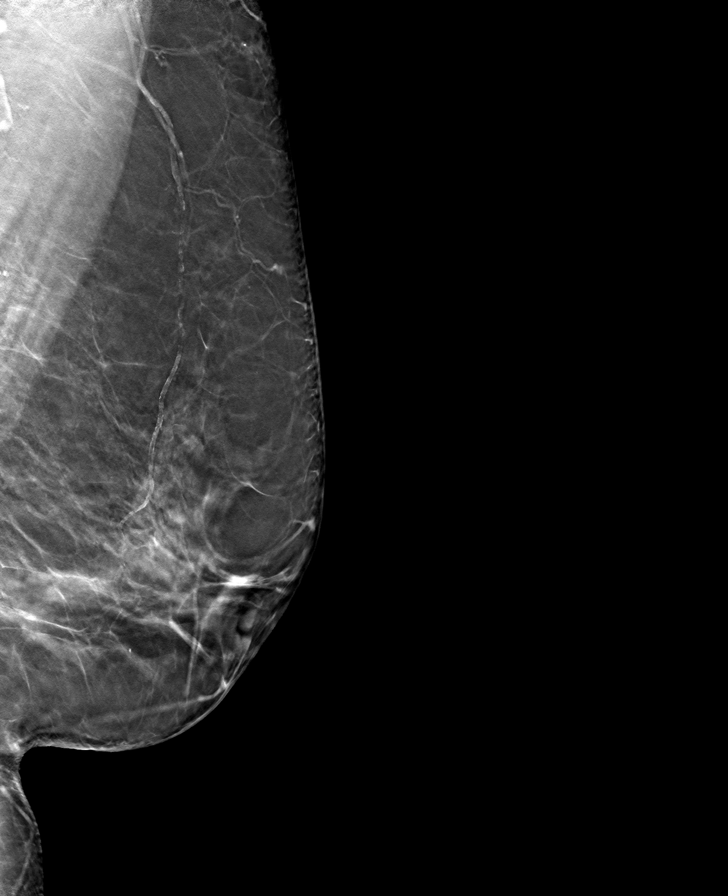

[L CC tomo · tomo slice 35/68.0]
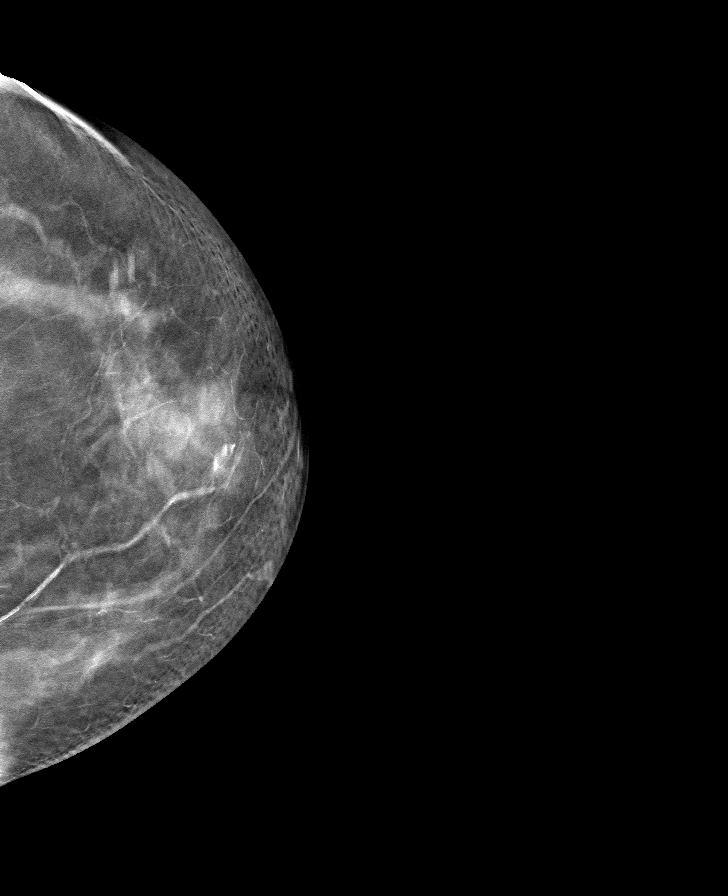

[R CC tomo · tomo slice 31/62.0]
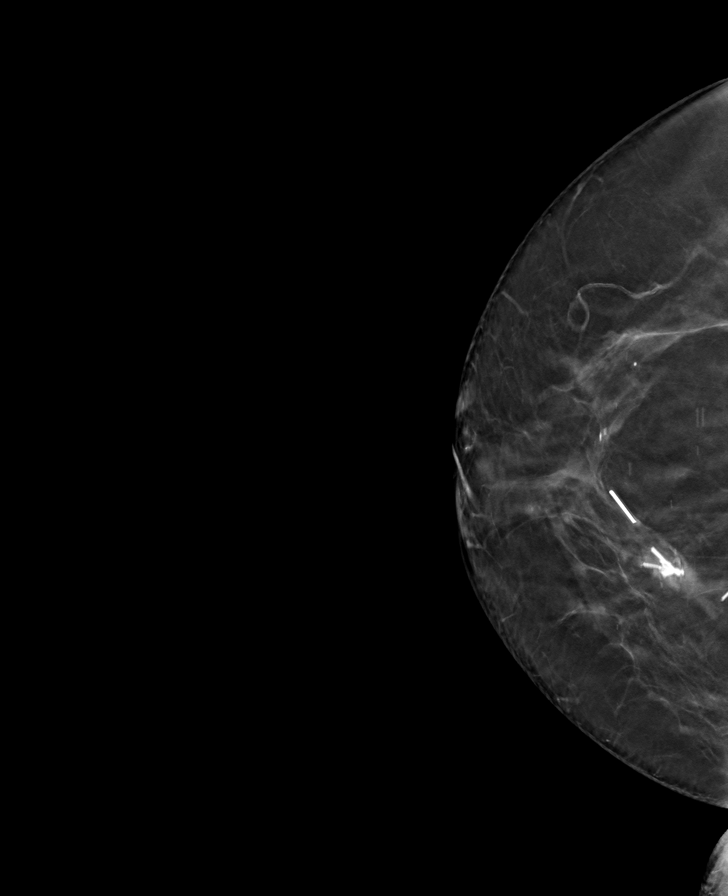

[8 of 24 positions shown; findings below may reference images not displayed]

ACR Breast Density Category b: There are scattered areas of
fibroglandular density.
FINDINGS: There are no findings suspicious for malignancy. Images were
processed with CAD.
IMPRESSION: No mammographic evidence of malignancy. A result letter of this
screening mammogram will be mailed directly to the patient.

RECOMMENDATION:
Screening mammogram in one year. (Code:[id number])

BI-RADS CATEGORY  1: Negative.

## 2020-08-30 ENCOUNTER — Encounter: Payer: Medicare Other | Attending: Psychology | Admitting: Psychology

## 2020-08-30 ENCOUNTER — Other Ambulatory Visit: Payer: Self-pay

## 2020-08-30 DIAGNOSIS — G459 Transient cerebral ischemic attack, unspecified: Secondary | ICD-10-CM | POA: Diagnosis not present

## 2020-08-30 DIAGNOSIS — I10 Essential (primary) hypertension: Secondary | ICD-10-CM | POA: Diagnosis not present

## 2020-08-30 DIAGNOSIS — Z8673 Personal history of transient ischemic attack (TIA), and cerebral infarction without residual deficits: Secondary | ICD-10-CM | POA: Diagnosis not present

## 2020-08-30 DIAGNOSIS — F419 Anxiety disorder, unspecified: Secondary | ICD-10-CM | POA: Diagnosis not present

## 2020-08-30 DIAGNOSIS — R413 Other amnesia: Secondary | ICD-10-CM | POA: Diagnosis not present

## 2020-08-30 DIAGNOSIS — Z853 Personal history of malignant neoplasm of breast: Secondary | ICD-10-CM | POA: Diagnosis not present

## 2020-09-01 ENCOUNTER — Ambulatory Visit: Payer: Medicare Other | Admitting: Psychology

## 2020-09-04 ENCOUNTER — Encounter: Payer: Self-pay | Admitting: Psychology

## 2020-09-04 NOTE — Progress Notes (Signed)
08/30/2020: 10 AM-11 AM:  Today's visit was an in person visit that was conducted in my outpatient clinic office.  Today we sat down with the patient and her family and reviewed the results of the recent neuropsychological evaluation.  I will include the summary and impressions below for convenience and the patient's complete neuropsychological evaluation can be found in the patient's EMR dated 07/27/2020 with the initial clinical interview and background history found in her EMR dated 06/14/2020.  The results of the current neuropsychological evaluation are quite encouraging without indication of patterns typical for progressive dementia type patterns and it does appear to be consistent with focal issues in the right frontal brain region without significant other cortical involvement.    Summary of Results:                        Overall, the results of the objective neuropsychological assessment do suggest some general reduction in overall cognitive functioning and cognitive efficiency.  There appear to be more difficulties with visual memory versus auditory memory but neither one of them were severely impaired.  Most of the auditory memory and learning deficits are likely due to primary deficits for auditory encoding versus an inability to store, organize or retrieve auditory information.  Visual encoding measures were within normal limits as well as the patient's overall visual scanning, visual searching and speed of mental operations.  Visual constructional abilities were also within normal limits and the patient's visual analysis and organizational abilities were within normal limits.  The patient did have difficulties with visual reasoning and problem solving and visual estimation abilities.  Impression/Diagnosis:                     Overall, the results of the current neuropsychological evaluation are quite encouraging with regards to concerns around potential progressive loss of function.  While the  patient did show some specific cognitive deficits they appear primarily related to auditory encoding deficits and some very focal right hemisphere and more specifically right frontal impact.  Visual reasoning and problem solving abilities and visual judgment and estimation abilities did show significant impairments in visual memory and learning was below her auditory memory and learning.  However, overall memory functions appear to be fairly well-preserved and auditory learning showed clear indications of the ability to store, organize and retrieve new information although the initial auditory encoding components were her primary area of difficulties.  These auditory encoding difficulties are likely one of the primary culprits behind the observed subjective symptoms by the patient and her daughter and typically lead to forgetfulness for rather mundane day-to-day activities such as where objects are put another pieces of information that do not require any significant time to attend to.  The patient does appear to be able to learn new information and retain that information over period of delay.  Visual constructional abilities and visual analysis and organization appears to be intact and the patient has good overall information processing speed etc.  As far as diagnostic considerations, the pattern of results combined with subjective symptoms reported by the patient family and available medical information including MRI and PET scan do not suggest patterns typical of any type of progressive cortical dementia and there are no indications of subcortical type of loss of function.  The patient did have 1 area of focal involvement identified with her MRI scan and the right frontal area with a small 5 mm lesion noted of unknown etiology.  The patient does have a history of reported TIA in 2006 with symptoms at that time consistent with right frontal involvement possibly but other potential etiologies of this focal  lesion were postulated during the interpretation of the MRI results.  The patient's cognitive strengths and weaknesses do suggest that there are some reduction in overall functioning primarily related to auditory encoding but overall information processing speed and overall memory do not show any significant pattern of severe cognitive loss.  I do think that mild impacts of her previous cerebrovascular event and potential vocal process going on in the right frontal lobe combined with reduction in auditory encoding are the culprits behind subjective experience and memory loss.  The pattern is not consistent with condition such as Alzheimer's, Lewy body, widespread severe cerebrovascular dementia or other processes.  As we may be picking up some of the symptoms early we have scheduled follow-up neuropsychological assessment in 9 to 12 months.  I have instructed the patient's family that if there are no significant changes between now and the time for reassessment that we will cancel this assessment but if they observe any progressive decline we will follow-up as now we have some very strong baseline data for direct comparisons as to possible future decline if needed for reassessment as to etiological factors.  Again, while there is some age-related memory and cognitive changes there does not appear to be any particular consistent pattern of progressive dementia noted.  Acute cerebrovascular event and/or some focal process in the right frontal lobe are potential causative variables along with normal age-related changes.  Diagnosis:                               Short-term memory loss  Anxiety  TIA (transient ischemic attack)   _____________________ Ilean Skill, Psy.D. Clinical Neuropsychologist

## 2020-09-06 ENCOUNTER — Encounter: Payer: Medicare Other | Admitting: Psychology

## 2020-09-26 ENCOUNTER — Encounter: Payer: Self-pay | Admitting: Neurology

## 2020-09-26 ENCOUNTER — Ambulatory Visit (INDEPENDENT_AMBULATORY_CARE_PROVIDER_SITE_OTHER): Payer: Medicare Other | Admitting: Neurology

## 2020-09-26 VITALS — BP 124/71 | HR 74 | Ht 62.0 in | Wt 189.0 lb

## 2020-09-26 DIAGNOSIS — G3184 Mild cognitive impairment, so stated: Secondary | ICD-10-CM | POA: Diagnosis not present

## 2020-09-26 NOTE — Patient Instructions (Signed)
Healthy Brain Study at [place]  Recommendations to prevent or slow progression of cognitive decline:   Exercise You should increase exercise 30 to 45 minutes per day at least 3 days a week although 5 to 7 would be preferred. Any type of exercise (including walking) is acceptable although a recumbent bicycle may be best if you are unsteady. Disease related apathy can be a significant roadblock to exercise and the only way to overcome this is to make it a daily routine and perhaps have a reward at the end (something your loved one loves to eat or drink perhaps) or a personal trainer coming to the home can also be very useful. In general a structured, repetitive schedule is best.   Cardiovascular Health: You should optimize all cardiovascular risk factors (blood pressure, sugar, cholesterol) as vascular disease such as strokes and heart attacks can make memory problems much worse.   Diet: Eating a heart healthy (Mediterranean) diet is also a good idea; fish and poultry instead of red meat, nuts (mostly non-peanuts), vegetables, fruits, olive oil or canola oil (instead of butter), minimal salt (use other spices to flavor foods), whole grain rice, bread, cereal and pasta and wine in moderation.  General Health: Any diseases which effect your body will effect your brain such as a pneumonia, urinary infection, blood clot, heart attack or stroke. Keep contact with your primary care doctor for regular follow ups.  Sleep. A good nights sleep is healthy for the brain. Seven hours is recommended. If you have insomnia or poor sleep habits see the recommendations below  Tips: Structured and consistent daytime and nighttime routine, including regular wake times, bedtimes, and mealtimes, will be important for the patient to avoid confusion. Keeping frequently used items in designated places will help reduce stress from searching. If there are worries about getting lost do not let the  patient leave home unaccompanied. They might benefit from wearing an identification bracelet that will help others assist in finding home if they become lost. Information about nationwide safe return services and other helpful resources may be obtained through the Alzheimer's Association helpline at [phone].  Finances, Power of Producer, television/film/video Directives: You should consider putting legal safeguards in place with regard to financial and medical decision making. While the spouse always has power of attorney for medical and financial issues in the absence of any form, you should consider what you want in case the spouse / caregiver is no longer around or capable of making decisions.   [place] : http://www.welch.com/.pdf  Or Google "[place]" AND "An Forensic scientist for [employer]  Other States: ApartmentMom.com.ee  The signature on these forms should be notarized.   DRIVING:   Driving only during the day Drive only to familiar Locations Avoid driving during bad weather  If you would like to be tested to see if you are driving safely, [facility] has a Clinical Driving Evaluation. To schedule an appointment call [phone].                RESOURCES:  Memory Loss: Improve your short term memory By Silvio Pate  The Alzheimer's Reading Room http://[email or web address]   The Alzheimer's Compendium http://www.alzcompend.info/  [employer] www.dukefamilysupport.BLT [phone]  Recommended resources for caregivers (All can be purchased on [employer]):  1) A Caregiver's Guide to Dementia: Using Activities and Other Strategies to Prevent, Reduce and Manage Behavioral Symptoms by Osie Bond. Gitlin and Barnetta Chapel Verrier Piersol   2) A [employer] to  Lewy Body Dementia by Caleen Essex MS BSN and Gaston Islam   3) What If It's Not Alzheimer's?: A Caregiver's Guide to Dementia by Koren Shiver (Author), Octaviano Batty (Editor)  3) The 36 hour day by Rabins and Mace  4) Understanding Difficult Behaviors by Merita Norton and White  Online course for helping caregivers reduce stress, guilt and frustration called the Caregivers Helpbook. The website is www.powerfultoolsforcaregivers.org  As a caregiver you are a Art gallery manager. Problems you face as a caregiver are usually unique to your situation and the way your loved-one's disease manifests itself. The best way to use these books is to look at the Table of Contents and read any chapters of interest or that apply to challenges you are having as a caregiver.  NATIONAL RESOURCES: For more information on neurological disorders or research programs funded by the [employer] of Neurological Disorders and Stroke, contact the Institute's Agricultural consultant (BRAIN) at: [place] [address] [phone] (toll-free) MasterBoxes.it  Information on dementia is also available from the following organizations: Alzheimer's Disease Education and Referral ([place]) [place] on A[facility] [address] [place] [address] [phone] (toll-free) DVDEnthusiasts.nl  Alzheimer's Association [address] [place] [address] [phone] (toll-free, 24-hour helpline) [phone] (TDD) CapitalMile.co.nz  Alzheimer's Foundation of America [address], [place] [address] [phone] (toll-free) www.alzfdn.org  [facility] [address] [place] [address] [phone] www.alzdiscovery.org  Association for [place] [address], [place] [address] [phone] (toll-free) [place] [address] ([phone])[phone] (toll-free) www.brightfocus.org/alzheimers  [facility] Stabler [facility] [address] [place] [address] [phone] www.https://lambert-jackson.net/  Lewy Body Dementia Association [address], [place] [address] [phone] (toll-free LBD Caregiver Link) www.lbda.[place], [place], [place] [address] [phone] (toll-free) [phone] [facility]) https://carter.com/  National Organization for Rare Disorders [address] [place] [address] 3-601-658-KIYJ [phone]) (toll-free) www.rarediseases.org  The Dementias: Hope Through Research was jointly produced by the [employer] of Neurological Disorders and Stroke (NINDS) and the [employer] on Aging (NIA), both part of the [employer], the [employer] research agency--supporting scientific studies that turn discovery into health. NINDS is the nation's leading funder of research on the brain and nervous system. The NINDS mission is to reduce the burden of neurological disease. For more information and resources, visit MasterBoxes.it [1] or call ([phone]. NIA leads the federal government effort conducting and supporting research on aging and the health and well-being of older people. NIA's Alzheimer's Disease Education and Referral (ADEAR) Center offers information and publications on dementia and caregiving for families, caregivers, and professionals. For more information, visit DVDEnthusiasts.nl [2] or call [phone]. Also available from NIA are publications and information about Alzheimer's disease as well as the booklets Frontotemporal Disorders: Information for Patients, Families, and Caregivers and Lewy Body Dementia: Information for Patients, Families, and  Professionals. Source URL: SocialSpecialists.co.nz

## 2020-09-26 NOTE — Progress Notes (Signed)
[facility]    Provider:  Dr Jaynee Eagles Requesting Provider: Suella Taylor Primary Care Provider:  Suella Broad, FNP  CC:  Memory impairment  Today we discussed findings on formal memory testing, not concerning for neurodegenerative disorder. We reviewed PET scan neg for alzheimer's, we reviewed MRI brain, one finding, repeat in 6 months, no new symptoms.  HPI:  Casey Taylor is a 85 y.o. female here as requested by Casey Taylor,* for memory loss. PMHx HT, OA, gout, anxiety, breast cancer stage 0 DCIS.  I reviewed Casey Taylor's notes, patient with increased forgetfulness, family becoming concerned about her memory impairment particularly short-term memory, concern is for dementia versus age-related changes;otherwise  no significant history of memory loss documented on notes provided by requesting provider or labs provided.  I did review epic notes, she was discharged from [facility] January 27, 2019 after operative treatment of primary localized Arnell Sieving arthritis of left knee with severe unremitting pain and had a total knee arthroplasty.  Patient appeared to do well without any problems and was discharged the next day.  I do not see any brain imaging ordered in the past.  I also reviewed "care everywhere" and found no prior labs, documents or imaging.She went to stay with her son and she was supposed to stay a month and she was upset like they left her there and for the family they think this is unusual. They were having a conversation at the table and she will keep asking the same questions. Her granddaughter and similar, Multiple family members have noticed.   She is here with her daughter who provides most information. Patient doesn't feel like she is having any memory concerns. She feels it is covid, she can't go out, she used to love to be social, and when covid came it "knocked you back", she can;t see her friends, she feels even  young people are depressed. She was extremely social and going to conventions. Daughter has told her mother she can't live in fear and she understands but she can't live in constant fear. She has been vaccinated. She works puzzles. She looks at magazines and word searches. Also sudukos and puzzles. She is doing things. She is getting older. Her mother had dementia. Lately they will tell her something and 5 minutes later she will ask again. Periods of forgetfullness. She moved from [place] and couldn't remember about her car and the things they were supposed to do with her car. They are alarmed by the memory loss, more short term. Mother had dementia.   Reviewed notes, labs and imaging from outside physicians, which showed: see above  Review of Systems: Patient complains of symptoms per HPI as well as the following symptoms: none . Pertinent negatives and positives per HPI. All others negative   Social History   Socioeconomic History  . Marital status: Divorced    Spouse name: Not on file  . Number of children: 5  . Years of education: Not on file  . Highest education level: Some college, no degree  Occupational History  . Not on file  Tobacco Use  . Smoking status: Never Smoker  . Smokeless tobacco: Never Used  Vaping Use  . Vaping Use: Never used  Substance and Sexual Activity  . Alcohol use: Yes    Alcohol/week: 1.0 standard drink    Types: 1 Glasses of wine per week    Comment: weekly  . Drug use: No  . Sexual activity: Not on file  Other Topics Concern  . Not on file  Social History Narrative   Lives at home alone    Caffeine: coffee maybe 1 cup daily   Right handed   Social Determinants of Health   Financial Resource Strain: Not on file  Food Insecurity: Not on file  Transportation Needs: Not on file  Physical Activity: Not on file  Stress: Not on file  Social Connections: Not on file  Intimate Partner Violence: Not on file    Family History  Problem Relation Age of  Onset  . Heart attack Mother   . Diabetes Mother   . Dementia Mother   . Heart attack Father   . Hypertension Sister   . Diabetes Sister   . Diabetes Brother   . Kidney disease Brother   . Esophageal cancer Brother   . Lung cancer Brother   . Prostate cancer Brother   . Diabetes Brother     Past Medical History:  Diagnosis Date  . Anxiety   . Arthritis   . Atypical chest pain 11/12/2017  . Breast cancer ([place])   . Essential hypertension 11/12/2017  . Gout   . Hypertension    dr Karlton Lemon  . Primary localized osteoarthritis of left knee 01/26/2019  . Stroke [facility])    tia    06    Patient Active Problem List   Diagnosis Date Noted  . Mild cognitive impairment 09/26/2020  . Short-term memory loss 02/29/2020  . FHx: dementia 02/29/2020  . Primary localized osteoarthritis of left knee 01/26/2019  . Anxiety   . Stroke ([place])   . Essential hypertension 11/12/2017  . Atypical chest pain 11/12/2017  . Gout 08/07/2013  . Bunion 08/07/2013  . Osteoarthritis (arthritis due to wear and tear of joints) 08/07/2013  . Breast cancer of upper-inner quadrant of right female breast ([place]) 04/16/2013    Past Surgical History:  Procedure Laterality Date  . ABDOMINAL HYSTERECTOMY    . BREAST BIOPSY Left 05/26/2013   Procedure: BREAST BIOPSY WITH NEEDLE LOCALIZATION;  Surgeon: Stark Klein, MD;  Location: [place];  Service: General;  Laterality: Left;  . BREAST LUMPECTOMY Right 2014  . CHOLECYSTECTOMY    . PARTIAL MASTECTOMY WITH NEEDLE LOCALIZATION Right 05/26/2013   Procedure: PARTIAL MASTECTOMY WITH NEEDLE LOCALIZATION;  Surgeon: Stark Klein, MD;  Location: [place];  Service: General;  Laterality: Right;  . TOTAL KNEE ARTHROPLASTY Left 01/26/2019   Procedure: TOTAL KNEE ARTHROPLASTY;  Surgeon: Elsie Saas, MD;  Location: [facility];  Service: Orthopedics;  Laterality: Left;    Current Outpatient Medications  Medication Sig Dispense Refill  . allopurinol (ZYLOPRIM) 100 MG tablet Take 1 tablet  (100 mg total) by mouth daily. 30 tablet 6  . chlorthalidone (HYGROTON) 25 MG tablet Take 25 mg by mouth daily. take with lots of water.    . Cholecalciferol (VITAMIN D PO) Take 2,000 Units by mouth daily.     . colchicine 0.6 MG tablet Take 1 tablet (0.6 mg total) by mouth daily as needed (gout). 30 tablet 1  . COVID-19 mRNA vaccine, Pfizer, 30 MCG/0.3ML injection INJECT AS DIRECTED .3 mL 0  . gabapentin (NEURONTIN) 300 MG capsule Take 1 capsule (300 mg total) by mouth 3 (three) times daily. 90 capsule 1  . losartan (COZAAR) 25 MG tablet Take 25 mg by mouth daily.    . Magnesium 250 MG TABS Take 250 mg by mouth.     . meloxicam (MOBIC) 15 MG tablet Take 15 mg by mouth daily.    Marland Kitchen  potassium gluconate 595 MG TABS tablet Take 595 mg by mouth daily.     No current facility-administered medications for this visit.    Allergies as of 09/26/2020 - Review Complete 09/26/2020  Allergen Reaction Noted  . Codeine Nausea Only 02/15/2012    Vitals: BP 124/71 (BP Location: Right Arm, Patient Position: Sitting)   Pulse 74   Ht 5\' 2"  (1.575 m)   Wt 189 lb (85.7 kg)   BMI 34.57 kg/m  Last Weight:  Wt Readings from Last 1 Encounters:  09/26/20 189 lb (85.7 kg)   Last Height:   Ht Readings from Last 1 Encounters:  09/26/20 5\' 2"  (1.575 m)     Physical exam:stable Exam: Gen: NAD, conversant, well nourised, obese, well groomed                     CV: RRR, no MRG. No Carotid Bruits. No peripheral edema, warm, nontender Eyes: Conjunctivae clear without exudates or hemorrhage  Neuro: stable Detailed Neurologic Exam  Speech:    Speech is normal; fluent and spontaneous with normal comprehension.  Cognition:    MMSE - Mini Mental State Exam 02/29/2020  Orientation to time 5  Orientation to Place 4  Registration 3  Attention/ Calculation 0  Recall 3  Language- name 2 objects 2  Language- repeat 1  Language- follow 3 step command 3  Language- read & follow direction 1  Write a sentence  1  Copy design 0  Total score 23    Cranial Nerves:    The pupils are equal, round, and reactive to light. Attempted fundoscopy could not visualize due to small pupils. Visual fields are full to finger confrontation. Extraocular movements are intact. Trigeminal sensation is intact and the muscles of mastication are normal. The face is symmetric. The palate elevates in the midline. Hearing intact. Voice is normal. Shoulder shrug is normal. The tongue has normal motion without fasciculations.   Coordination:    No dysmetria or ataxia  Gait:    not ataxic, not shuffling, adequate stride and arm swing for age but some low clearance and slight bradykinesia   Motor Observation:    No asymmetry, no atrophy, and no involuntary movements noted. Tone:    Normal muscle tone.    Posture:    Posture is normal. normal erect    Strength:    Strength is V/V in the upper and lower limbs.      Sensation: intact to LT     Reflex Exam:  DTR's:    Deep tendon reflexes in the upper and lower extremities are symmetrical bilaterally.   Toes:    The toes are downgoing bilaterally.   Clonus:    Clonus is absent.    Assessment/Plan:  85 y.o. female here as requested by Casey Taylor,* for memory loss. PMHx HT, OA, gout, anxiety, breast cancer stage 0 DCIS.Today we discussed findings on formal memory testing, not concerning for neurodegenerative disorder. We reviewed PET scan neg for alzheimer's, we reviewed MRI brain, one finding, repeat in 6 months, no new symptoms.  She declines any medication.  I recommend re-testing in 12-18 months  MRI brain (with and without) demonstrating:(personally reviewed images and also reviewed images with patient and daughter and answered all questions) - Small (60mm) right frontal juxtacortical T2 flair hyperintense lesion with subtle enhancement noted. No mass effect or parenchymal edema. Considerations include subacute autoimmune, inflammatory, vascular or  post-infectious etiologies.   PRIOR  MMSE 23 out of 30,  patient is here with daughter who provides most information, symptoms are concerning for mild cognitive impairment possibly early neurodegenerative disorder such as Alzheimer's. Patient does have a history of dementia in her family. We'll do a thorough evaluation including MRI of the brain, blood work, formal neuro cognitive testing, and FDG PET scan to differentiate between her types of dementia including frontotemporal and Lewy body due to some other symptoms that may be more lewy-body such as some Changes in thinking and reasoning as well as some loss of motivation, some mild gait issues.   MRI of the brain FDG PET Scan Neuropsych testing Labs Repeat MRI brain in August    No orders of the defined types were placed in this encounter.  No orders of the defined types were placed in this encounter.   Cc: Casey Dally, FNP  Sarina Ill, MD  [facility] [address] [place], [place] [address]  Phone [phone] Fax [phone]  I spent over over 30 minutes of face-to-face and non-face-to-face time with patient on the  1. Mild cognitive impairment    diagnosis.  This included previsit chart review, lab review, study review, order entry, electronic health record documentation, patient education on the different diagnostic and therapeutic options, counseling and coordination of care, risks and benefits of management, compliance, or risk factor reduction

## 2020-09-27 ENCOUNTER — Ambulatory Visit: Payer: Medicare Other | Attending: Internal Medicine

## 2020-09-27 DIAGNOSIS — Z23 Encounter for immunization: Secondary | ICD-10-CM

## 2020-09-27 NOTE — Progress Notes (Signed)
   Covid-19 Vaccination Clinic  Name:  Langston Summerfield Lensing    MRN: [id number] DOB: 1936-04-03  09/27/2020  Ms. Furno was observed post Covid-19 immunization for 15 minutes without incident. She was provided with Vaccine Information Sheet and instruction to access the V-Safe system.   Ms. Klecka was instructed to call 911 with any severe reactions post vaccine: Marland Kitchen Difficulty breathing  . Swelling of face and throat  . A fast heartbeat  . A bad rash all over body  . Dizziness and weakness   Immunizations Administered    Name Date Dose VIS Date Route   PFIZER Comrnaty(Gray TOP) Covid-19 Vaccine 09/27/2020 12:41 PM 0.3 mL 05/26/2020 Intramuscular   Manufacturer: [place]   Lot: [id number]   NDC: [phone]

## 2020-10-03 ENCOUNTER — Other Ambulatory Visit (HOSPITAL_BASED_OUTPATIENT_CLINIC_OR_DEPARTMENT_OTHER): Payer: Self-pay

## 2020-10-03 MED ORDER — PFIZER-BIONT COVID-19 VAC-TRIS 30 MCG/0.3ML IM SUSP
INTRAMUSCULAR | 0 refills | Status: DC
  Filled 2020-10-03: qty 0.3, 1d supply, fill #0

## 2020-11-28 DIAGNOSIS — L281 Prurigo nodularis: Secondary | ICD-10-CM | POA: Diagnosis not present

## 2020-11-28 DIAGNOSIS — L308 Other specified dermatitis: Secondary | ICD-10-CM | POA: Diagnosis not present

## 2020-11-28 DIAGNOSIS — L218 Other seborrheic dermatitis: Secondary | ICD-10-CM | POA: Diagnosis not present

## 2021-01-11 ENCOUNTER — Telehealth: Payer: Self-pay | Admitting: *Deleted

## 2021-01-11 ENCOUNTER — Telehealth: Payer: Self-pay | Admitting: Podiatry

## 2021-01-11 DIAGNOSIS — M79672 Pain in left foot: Secondary | ICD-10-CM | POA: Diagnosis not present

## 2021-01-11 NOTE — Telephone Encounter (Signed)
Please schedule for reevaluation, has not been seen since 02/2020.

## 2021-01-11 NOTE — Telephone Encounter (Signed)
Patient called and stated that she is having heel pain and wanted to know what she can take. Next avail appointment for Dr. Amalia Hailey would be Monday.  Please Advise

## 2021-01-16 ENCOUNTER — Other Ambulatory Visit: Payer: Self-pay

## 2021-01-16 ENCOUNTER — Ambulatory Visit (INDEPENDENT_AMBULATORY_CARE_PROVIDER_SITE_OTHER): Payer: Medicare Other | Admitting: Podiatry

## 2021-01-16 DIAGNOSIS — M722 Plantar fascial fibromatosis: Secondary | ICD-10-CM | POA: Diagnosis not present

## 2021-01-16 MED ORDER — COLCHICINE 0.6 MG PO TABS: 0.6000 mg | ORAL_TABLET | Freq: Every day | ORAL | 1 refills | Status: AC | PRN

## 2021-01-16 MED ORDER — BETAMETHASONE SOD PHOS & ACET 6 (3-3) MG/ML IJ SUSP
3.0000 mg | Freq: Once | INTRAMUSCULAR | Status: AC
  Administered 2021-01-16: 3 mg via INTRA_ARTICULAR

## 2021-01-16 NOTE — Progress Notes (Signed)
   Subjective: 85 y.o. female presenting today for an acute flareup of pain and tenderness to the left foot this been going on for the last week.  Patient denies a history of injury.  She states that she is concerned that possibly she has an acute gout flareup.  She presents for further treatment and evaluation currently she has not done anything for treatment   Past Medical History:  Diagnosis Date   Anxiety    Arthritis    Atypical chest pain 11/12/2017   Breast cancer ([place])    Essential hypertension 11/12/2017   Gout    Hypertension    dr shelton   Primary localized osteoarthritis of left knee 01/26/2019   Stroke [facility])    tia    06     Objective: Physical Exam General: The patient is alert and oriented x3 in no acute distress.  Dermatology: Skin is warm, dry and supple bilateral lower extremities. Negative for open lesions or macerations bilateral.   Vascular: Dorsalis Pedis and Posterior Tibial pulses palpable bilateral.  Capillary fill time is immediate to all digits.  Neurological: Epicritic and protective threshold intact bilateral.   Musculoskeletal: Tenderness to palpation to the plantar aspect of the left heel along the plantar fascia. All other joints range of motion within normal limits bilateral. Strength 5/5 in all groups bilateral.   Assessment: 1. Plantar fasciitis left foot 2.  History of gout left foot  Plan of Care:  1. Patient evaluated. Xrays reviewed.   2. Injection of 0.5cc Celestone soluspan injected into the left plantar fascia.  3.  Refill prescription for colchicine 0.6 mg daily as needed 4.  Return to clinic as needed   Edrick Kins, DPM [facility]  Dr. Edrick Kins, DPM    [address], [place] [address]                Office [phone]  Fax [phone]

## 2021-01-23 ENCOUNTER — Other Ambulatory Visit: Payer: Self-pay | Admitting: Neurology

## 2021-01-23 DIAGNOSIS — R9089 Other abnormal findings on diagnostic imaging of central nervous system: Secondary | ICD-10-CM

## 2021-01-23 DIAGNOSIS — G3184 Mild cognitive impairment, so stated: Secondary | ICD-10-CM

## 2021-01-24 ENCOUNTER — Telehealth: Payer: Self-pay | Admitting: Neurology

## 2021-01-24 NOTE — Telephone Encounter (Signed)
Medicare/bcbs fed order sent to [facility]. No auth require GI will reach out to the patient to schedule.

## 2021-02-08 DIAGNOSIS — H2512 Age-related nuclear cataract, left eye: Secondary | ICD-10-CM | POA: Diagnosis not present

## 2021-02-08 DIAGNOSIS — H43813 Vitreous degeneration, bilateral: Secondary | ICD-10-CM | POA: Diagnosis not present

## 2021-02-08 DIAGNOSIS — Z961 Presence of intraocular lens: Secondary | ICD-10-CM | POA: Diagnosis not present

## 2021-02-08 DIAGNOSIS — H04123 Dry eye syndrome of bilateral lacrimal glands: Secondary | ICD-10-CM | POA: Diagnosis not present

## 2021-02-08 DIAGNOSIS — H10413 Chronic giant papillary conjunctivitis, bilateral: Secondary | ICD-10-CM | POA: Diagnosis not present

## 2021-02-09 ENCOUNTER — Observation Stay (HOSPITAL_COMMUNITY)
Admission: EM | Admit: 2021-02-09 | Discharge: 2021-02-11 | Disposition: A | Discharge status: Home / Self Care | Payer: Medicare Other | Attending: Family Medicine | Admitting: Family Medicine

## 2021-02-09 ENCOUNTER — Emergency Department (HOSPITAL_COMMUNITY): Payer: Medicare Other

## 2021-02-09 ENCOUNTER — Other Ambulatory Visit: Payer: Self-pay

## 2021-02-09 DIAGNOSIS — R791 Abnormal coagulation profile: Secondary | ICD-10-CM | POA: Diagnosis not present

## 2021-02-09 DIAGNOSIS — Z853 Personal history of malignant neoplasm of breast: Secondary | ICD-10-CM | POA: Insufficient documentation

## 2021-02-09 DIAGNOSIS — R072 Precordial pain: Secondary | ICD-10-CM | POA: Diagnosis not present

## 2021-02-09 DIAGNOSIS — R079 Chest pain, unspecified: Secondary | ICD-10-CM | POA: Diagnosis not present

## 2021-02-09 DIAGNOSIS — J9811 Atelectasis: Secondary | ICD-10-CM | POA: Diagnosis not present

## 2021-02-09 DIAGNOSIS — I10 Essential (primary) hypertension: Secondary | ICD-10-CM | POA: Insufficient documentation

## 2021-02-09 DIAGNOSIS — R0902 Hypoxemia: Secondary | ICD-10-CM | POA: Diagnosis not present

## 2021-02-09 DIAGNOSIS — Z96652 Presence of left artificial knee joint: Secondary | ICD-10-CM | POA: Diagnosis not present

## 2021-02-09 DIAGNOSIS — Z79899 Other long term (current) drug therapy: Secondary | ICD-10-CM | POA: Insufficient documentation

## 2021-02-09 DIAGNOSIS — R0789 Other chest pain: Secondary | ICD-10-CM | POA: Diagnosis not present

## 2021-02-09 DIAGNOSIS — Z20822 Contact with and (suspected) exposure to covid-19: Secondary | ICD-10-CM | POA: Insufficient documentation

## 2021-02-09 DIAGNOSIS — R739 Hyperglycemia, unspecified: Secondary | ICD-10-CM | POA: Diagnosis not present

## 2021-02-09 DIAGNOSIS — R42 Dizziness and giddiness: Secondary | ICD-10-CM | POA: Insufficient documentation

## 2021-02-09 DIAGNOSIS — F0390 Unspecified dementia without behavioral disturbance: Secondary | ICD-10-CM | POA: Diagnosis not present

## 2021-02-09 DIAGNOSIS — I959 Hypotension, unspecified: Secondary | ICD-10-CM | POA: Diagnosis not present

## 2021-02-09 MED ORDER — PROCHLORPERAZINE EDISYLATE 10 MG/2ML IJ SOLN
5.0000 mg | Freq: Once | INTRAMUSCULAR | Status: AC
  Administered 2021-02-10: 5 mg via INTRAVENOUS
  Filled 2021-02-09: qty 2

## 2021-02-09 MED ORDER — LACTATED RINGERS IV BOLUS
500.0000 mL | Freq: Once | INTRAVENOUS | Status: AC
  Administered 2021-02-10: 500 mL via INTRAVENOUS

## 2021-02-09 MED ORDER — NITROGLYCERIN 0.4 MG SL SUBL: 0.4000 mg | SUBLINGUAL_TABLET | SUBLINGUAL | Status: DC | PRN

## 2021-02-09 NOTE — ED Provider Notes (Signed)
[facility] EMERGENCY DEPARTMENT Provider Note   CSN: [id number] Arrival date & time: 02/09/21  2230     History Chief Complaint  Patient presents with   Chest Pain    Casey Taylor is a 85 y.o. female.  85 year old female with a past medical history of TIA, hypertension, breast cancer who presents to the emergency department today secondary to chest pain.  Patient was in her normal state of health and then around 830 she noticed that she felt very lightheaded and nauseous.  She vomited once that was nonbloody nonbilious.  She called her daughter who called EMS.  Daughter stated the patient [employer] pass out and looked very unwell.  She is not diaphoretic.  She did complain of shortness of breath at that time.  On EMS arrival her vital signs were unremarkable.  Reportedly EKG was normal.  They gave her 2 nitroglycerin which helped her pain but has since returned.  They also gave her 324 of aspirin.  Brought here for further evaluation.  At this time patient states she still has some mild chest pain that radiates into her left arm.  She describes it as pressure.  No headaches, abdominal pain, back pain, diarrhea or constipation.  No urinary symptoms.  She has no paresthesias or weakness in her extremities.  She or her daughter did not notice any facial droop or slurred speech.  No cardiac history.   Chest Pain     Past Medical History:  Diagnosis Date   Anxiety    Arthritis    Atypical chest pain 11/12/2017   Breast cancer ([place])    Essential hypertension 11/12/2017   Gout    Hypertension    dr shelton   Primary localized osteoarthritis of left knee 01/26/2019   Stroke [facility])    tia    06    Patient Active Problem List   Diagnosis Date Noted   Chest pain 02/10/2021   Dizziness 02/10/2021   Mild cognitive impairment 09/26/2020   Short-term memory loss 02/29/2020   FHx: dementia 02/29/2020   Primary localized osteoarthritis of left knee 01/26/2019    Anxiety    Stroke ([place])    Essential hypertension 11/12/2017   Atypical chest pain 11/12/2017   Gout 08/07/2013   Bunion 08/07/2013   Osteoarthritis (arthritis due to wear and tear of joints) 08/07/2013   Breast cancer of upper-inner quadrant of right female breast ([place]) 04/16/2013    Past Surgical History:  Procedure Laterality Date   ABDOMINAL HYSTERECTOMY     BREAST BIOPSY Left 05/26/2013   Procedure: BREAST BIOPSY WITH NEEDLE LOCALIZATION;  Surgeon: Stark Klein, MD;  Location: [place];  Service: General;  Laterality: Left;   BREAST LUMPECTOMY Right 2014   CHOLECYSTECTOMY     PARTIAL MASTECTOMY WITH NEEDLE LOCALIZATION Right 05/26/2013   Procedure: PARTIAL MASTECTOMY WITH NEEDLE LOCALIZATION;  Surgeon: Stark Klein, MD;  Location: [place];  Service: General;  Laterality: Right;   TOTAL KNEE ARTHROPLASTY Left 01/26/2019   Procedure: TOTAL KNEE ARTHROPLASTY;  Surgeon: Elsie Saas, MD;  Location: [facility];  Service: Orthopedics;  Laterality: Left;     OB History   No obstetric history on file.     Family History  Problem Relation Age of Onset   Heart attack Mother    Diabetes Mother    Dementia Mother    Heart attack Father    Hypertension Sister    Diabetes Sister    Diabetes Brother    Kidney disease Brother  Esophageal cancer Brother    Lung cancer Brother    Prostate cancer Brother    Diabetes Brother     Social History   Tobacco Use   Smoking status: Never   Smokeless tobacco: Never  Vaping Use   Vaping Use: Never used  Substance Use Topics   Alcohol use: Yes    Alcohol/week: 1.0 standard drink    Types: 1 Glasses of wine per week    Comment: weekly   Drug use: No    Home Medications Prior to Admission medications   Medication Sig Start Date End Date Taking? Authorizing Provider  allopurinol (ZYLOPRIM) 100 MG tablet Take 1 tablet (100 mg total) by mouth daily. 02/26/17  Yes Sheard, Myeong O, DPM  chlorthalidone (HYGROTON) 25 MG tablet Take 25 mg by  mouth daily. take with lots of water.   Yes [provider]  Cholecalciferol (VITAMIN D PO) Take 2,000 Units by mouth daily.    Yes [provider]  colchicine 0.6 MG tablet Take 1 tablet (0.6 mg total) by mouth daily as needed (gout). 01/16/21  Yes Edrick Kins, DPM  gabapentin (NEURONTIN) 300 MG capsule Take 1 capsule (300 mg total) by mouth 3 (three) times daily. 02/10/20  Yes Nicholas Lose, MD  losartan (COZAAR) 25 MG tablet Take 25 mg by mouth daily.   Yes [provider]  Magnesium 250 MG TABS Take 250 mg by mouth.    Yes [provider]  potassium gluconate 595 MG TABS tablet Take 595 mg by mouth daily.   Yes [provider]    Allergies    Codeine  Review of Systems   Review of Systems  Cardiovascular:  Positive for chest pain.  All other systems reviewed and are negative.  Physical Exam Updated Vital Signs BP (!) 146/72 (BP Location: Left Arm)   Pulse 63   Temp 98.1 F (36.7 C)   Resp 18   Ht '5\' 2"'$  (1.575 m)   Wt 87.2 kg   SpO2 97%   BMI 35.16 kg/m   Physical Exam Vitals and nursing note reviewed.  Constitutional:      Appearance: She is well-developed.  HENT:     Head: Normocephalic and atraumatic.     Nose: Nose normal. No congestion or rhinorrhea.  Eyes:     Pupils: Pupils are equal, round, and reactive to light.  Cardiovascular:     Rate and Rhythm: Normal rate and regular rhythm.  Pulmonary:     Effort: Pulmonary effort is normal. No respiratory distress.     Breath sounds: No stridor.  Chest:     Chest wall: No mass or deformity.  Abdominal:     General: There is no distension.     Palpations: Abdomen is soft.  Musculoskeletal:        General: Normal range of motion.     Cervical back: Normal range of motion.  Skin:    General: Skin is warm and dry.  Neurological:     General: No focal deficit present.     Mental Status: She is alert and oriented to person, place, and time.     Cranial Nerves: No  cranial nerve deficit.     Sensory: No sensory deficit.     Motor: No weakness.  Psychiatric:        Mood and Affect: Mood normal.    ED Results / Procedures / Treatments   Labs (all labs ordered are listed, but only abnormal results are displayed)  Labs Reviewed  COMPREHENSIVE METABOLIC PANEL - Abnormal; Notable for the following components:      Result Value   Glucose, Bld 138 (*)    Creatinine, Ser 1.08 (*)    GFR, Estimated 51 (*)    All other components within normal limits  D-DIMER, QUANTITATIVE - Abnormal; Notable for the following components:   D-Dimer, Quant 0.67 (*)    All other components within normal limits  BASIC METABOLIC PANEL - Abnormal; Notable for the following components:   Glucose, Bld 134 (*)    Creatinine, Ser 1.07 (*)    GFR, Estimated 52 (*)    All other components within normal limits  CBC - Abnormal; Notable for the following components:   RBC 3.70 (*)    Hemoglobin 11.6 (*)    HCT 35.6 (*)    All other components within normal limits  RESP PANEL BY RT-PCR (FLU A&B, COVID) ARPGX2  CBC WITH DIFFERENTIAL/PLATELET  LIPASE, BLOOD  TROPONIN I (HIGH SENSITIVITY)  TROPONIN I (HIGH SENSITIVITY)    EKG EKG Interpretation  Date/Time:  Thursday February 09 2021 22:33:34 EDT Ventricular Rate:  69 PR Interval:  200 QRS Duration: 88 QT Interval:  423 QTC Calculation: 454 R Axis:   44 Text Interpretation: Sinus rhythm Abnormal R-wave progression, early transition very similar to august 2020 Confirmed by Merrily Pew [phone]) on 02/09/2021 11:03:12 PM  Radiology CT HEAD WO CONTRAST (5MM)  Result Date: 02/10/2021 CLINICAL DATA:  Dizziness EXAM: CT HEAD WITHOUT CONTRAST TECHNIQUE: Contiguous axial images were obtained from the base of the skull through the vertex without intravenous contrast. COMPARISON:  03/25/2020 FINDINGS: Brain: No evidence of acute infarction, hemorrhage, hydrocephalus, extra-axial collection or mass lesion/mass effect. Mild atrophic  changes are noted. Vascular: No hyperdense vessel or unexpected calcification. Skull: Normal. Negative for fracture or focal lesion. Sinuses/Orbits: No acute finding. Other: None. IMPRESSION: Mild atrophic changes without acute intracranial abnormality. Electronically Signed   By: Inez Catalina M.D.   On: 02/10/2021 00:20   MR BRAIN WO CONTRAST  Result Date: 02/10/2021 CLINICAL DATA:  85 year old female with persistent dizziness. EXAM: MRI HEAD WITHOUT CONTRAST TECHNIQUE: Multiplanar, multiecho pulse sequences of the brain and surrounding structures were obtained without intravenous contrast. COMPARISON:  Brain MRI 03/01/2020.  Head CT 0016 hours today. FINDINGS: Brain: Cerebral volume appears stable since last year and normal for age. No restricted diffusion to suggest acute infarction. No midline shift, mass effect, evidence of mass lesion, ventriculomegaly, extra-axial collection or acute intracranial hemorrhage. Cervicomedullary junction and pituitary are within normal limits. [place] Cables and white matter signal is stable since 2021 and largely normal for age. There is a chronic microhemorrhage in the left periatrial white matter. But no other chronic cerebral blood products and no cortical encephalomalacia. Minimal white matter T2 and FLAIR hyperintensity. Deep gray matter nuclei, brainstem and cerebellum appear normal. Normal for age. Vascular: Major intracranial vascular flow voids are stable since last year, with dominant right and diminutive left distal vertebral arteries. Skull and upper cervical spine: Partially visible cervical spine disc and endplate degeneration with at least mild degenerative spinal stenosis at C3-C4 and C4-C5 on series 9, image 12. Visible bone marrow signal is stable and within normal limits. Sinuses/Orbits: Stable, negative. Other: Mastoids remain clear. Grossly normal visible internal auditory structures. Negative visible scalp and face soft tissues. IMPRESSION: 1. No acute  intracranial abnormality. Noncontrast MRI appearance of the brain is stable since 2021 and essentially normal for age. 2. Partially visible cervical spine degeneration with mild degenerative  spinal stenosis at C3-C4 and C4-C5. Electronically Signed   By: Genevie Ann M.D.   On: 02/10/2021 05:59   DG Chest Port 1 View  Result Date: 02/09/2021 CLINICAL DATA:  Sudden onset of chest pain. EXAM: PORTABLE CHEST 1 VIEW COMPARISON:  Chest radiograph 11/05/2017 FINDINGS: The cardiomediastinal contours are normal. Calcified mediastinal lymph nodes. Suspected mitral annulus calcifications. Subsegmental atelectasis at the left lung base. Mild streaky right perihilar scarring. Pulmonary vasculature is normal. No consolidation, pleural effusion, or pneumothorax. No acute osseous abnormalities are seen. Surgical clips project over the right chest wall/breast. IMPRESSION: Subsegmental left basilar atelectasis. Electronically Signed   By: Keith Rake M.D.   On: 02/09/2021 22:55    Procedures Procedures   Medications Ordered in ED Medications  nitroGLYCERIN (NITROSTAT) SL tablet 0.4 mg (has no administration in time range)  allopurinol (ZYLOPRIM) tablet 100 mg (has no administration in time range)  losartan (COZAAR) tablet 25 mg (has no administration in time range)  magnesium oxide (MAG-OX) tablet 200 mg (has no administration in time range)  enoxaparin (LOVENOX) injection 40 mg (has no administration in time range)  acetaminophen (TYLENOL) tablet 650 mg (has no administration in time range)    Or  acetaminophen (TYLENOL) suppository 650 mg (has no administration in time range)  hydrALAZINE (APRESOLINE) injection 5 mg (has no administration in time range)  gabapentin (NEURONTIN) capsule 300 mg (has no administration in time range)  prochlorperazine (COMPAZINE) injection 5 mg (5 mg Intravenous Given 02/10/21 0002)  lactated ringers bolus 500 mL (0 mLs Intravenous Stopped 02/10/21 0200)  lactated ringers bolus 500  mL (0 mLs Intravenous Stopped 02/10/21 0344)    ED Course  I have reviewed the triage vital signs and the nursing notes.  Pertinent labs & imaging results that were available during my care of the patient were reviewed by me and considered in my medical decision making (see chart for details).    MDM Rules/Calculators/A&P                         Major concern obviously would be for cardiac causes.  Consider possible neurologic etiology however without a headache or any other demonstrable neurologic symptoms with a CT head without contrast will suffice.  Low suspicion for pneumonia.  Will work on symptom control however patient seems to be at least moderate risk and will provide need to be observed in the hospital if not to b ve stressed. W/ cp/sob and dizziness will also get d dimer.   Heart score of 6-7, will plan for observation if symptoms resolve and troponins reassuring. Plan for cards consult if otherwise.   Troponins ok. Heart of 7. Will admit for obs for chest pain rule out.   Final Clinical Impression(s) / ED Diagnoses Final diagnoses:  Nonspecific chest pain    Rx / DC Orders ED Discharge Orders     None        Kieren Ricci, Corene Cornea, MD 02/10/21 [phone]

## 2021-02-09 NOTE — ED Triage Notes (Signed)
Pt BIB [facility] from home, c/o sudden onset chest pain that radiates to her left arm with nausea/vomiting and dizziness. Givenmg zofran, '324mg'$  asa, and 2 NTG, chest pain decreased after nitro. EMS VS: BP 116/59, HR 75

## 2021-02-10 ENCOUNTER — Observation Stay (HOSPITAL_COMMUNITY): Payer: Medicare Other

## 2021-02-10 ENCOUNTER — Emergency Department (HOSPITAL_COMMUNITY): Payer: Medicare Other

## 2021-02-10 ENCOUNTER — Encounter (HOSPITAL_COMMUNITY): Payer: Self-pay | Admitting: Internal Medicine

## 2021-02-10 ENCOUNTER — Ambulatory Visit (HOSPITAL_BASED_OUTPATIENT_CLINIC_OR_DEPARTMENT_OTHER): Payer: Medicare Other

## 2021-02-10 DIAGNOSIS — R42 Dizziness and giddiness: Secondary | ICD-10-CM | POA: Diagnosis not present

## 2021-02-10 DIAGNOSIS — R079 Chest pain, unspecified: Secondary | ICD-10-CM | POA: Diagnosis present

## 2021-02-10 DIAGNOSIS — R072 Precordial pain: Secondary | ICD-10-CM | POA: Diagnosis not present

## 2021-02-10 LAB — ECHOCARDIOGRAM COMPLETE
AR max vel: 3.02 cm2
AV Area VTI: 2.7 cm2
AV Area mean vel: 2.97 cm2
AV Mean grad: 5 mmHg
AV Peak grad: 9.4 mmHg
Ao pk vel: 1.53 m/s
Height: 62 in
S' Lateral: 2.3 cm
Single Plane A4C EF: 57.6 %
Weight: 3075.86 oz

## 2021-02-10 LAB — CBC
HCT: 35.6 % — ABNORMAL LOW (ref 36.0–46.0)
Hemoglobin: 11.6 g/dL — ABNORMAL LOW (ref 12.0–15.0)
MCH: 31.4 pg (ref 26.0–34.0)
MCHC: 32.6 g/dL (ref 30.0–36.0)
MCV: 96.2 fL (ref 80.0–100.0)
Platelets: 274 10*3/uL (ref 150–400)
RBC: 3.7 MIL/uL — ABNORMAL LOW (ref 3.87–5.11)
RDW: 12.5 % (ref 11.5–15.5)
WBC: 8.8 10*3/uL (ref 4.0–10.5)
nRBC: 0 % (ref 0.0–0.2)

## 2021-02-10 LAB — COMPREHENSIVE METABOLIC PANEL
ALT: 23 U/L (ref 0–44)
AST: 24 U/L (ref 15–41)
Albumin: 4 g/dL (ref 3.5–5.0)
Alkaline Phosphatase: 70 U/L (ref 38–126)
Anion gap: 10 (ref 5–15)
BUN: 21 mg/dL (ref 8–23)
CO2: 27 mmol/L (ref 22–32)
Calcium: 9.4 mg/dL (ref 8.9–10.3)
Chloride: 101 mmol/L (ref 98–111)
Creatinine, Ser: 1.08 mg/dL — ABNORMAL HIGH (ref 0.44–1.00)
GFR, Estimated: 51 mL/min — ABNORMAL LOW (ref >60)
Glucose, Bld: 138 mg/dL — ABNORMAL HIGH (ref 70–99)
Potassium: 3.5 mmol/L (ref 3.5–5.1)
Sodium: 138 mmol/L (ref 135–145)
Total Bilirubin: 0.7 mg/dL (ref 0.3–1.2)
Total Protein: 7.1 g/dL (ref 6.5–8.1)

## 2021-02-10 LAB — CBC WITH DIFFERENTIAL/PLATELET
Abs Immature Granulocytes: 0.03 10*3/uL (ref 0.00–0.07)
Basophils Absolute: 0.1 10*3/uL (ref 0.0–0.1)
Basophils Relative: 1 %
Eosinophils Absolute: 0.1 10*3/uL (ref 0.0–0.5)
Eosinophils Relative: 2 %
HCT: 38.8 % (ref 36.0–46.0)
Hemoglobin: 12.4 g/dL (ref 12.0–15.0)
Immature Granulocytes: 0 %
Lymphocytes Relative: 19 %
Lymphs Abs: 1.4 10*3/uL (ref 0.7–4.0)
MCH: 30.9 pg (ref 26.0–34.0)
MCHC: 32 g/dL (ref 30.0–36.0)
MCV: 96.8 fL (ref 80.0–100.0)
Monocytes Absolute: 0.4 10*3/uL (ref 0.1–1.0)
Monocytes Relative: 5 %
Neutro Abs: 5.5 10*3/uL (ref 1.7–7.7)
Neutrophils Relative %: 73 %
Platelets: 302 10*3/uL (ref 150–400)
RBC: 4.01 MIL/uL (ref 3.87–5.11)
RDW: 12.4 % (ref 11.5–15.5)
WBC: 7.5 10*3/uL (ref 4.0–10.5)
nRBC: 0 % (ref 0.0–0.2)

## 2021-02-10 LAB — D-DIMER, QUANTITATIVE: D-Dimer, Quant: 0.67 ug/mL-FEU — ABNORMAL HIGH (ref 0.00–0.50)

## 2021-02-10 LAB — LIPID PANEL
Cholesterol: 165 mg/dL (ref 0–200)
HDL: 39 mg/dL — ABNORMAL LOW (ref >40)
LDL Cholesterol: 105 mg/dL — ABNORMAL HIGH (ref 0–99)
Total CHOL/HDL Ratio: 4.2 RATIO
Triglycerides: 105 mg/dL (ref <150)
VLDL: 21 mg/dL (ref 0–40)

## 2021-02-10 LAB — BASIC METABOLIC PANEL
Anion gap: 8 (ref 5–15)
BUN: 19 mg/dL (ref 8–23)
CO2: 25 mmol/L (ref 22–32)
Calcium: 9.1 mg/dL (ref 8.9–10.3)
Chloride: 104 mmol/L (ref 98–111)
Creatinine, Ser: 1.07 mg/dL — ABNORMAL HIGH (ref 0.44–1.00)
GFR, Estimated: 52 mL/min — ABNORMAL LOW (ref >60)
Glucose, Bld: 134 mg/dL — ABNORMAL HIGH (ref 70–99)
Potassium: 3.9 mmol/L (ref 3.5–5.1)
Sodium: 137 mmol/L (ref 135–145)

## 2021-02-10 LAB — RESP PANEL BY RT-PCR (FLU A&B, COVID) ARPGX2
Influenza A by PCR: NEGATIVE
Influenza B by PCR: NEGATIVE
SARS Coronavirus 2 by RT PCR: NEGATIVE

## 2021-02-10 LAB — LIPASE, BLOOD: Lipase: 39 U/L (ref 11–51)

## 2021-02-10 LAB — TROPONIN I (HIGH SENSITIVITY)
Troponin I (High Sensitivity): 7 ng/L (ref <18)
Troponin I (High Sensitivity): 8 ng/L (ref <18)

## 2021-02-10 LAB — HEMOGLOBIN A1C
Hgb A1c MFr Bld: 5.3 % (ref 4.8–5.6)
Mean Plasma Glucose: 105.41 mg/dL

## 2021-02-10 MED ORDER — MAGNESIUM OXIDE -MG SUPPLEMENT 400 (240 MG) MG PO TABS
200.0000 mg | ORAL_TABLET | Freq: Every day | ORAL | Status: DC
  Administered 2021-02-10 – 2021-02-11 (×2): 200 mg via ORAL
  Filled 2021-02-10 (×2): qty 1

## 2021-02-10 MED ORDER — HYDRALAZINE HCL 20 MG/ML IJ SOLN: 5.0000 mg | INTRAMUSCULAR | Status: DC | PRN

## 2021-02-10 MED ORDER — ASPIRIN EC 81 MG PO TBEC
81.0000 mg | DELAYED_RELEASE_TABLET | Freq: Every day | ORAL | Status: DC
  Administered 2021-02-10 – 2021-02-11 (×2): 81 mg via ORAL
  Filled 2021-02-10 (×2): qty 1

## 2021-02-10 MED ORDER — LACTATED RINGERS IV BOLUS
500.0000 mL | Freq: Once | INTRAVENOUS | Status: AC
  Administered 2021-02-10: 500 mL via INTRAVENOUS

## 2021-02-10 MED ORDER — LOSARTAN POTASSIUM 25 MG PO TABS
25.0000 mg | ORAL_TABLET | Freq: Every day | ORAL | Status: DC
  Administered 2021-02-10 – 2021-02-11 (×2): 25 mg via ORAL
  Filled 2021-02-10 (×2): qty 1

## 2021-02-10 MED ORDER — GABAPENTIN 300 MG PO CAPS: 300.0000 mg | ORAL_CAPSULE | Freq: Three times a day (TID) | ORAL | Status: DC

## 2021-02-10 MED ORDER — ENOXAPARIN SODIUM 40 MG/0.4ML IJ SOSY
40.0000 mg | PREFILLED_SYRINGE | INTRAMUSCULAR | Status: DC
  Administered 2021-02-10: 40 mg via SUBCUTANEOUS
  Filled 2021-02-10 (×2): qty 0.4

## 2021-02-10 MED ORDER — ALLOPURINOL 100 MG PO TABS
100.0000 mg | ORAL_TABLET | Freq: Every day | ORAL | Status: DC
  Administered 2021-02-10 – 2021-02-11 (×2): 100 mg via ORAL
  Filled 2021-02-10 (×2): qty 1

## 2021-02-10 MED ORDER — POTASSIUM GLUCONATE 595 (99 K) MG PO TABS: 595.0000 mg | ORAL_TABLET | Freq: Every day | ORAL | Status: DC

## 2021-02-10 MED ORDER — ACETAMINOPHEN 650 MG RE SUPP: 650.0000 mg | Freq: Four times a day (QID) | RECTAL | Status: DC | PRN

## 2021-02-10 MED ORDER — GABAPENTIN 300 MG PO CAPS
300.0000 mg | ORAL_CAPSULE | Freq: Every day | ORAL | Status: DC
  Administered 2021-02-10 – 2021-02-11 (×2): 300 mg via ORAL
  Filled 2021-02-10 (×2): qty 1

## 2021-02-10 MED ORDER — ACETAMINOPHEN 325 MG PO TABS
650.0000 mg | ORAL_TABLET | Freq: Four times a day (QID) | ORAL | Status: DC | PRN
  Administered 2021-02-10: 650 mg via ORAL
  Filled 2021-02-10: qty 2

## 2021-02-10 NOTE — ED Notes (Signed)
Patient returned from [place] on hospital bed. Warm blankets given.

## 2021-02-10 NOTE — Consult Note (Addendum)
Cardiology Consultation:   Patient ID: Casey Taylor MRN: [id number]; DOB: 02/10/1936  Admit date: 02/09/2021 Date of Consult: 02/10/2021  PCP:  Suella Broad, FNP   [facility] Providers Cardiologist:  Oval Linsey '19  Patient Profile:   Casey Taylor is a 85 y.o. female with a hx of HTN, TIA, Breast CA s/p partial mastectomy who is being seen 02/10/2021 for the evaluation of chest pain at the request of Dr. Hal Hope.  History of Present Illness:   Casey Taylor is an 85 yo female with PMH noted above. She was evaluated by Dr. Oval Linsey back in 02/2018 for chest discomfort and shortness of breath. She underwent ETT with was negative for ischemia, just showing mildly impaired exercise tolerance.   She has been well since that time. Currently lives independently and cares for herself. Has never had an anginal symptoms with daily activity. She is limited in her physical activity from arthritis but other than that does well.   Presented to the ED On 8/25 with chest pain radiating into the left arm, nausea/vomiting and dizziness. Reports she was in her usual state of health until that evening. Had dinner with her daughter at the patient's house. Says she went to take a shower, got out and immediately felt dizzy, nauseated and vomited clear liquid. Felt extremely weak, called her daughter who came back to the house. Noted the patient was gray, diaphoretic and extremely weak. Called EMS. Daughter took her BP which was elevated in the [id number] systolic. States EMS had trouble getting her to the stretcher with her weakness. Patient also reports in the midst of other symptoms, she developed left arm pain radiating down into the hand and lateral chest. Was given SL nitro and ASA with EMS and did feel better.   Labs on admission showed stable electrolytes, Cr 1.08, WBC 7.5, Hgb 12.4, Ddimer 0.67, hsTn negative x2. CXR with subsegmental left basilar atelectasis. CT head without acute  changes. MRI with no acute findings. EKG showed SR 69 bpm, no acute ST/T wave changes. She was admitted to IM for further work up.    Past Medical History:  Diagnosis Date   Anxiety    Arthritis    Atypical chest pain 11/12/2017   Breast cancer ([place])    Essential hypertension 11/12/2017   Gout    Hypertension    dr shelton   Primary localized osteoarthritis of left knee 01/26/2019   Stroke [facility])    tia    06    Past Surgical History:  Procedure Laterality Date   ABDOMINAL HYSTERECTOMY     BREAST BIOPSY Left 05/26/2013   Procedure: BREAST BIOPSY WITH NEEDLE LOCALIZATION;  Surgeon: Stark Klein, MD;  Location: [place] [facility];  Service: General;  Laterality: Left;   BREAST LUMPECTOMY Right 2014   CHOLECYSTECTOMY     PARTIAL MASTECTOMY WITH NEEDLE LOCALIZATION Right 05/26/2013   Procedure: PARTIAL MASTECTOMY WITH NEEDLE LOCALIZATION;  Surgeon: Stark Klein, MD;  Location: [place];  Service: General;  Laterality: Right;   TOTAL KNEE ARTHROPLASTY Left 01/26/2019   Procedure: TOTAL KNEE ARTHROPLASTY;  Surgeon: Elsie Saas, MD;  Location: [facility];  Service: Orthopedics;  Laterality: Left;     Home Medications:  Prior to Admission medications   Medication Sig Start Date End Date Taking? Authorizing Provider  allopurinol (ZYLOPRIM) 100 MG tablet Take 1 tablet (100 mg total) by mouth daily. 02/26/17  Yes Sheard, Myeong O, DPM  chlorthalidone (HYGROTON) 25 MG tablet Take 25 mg by mouth daily. take with  lots of water.   Yes [provider]  Cholecalciferol (VITAMIN D PO) Take 2,000 Units by mouth daily.    Yes [provider]  colchicine 0.6 MG tablet Take 1 tablet (0.6 mg total) by mouth daily as needed (gout). 01/16/21  Yes Edrick Kins, DPM  gabapentin (NEURONTIN) 300 MG capsule Take 1 capsule (300 mg total) by mouth 3 (three) times daily. 02/10/20  Yes Nicholas Lose, MD  losartan (COZAAR) 25 MG tablet Take 25 mg by mouth daily.   Yes [provider]  Magnesium 250 MG TABS  Take 250 mg by mouth.    Yes [provider]  potassium gluconate 595 MG TABS tablet Take 595 mg by mouth daily.   Yes [provider]    Inpatient Medications: Scheduled Meds:  allopurinol  100 mg Oral Daily   aspirin EC  81 mg Oral Daily   enoxaparin (LOVENOX) injection  40 mg Subcutaneous Q24H   gabapentin  300 mg Oral Daily   losartan  25 mg Oral Daily   magnesium oxide  200 mg Oral Daily   Continuous Infusions:  PRN Meds: acetaminophen **OR** acetaminophen, nitroGLYCERIN  Allergies:    Allergies  Allergen Reactions   Codeine Nausea Only    Social History:   Social History   Socioeconomic History   Marital status: Divorced    Spouse name: Not on file   Number of children: 5   Years of education: Not on file   Highest education level: Some college, no degree  Occupational History   Not on file  Tobacco Use   Smoking status: Never   Smokeless tobacco: Never  Vaping Use   Vaping Use: Never used  Substance and Sexual Activity   Alcohol use: Yes    Alcohol/week: 1.0 standard drink    Types: 1 Glasses of wine per week    Comment: weekly   Drug use: No   Sexual activity: Not on file  Other Topics Concern   Not on file  Social History Narrative   Lives at home alone    Caffeine: coffee maybe 1 cup daily   Right handed   Social Determinants of Health   Financial Resource Strain: Not on file  Food Insecurity: Not on file  Transportation Needs: Not on file  Physical Activity: Not on file  Stress: Not on file  Social Connections: Not on file  Intimate Partner Violence: Not on file    Family History:    Family History  Problem Relation Age of Onset   Heart attack Mother    Diabetes Mother    Dementia Mother    Heart attack Father    Hypertension Sister    Diabetes Sister    Diabetes Brother    Kidney disease Brother    Esophageal cancer Brother    Lung cancer Brother    Prostate cancer Brother    Diabetes Brother      ROS:   Please see the history of present illness.   All other ROS reviewed and negative.     Physical Exam/Data:   Vitals:   02/10/21 0500 02/10/21 0612 02/10/21 0734 02/10/21 1155  BP: (!) 143/59 (!) 146/72 (!) 123/48 (!) 107/50  Pulse: (!) 55 63 60 66  Resp: '14 18 16 17  '$ Temp:  98.1 F (36.7 C) 97.9 F (36.6 C) 98.4 F (36.9 C)  TempSrc:   Oral Oral  SpO2: 97% 97% 98% 96%  Weight:  87.2 kg    Height:  $'5\' 2"'d$  (1.575 m)      Intake/Output Summary (Last 24 hours) at 02/10/2021 1302 Last data filed at 02/10/2021 1155 Gross per 24 hour  Intake --  Output 850 ml  Net -850 ml   Last 3 Weights 02/10/2021 09/26/2020 02/29/2020  Weight (lbs) 192 lb 3.9 oz 189 lb 189 lb  Weight (kg) 87.2 kg 85.73 kg 85.73 kg     Body mass index is 35.16 kg/m.  General:  Well nourished, well developed, in no acute distress HEENT: normal Neck: no JVD Vascular: No carotid bruits Cardiac:  normal S1, S2; RRR; no murmur  Lungs:  clear to auscultation bilaterally, no wheezing, rhonchi or rales  Abd: soft, nontender, no hepatomegaly  Ext: no edema Musculoskeletal:  No deformities, BUE and BLE strength normal and equal Skin: warm and dry  Neuro:  CNs 2-12 intact, no focal abnormalities noted Psych:  Normal affect   EKG:  The EKG was personally reviewed and demonstrates:  SR, 69 bpm poor R wave progression  Relevant CV Studies:  N/a   Laboratory Data:  High Sensitivity Troponin:   Recent Labs  Lab 02/09/21 2329 02/10/21 0045  TROPONINIHS 8 7     Chemistry Recent Labs  Lab 02/09/21 2329 02/10/21 0500  NA 138 137  K 3.5 3.9  CL 101 104  CO2 27 25  GLUCOSE 138* 134*  BUN 21 19  CREATININE 1.08* 1.07*  CALCIUM 9.4 9.1  GFRNONAA 51* 52*  ANIONGAP 10 8    Recent Labs  Lab 02/09/21 2329  PROT 7.1  ALBUMIN 4.0  AST 24  ALT 23  ALKPHOS 70  BILITOT 0.7   Hematology Recent Labs  Lab 02/09/21 2329 02/10/21 0500  WBC 7.5 8.8  RBC 4.01 3.70*  HGB 12.4 11.6*  HCT 38.8 35.6*  MCV  96.8 96.2  MCH 30.9 31.4  MCHC 32.0 32.6  RDW 12.4 12.5  PLT 302 274   BNPNo results for input(s): BNP, PROBNP in the last 168 hours.  DDimer  Recent Labs  Lab 02/09/21 2329  DDIMER 0.67*     Radiology/Studies:  CT HEAD WO CONTRAST (5MM)  Result Date: 02/10/2021 CLINICAL DATA:  Dizziness EXAM: CT HEAD WITHOUT CONTRAST TECHNIQUE: Contiguous axial images were obtained from the base of the skull through the vertex without intravenous contrast. COMPARISON:  03/25/2020 FINDINGS: Brain: No evidence of acute infarction, hemorrhage, hydrocephalus, extra-axial collection or mass lesion/mass effect. Mild atrophic changes are noted. Vascular: No hyperdense vessel or unexpected calcification. Skull: Normal. Negative for fracture or focal lesion. Sinuses/Orbits: No acute finding. Other: None. IMPRESSION: Mild atrophic changes without acute intracranial abnormality. Electronically Signed   By: Inez Catalina M.D.   On: 02/10/2021 00:20   MR BRAIN WO CONTRAST  Result Date: 02/10/2021 CLINICAL DATA:  85 year old female with persistent dizziness. EXAM: MRI HEAD WITHOUT CONTRAST TECHNIQUE: Multiplanar, multiecho pulse sequences of the brain and surrounding structures were obtained without intravenous contrast. COMPARISON:  Brain MRI 03/01/2020.  Head CT 0016 hours today. FINDINGS: Brain: Cerebral volume appears stable since last year and normal for age. No restricted diffusion to suggest acute infarction. No midline shift, mass effect, evidence of mass lesion, ventriculomegaly, extra-axial collection or acute intracranial hemorrhage. Cervicomedullary junction and pituitary are within normal limits. Pearline Cables and white matter signal is stable since 2021 and largely normal for age. There is a chronic microhemorrhage in the left periatrial white matter. But no other chronic cerebral blood products and no cortical encephalomalacia. Minimal white matter T2 and FLAIR hyperintensity.  Deep gray matter nuclei, brainstem and  cerebellum appear normal. Normal for age. Vascular: Major intracranial vascular flow voids are stable since last year, with dominant right and diminutive left distal vertebral arteries. Skull and upper cervical spine: Partially visible cervical spine disc and endplate degeneration with at least mild degenerative spinal stenosis at C3-C4 and C4-C5 on series 9, image 12. Visible bone marrow signal is stable and within normal limits. Sinuses/Orbits: Stable, negative. Other: Mastoids remain clear. Grossly normal visible internal auditory structures. Negative visible scalp and face soft tissues. IMPRESSION: 1. No acute intracranial abnormality. Noncontrast MRI appearance of the brain is stable since 2021 and essentially normal for age. 2. Partially visible cervical spine degeneration with mild degenerative spinal stenosis at C3-C4 and C4-C5. Electronically Signed   By: Genevie Ann M.D.   On: 02/10/2021 05:59   DG Chest Port 1 View  Result Date: 02/09/2021 CLINICAL DATA:  Sudden onset of chest pain. EXAM: PORTABLE CHEST 1 VIEW COMPARISON:  Chest radiograph 11/05/2017 FINDINGS: The cardiomediastinal contours are normal. Calcified mediastinal lymph nodes. Suspected mitral annulus calcifications. Subsegmental atelectasis at the left lung base. Mild streaky right perihilar scarring. Pulmonary vasculature is normal. No consolidation, pleural effusion, or pneumothorax. No acute osseous abnormalities are seen. Surgical clips project over the right chest wall/breast. IMPRESSION: Subsegmental left basilar atelectasis. Electronically Signed   By: Keith Rake M.D.   On: 02/09/2021 22:55     Assessment and Plan:   Casey Taylor is a 85 y.o. female with a hx of HTN, TIA, Breast CA s/p partial mastectomy who is being seen 02/10/2021 for the evaluation of chest pain at the request of Dr. Hal Hope.  Chest/arm discomfort/weakness: developed chest pain shortly after getting out of the shower with associated profound  weakness and nausea/vomiting. Given SL nitro with improvement in symptoms via EMS. hsTn negative x2, and EKG without ischemic changes. With symptoms seems reasonable to rule out possible cardiac etiology. Discussed lexiscan myoview with patient, will need to be done tomorrow vs outpatient -- NPO at midnight if remains inpatient -- check echo  Dizziness: denies any palpitations or fluttering. MRI was negative for acute findings. This was in the setting of getting out of the shower but BP was reportedly elevated in the 160s when checked.  -- orthostatics ordered but not completed yet  HTN: blood pressures have been stable.  -- continue losartan  -- hold chlorthalidone held on admission  Hx of TIA: many years ago, thinks back in 2006 -- CT head/MRI negative  Hx of Breast Ca  Risk Assessment/Risk Scores:   HEAR Score (for undifferentiated chest pain):  HEAR Score: 5{  For questions or updates, please contact [place] Please consult [email or web address] for contact info under    Signed, Reino Bellis, NP  02/10/2021 1:02 PM  Patient seen, examined. Available data reviewed. Agree with findings, assessment, and plan as outlined by Reino Bellis, NP.  The patient is independently interviewed and examined.  She is alert, oriented, elderly woman in no acute distress.  HEENT is normal, JVP is normal, carotid upstrokes normal without bruits, lungs are clear to auscultation bilaterally, heart is regular rate and rhythm no murmur gallop, abdomen soft nontender with no masses or organomegaly, extremities have trace left ankle edema otherwise clear, skin is warm and dry with no rash, neurologic is grossly intact with 5/5 strength bilaterally.  The patient's EKG shows normal sinus rhythm and has no acute ischemic changes.  Troponins are negative x2.  The patient's episode of nausea,  dizziness, clamminess, and left arm pain is concerning.  Total symptom duration was less than 1 hour.  The patient's  daughter, who is a CCU nurse, painted a concerning picture about the patient's appearance when she was experiencing the symptoms.  It seems appropriate to do stress testing for risk stratification to make sure that she does not have severe coronary artery disease as a potential etiology of her episode yesterday.  She prefers to do this as an inpatient and we will schedule a Lexiscan nuclear stress test tomorrow morning.  A 2D echocardiogram has been ordered.  Telemetry is reviewed and shows no arrhythmia.  As long as her stress test is low risk, I would not anticipate any further cardiac testing.  Sherren Mocha, M.D. 02/10/2021 2:48 PM

## 2021-02-10 NOTE — ED Notes (Signed)
Tele  Breakfast Ordered 

## 2021-02-10 NOTE — H&P (Signed)
History and Physical    Casey Taylor DOB: Oct 17, 1935 DOA: 02/09/2021  PCP: Suella Broad, FNP  Patient coming from: Home.  Chief Complaint: Dizziness and chest pain.  HPI: Casey Taylor is a 85 y.o. female with history of breast cancer in remission, hypertension presents to the ER after patient had dizziness and chest pain.  Patient's daughter stated that she was doing fine last evening and at around 8:30 PM after she had a bath when she was trying to get out of the bathroom she felt very dizzy nauseated and vomiting and she started developing some chest pressure retrosternal and pain radiating to her left arm.  The whole episode lasted from half an hour.  EMS was called and patient was given sublingual nitroglycerin following which the chest pain resolved.  The dizziness persisted.  Did not have any functional deficit of upper or lower extremities.  ED Course: In the ER patient CT head is unremarkable EKG shows normal sinus rhythm.  Labs show creatinine 1.08 LFTs are normal.  High sensitive troponins were negative.  CBC unremarkable chest x-ray nothing acute and D-dimer 0.67.  Patient admitted for further work-up of chest pain and dizziness.  On my exam patient still complains of dizziness.  Has good strength of the extremities no nystagmus.  COVID test pending.  Review of Systems: As per HPI, rest all negative.   Past Medical History:  Diagnosis Date   Anxiety    Arthritis    Atypical chest pain 11/12/2017   Breast cancer ([place])    Essential hypertension 11/12/2017   Gout    Hypertension    dr shelton   Primary localized osteoarthritis of left knee 01/26/2019   Stroke [facility])    tia    06    Past Surgical History:  Procedure Laterality Date   ABDOMINAL HYSTERECTOMY     BREAST BIOPSY Left 05/26/2013   Procedure: BREAST BIOPSY WITH NEEDLE LOCALIZATION;  Surgeon: Stark Klein, MD;  Location: [facility];  Service: General;  Laterality: Left;   BREAST  LUMPECTOMY Right 2014   CHOLECYSTECTOMY     PARTIAL MASTECTOMY WITH NEEDLE LOCALIZATION Right 05/26/2013   Procedure: PARTIAL MASTECTOMY WITH NEEDLE LOCALIZATION;  Surgeon: Stark Klein, MD;  Location: [facility];  Service: General;  Laterality: Right;   TOTAL KNEE ARTHROPLASTY Left 01/26/2019   Procedure: TOTAL KNEE ARTHROPLASTY;  Surgeon: Elsie Saas, MD;  Location: [facility];  Service: Orthopedics;  Laterality: Left;     reports that she has never smoked. She has never used smokeless tobacco. She reports current alcohol use of about 1.0 standard drink per week. She reports that she does not use drugs.  Allergies  Allergen Reactions   Codeine Nausea Only    Family History  Problem Relation Age of Onset   Heart attack Mother    Diabetes Mother    Dementia Mother    Heart attack Father    Hypertension Sister    Diabetes Sister    Diabetes Brother    Kidney disease Brother    Esophageal cancer Brother    Lung cancer Brother    Prostate cancer Brother    Diabetes Brother     Prior to Admission medications   Medication Sig Start Date End Date Taking? Authorizing Provider  allopurinol (ZYLOPRIM) 100 MG tablet Take 1 tablet (100 mg total) by mouth daily. 02/26/17  Yes Sheard, Myeong O, DPM  chlorthalidone (HYGROTON) 25 MG tablet Take 25 mg by mouth daily. take with lots  of water.   Yes [provider]  Cholecalciferol (VITAMIN D PO) Take 2,000 Units by mouth daily.    Yes [provider]  colchicine 0.6 MG tablet Take 1 tablet (0.6 mg total) by mouth daily as needed (gout). 01/16/21  Yes Edrick Kins, DPM  gabapentin (NEURONTIN) 300 MG capsule Take 1 capsule (300 mg total) by mouth 3 (three) times daily. 02/10/20  Yes Nicholas Lose, MD  losartan (COZAAR) 25 MG tablet Take 25 mg by mouth daily.   Yes [provider]  Magnesium 250 MG TABS Take 250 mg by mouth.    Yes [provider]  potassium gluconate 595 MG TABS tablet Take 595 mg by mouth daily.   Yes  [provider]    Physical Exam: Constitutional: Moderately built and nourished. Vitals:   02/10/21 0015 02/10/21 0030 02/10/21 0100 02/10/21 0130  BP: 129/89 (!) 124/55 127/64 125/61  Pulse: (!) 59 65 67 68  Resp: '15 16 14 12  '$ Temp:      TempSrc:      SpO2: 100% 97% 96% 97%   Eyes: Anicteric no pallor. ENMT: No discharge from the ears eyes nose and mouth. Neck: No mass felt.  No neck rigidity.   Respiratory: No rhonchi or crepitations. Cardiovascular: S1-S2 heard. Abdomen: Soft nontender bowel sound present. Musculoskeletal: No edema. Skin: No rash. Neurologic: Alert awake oriented to time place and person.  Moves all extremities 5 x 5.  No facial asymmetry tongue is midline pupils equal and reactive to light. Psychiatric: Appears normal.  Normal affect.   Labs on Admission: I have personally reviewed following labs and imaging studies  CBC: Recent Labs  Lab 02/09/21 2329  WBC 7.5  NEUTROABS 5.5  HGB 12.4  HCT 38.8  MCV 96.8  PLT 99991111   Basic Metabolic Panel: Recent Labs  Lab 02/09/21 2329  NA 138  K 3.5  CL 101  CO2 27  GLUCOSE 138*  BUN 21  CREATININE 1.08*  CALCIUM 9.4   GFR: CrCl cannot be calculated (Unknown ideal weight.). Liver Function Tests: Recent Labs  Lab 02/09/21 2329  AST 24  ALT 23  ALKPHOS 70  BILITOT 0.7  PROT 7.1  ALBUMIN 4.0   Recent Labs  Lab 02/09/21 2329  LIPASE 39   No results for input(s): AMMONIA in the last 168 hours. Coagulation Profile: No results for input(s): INR, PROTIME in the last 168 hours. Cardiac Enzymes: No results for input(s): CKTOTAL, CKMB, CKMBINDEX, TROPONINI in the last 168 hours. BNP (last 3 results) No results for input(s): PROBNP in the last 8760 hours. HbA1C: No results for input(s): HGBA1C in the last 72 hours. CBG: No results for input(s): GLUCAP in the last 168 hours. Lipid Profile: No results for input(s): CHOL, HDL, LDLCALC, TRIG, CHOLHDL, LDLDIRECT in the last 72  hours. Thyroid Function Tests: No results for input(s): TSH, T4TOTAL, FREET4, T3FREE, THYROIDAB in the last 72 hours. Anemia Panel: No results for input(s): VITAMINB12, FOLATE, FERRITIN, TIBC, IRON, RETICCTPCT in the last 72 hours. Urine analysis: No results found for: COLORURINE, APPEARANCEUR, LABSPEC, PHURINE, GLUCOSEU, HGBUR, BILIRUBINUR, KETONESUR, PROTEINUR, UROBILINOGEN, NITRITE, LEUKOCYTESUR Sepsis Labs: '@LABRCNTIP'$ (procalcitonin:4,lacticidven:4) )No results found for this or any previous visit (from the past 240 hour(s)).   Radiological Exams on Admission: CT HEAD WO CONTRAST (5MM)  Result Date: 02/10/2021 CLINICAL DATA:  Dizziness EXAM: CT HEAD WITHOUT CONTRAST TECHNIQUE: Contiguous axial images were obtained from the base of the skull through the vertex without intravenous contrast. COMPARISON:  03/25/2020  FINDINGS: Brain: No evidence of acute infarction, hemorrhage, hydrocephalus, extra-axial collection or mass lesion/mass effect. Mild atrophic changes are noted. Vascular: No hyperdense vessel or unexpected calcification. Skull: Normal. Negative for fracture or focal lesion. Sinuses/Orbits: No acute finding. Other: None. IMPRESSION: Mild atrophic changes without acute intracranial abnormality. Electronically Signed   By: Inez Catalina M.D.   On: 02/10/2021 00:20   DG Chest Port 1 View  Result Date: 02/09/2021 CLINICAL DATA:  Sudden onset of chest pain. EXAM: PORTABLE CHEST 1 VIEW COMPARISON:  Chest radiograph 11/05/2017 FINDINGS: The cardiomediastinal contours are normal. Calcified mediastinal lymph nodes. Suspected mitral annulus calcifications. Subsegmental atelectasis at the left lung base. Mild streaky right perihilar scarring. Pulmonary vasculature is normal. No consolidation, pleural effusion, or pneumothorax. No acute osseous abnormalities are seen. Surgical clips project over the right chest wall/breast. IMPRESSION: Subsegmental left basilar atelectasis. Electronically Signed   By:  Keith Rake M.D.   On: 02/09/2021 22:55    EKG: Independently reviewed.  Normal sinus rhythm.  Assessment/Plan Principal Problem:   Chest pain Active Problems:   Essential hypertension   Dizziness    Chest pain -cause not clear.  Happened after she had a vomiting episode.  Patient did have a stress test in 2019 which was negative for ischemia as per the report.  We will cycle cardiac markers consult cardiology.  Presently chest pain-free.  Aspirin. Dizziness still persist.  Given the patient had nausea vomiting with dizziness we will check an MRI of the brain.  Get orthostatics.  Further recommendation based on this. Hypertension on ARB.  Holding hydrochlorothiazide until we get orthostatics.  As needed IV hydralazine. History of breast cancer in remission.  COVID test pending.   DVT prophylaxis: Lovenox. Code Status: Full code. Family Communication: Patient's daughter. Disposition Plan: Home. Consults called: Cardiology. Admission status: Observation.   Rise Patience MD [facility] Pager [phone].  If 7PM-7AM, please contact night-coverage [email or web address] Password [facility]  02/10/2021, 2:38 AM

## 2021-02-10 NOTE — Progress Notes (Addendum)
PROGRESS NOTE    Casey Taylor  [id number] DOB: 07-05-1935 DOA: 02/09/2021 PCP: Suella Broad, FNP  Chief Complaint  Patient presents with   Chest Pain   Brief Narrative Casey Taylor is Casey Taylor 85 y.o. female with history of breast cancer in remission, hypertension presents to the ER after patient had dizziness and chest pain.   Assessment & Plan:   Principal Problem:   Chest pain Active Problems:   Essential hypertension   Dizziness  Chest pain: substernal, relieved by nitro, nausea as well.  Stress test from 2019 with mildly impaired exercise tolerance, no evidence for ischemia by ST segment analysis.   EKG without concerning ST-T wave changes.  Troponins negative x2. Continue aspirin Follow lipid panel, a1c Given concerning symptoms, cardiology c/s for additional recommendations  Vertigo MRI without concerning findings Will probably benefit from therapy evaluation/orthostatics when able Resolved at this time  Elevated D dimer 0.67, wnl when using age adjusted limits.  no worsening LE swelling, she's on RA without SOB.  No tachycardia.   Hypertension Continue losartan Holding HCTZ at this time  Hyperglycemia Elevated BG on metabolic panel, follow [name]  Hx Breast Cancer in Remission  DVT prophylaxis: lovenox Code Status: full  Family Communication: daughter at bedside Disposition:   Status is: Observation  The patient remains OBS appropriate and will d/c before 2 midnights.  Dispo: The patient is from: Home              Anticipated d/c is to: Home              Patient currently is not medically stable to d/c.   Difficult to place patient No       Consultants:  cardiology  Procedures:  none  Antimicrobials:  Anti-infectives (From admission, onward)    None          Subjective: Feeling better than yesterday  Objective: Vitals:   02/10/21 0400 02/10/21 0500 02/10/21 0612 02/10/21 0734  BP: 140/66 (!) 143/59 (!)  146/72 (!) 123/48  Pulse: 71 (!) 55 63 60  Resp: '14 14 18 16  '$ Temp:   98.1 F (36.7 C) 97.9 F (36.6 C)  TempSrc:    Oral  SpO2: 99% 97% 97% 98%  Weight:   87.2 kg   Height:   '5\' 2"'$  (1.575 m)    No intake or output data in the 24 hours ending 02/10/21 1031 Filed Weights   02/10/21 0612  Weight: 87.2 kg    Examination:  General exam: Appears calm and comfortable  Respiratory system: Clear to auscultation. Respiratory effort normal. Cardiovascular system: S1 & S2 heard, RRR.  Gastrointestinal system: Abdomen is nondistended, soft and nontender.  Central nervous system: Alert and oriented. No focal neurological deficits. Extremities: no LEE Skin: No rashes, lesions or ulcers Psychiatry: Judgement and insight appear normal. Mood & affect appropriate.     Data Reviewed: I have personally reviewed following labs and imaging studies  CBC: Recent Labs  Lab 02/09/21 2329 02/10/21 0500  WBC 7.5 8.8  NEUTROABS 5.5  --   HGB 12.4 11.6*  HCT 38.8 35.6*  MCV 96.8 96.2  PLT 302 [id number]    Basic Metabolic Panel: Recent Labs  Lab 02/09/21 2329 02/10/21 0500  NA 138 137  K 3.5 3.9  CL 101 104  CO2 27 25  GLUCOSE 138* 134*  BUN 21 19  CREATININE 1.08* 1.07*  CALCIUM 9.4 9.1    GFR: Estimated Creatinine Clearance: 41.5 mL/min ([name]) (by  C-G formula based on SCr of 1.07 mg/dL (H)).  Liver Function Tests: Recent Labs  Lab 02/09/21 2329  AST 24  ALT 23  ALKPHOS 70  BILITOT 0.7  PROT 7.1  ALBUMIN 4.0    CBG: No results for input(s): GLUCAP in the last 168 hours.   Recent Results (from the past 240 hour(s))  Resp Panel by RT-PCR (Flu [name]&B, Covid) Nasopharyngeal Swab     Status: None   Collection Time: 02/10/21  2:15 AM   Specimen: Nasopharyngeal Swab; Nasopharyngeal(NP) swabs in vial transport medium  Result Value Ref Range Status   SARS Coronavirus 2 by RT PCR NEGATIVE NEGATIVE Final    Comment: (NOTE) SARS-CoV-2 target nucleic acids are NOT DETECTED.  The  SARS-CoV-2 RNA is generally detectable in upper respiratory specimens during the acute phase of infection. The lowest concentration of SARS-CoV-2 viral copies this assay can detect is 138 copies/mL. [name] negative result does not preclude SARS-Cov-2 infection and should not be used as the sole basis for treatment or other patient management decisions. [name] negative result may occur with  improper specimen collection/handling, submission of specimen other than nasopharyngeal swab, presence of viral mutation(s) within the areas targeted by this assay, and inadequate number of viral copies(<138 copies/mL). [name] negative result must be combined with clinical observations, patient history, and epidemiological information. The expected result is Negative.  Fact Sheet for Patients:  EntrepreneurPulse.com.au  Fact Sheet for Healthcare Providers:  IncredibleEmployment.be  This test is no t yet approved or cleared by the [place] FDA and  has been authorized for detection and/or diagnosis of SARS-CoV-2 by FDA under an Emergency Use Authorization (EUA). This EUA will remain  in effect (meaning this test can be used) for the duration of the COVID-19 declaration under Section 564(b)(1) of the Act, 21 U.S.C.section 360bbb-3(b)(1), unless the authorization is terminated  or revoked sooner.       Influenza [name] by PCR NEGATIVE NEGATIVE Final   Influenza B by PCR NEGATIVE NEGATIVE Final    Comment: (NOTE) The Xpert Xpress SARS-CoV-2/FLU/RSV plus assay is intended as an aid in the diagnosis of influenza from Nasopharyngeal swab specimens and should not be used as [name] sole basis for treatment. Nasal washings and aspirates are unacceptable for Xpert Xpress SARS-CoV-2/FLU/RSV testing.  Fact Sheet for Patients: EntrepreneurPulse.com.au  Fact Sheet for Healthcare Providers: IncredibleEmployment.be  This test is not yet approved or  cleared by the [place] FDA and has been authorized for detection and/or diagnosis of SARS-CoV-2 by FDA under an Emergency Use Authorization (EUA). This EUA will remain in effect (meaning this test can be used) for the duration of the COVID-19 declaration under Section 564(b)(1) of the Act, 21 U.S.C. section 360bbb-3(b)(1), unless the authorization is terminated or revoked.  Performed at [facility], [place] [address] [place], [place] [address]          Radiology Studies: CT HEAD WO CONTRAST (5MM)  Result Date: 02/10/2021 CLINICAL DATA:  Dizziness EXAM: CT HEAD WITHOUT CONTRAST TECHNIQUE: Contiguous axial images were obtained from the base of the skull through the vertex without intravenous contrast. COMPARISON:  03/25/2020 FINDINGS: Brain: No evidence of acute infarction, hemorrhage, hydrocephalus, extra-axial collection or mass lesion/mass effect. Mild atrophic changes are noted. Vascular: No hyperdense vessel or unexpected calcification. Skull: Normal. Negative for fracture or focal lesion. Sinuses/Orbits: No acute finding. Other: None. IMPRESSION: Mild atrophic changes without acute intracranial abnormality. Electronically Signed   By: Inez Catalina M.D.   On: 02/10/2021 00:20  MR BRAIN WO CONTRAST  Result Date: 02/10/2021 CLINICAL DATA:  85 year old female with persistent dizziness. EXAM: MRI HEAD WITHOUT CONTRAST TECHNIQUE: Multiplanar, multiecho pulse sequences of the brain and surrounding structures were obtained without intravenous contrast. COMPARISON:  Brain MRI 03/01/2020.  Head CT 0016 hours today. FINDINGS: Brain: Cerebral volume appears stable since last year and normal for age. No restricted diffusion to suggest acute infarction. No midline shift, mass effect, evidence of mass lesion, ventriculomegaly, extra-axial collection or acute intracranial hemorrhage. Cervicomedullary junction and pituitary are within normal limits. Pearline Cables and white matter signal is stable since  2021 and largely normal for age. There is Mykira Hofmeister chronic microhemorrhage in the left periatrial white matter. But no other chronic cerebral blood products and no cortical encephalomalacia. Minimal white matter T2 and FLAIR hyperintensity. Deep gray matter nuclei, brainstem and cerebellum appear normal. Normal for age. Vascular: Major intracranial vascular flow voids are stable since last year, with dominant right and diminutive left distal vertebral arteries. Skull and upper cervical spine: Partially visible cervical spine disc and endplate degeneration with at least mild degenerative spinal stenosis at C3-C4 and C4-C5 on series 9, image 12. Visible bone marrow signal is stable and within normal limits. Sinuses/Orbits: Stable, negative. Other: Mastoids remain clear. Grossly normal visible internal auditory structures. Negative visible scalp and face soft tissues. IMPRESSION: 1. No acute intracranial abnormality. Noncontrast MRI appearance of the brain is stable since 2021 and essentially normal for age. 2. Partially visible cervical spine degeneration with mild degenerative spinal stenosis at C3-C4 and C4-C5. Electronically Signed   By: Genevie Ann M.D.   On: 02/10/2021 05:59   DG Chest Port 1 View  Result Date: 02/09/2021 CLINICAL DATA:  Sudden onset of chest pain. EXAM: PORTABLE CHEST 1 VIEW COMPARISON:  Chest radiograph 11/05/2017 FINDINGS: The cardiomediastinal contours are normal. Calcified mediastinal lymph nodes. Suspected mitral annulus calcifications. Subsegmental atelectasis at the left lung base. Mild streaky right perihilar scarring. Pulmonary vasculature is normal. No consolidation, pleural effusion, or pneumothorax. No acute osseous abnormalities are seen. Surgical clips project over the right chest wall/breast. IMPRESSION: Subsegmental left basilar atelectasis. Electronically Signed   By: Keith Rake M.D.   On: 02/09/2021 22:55        Scheduled Meds:  allopurinol  100 mg Oral Daily    enoxaparin (LOVENOX) injection  40 mg Subcutaneous Q24H   gabapentin  300 mg Oral Daily   losartan  25 mg Oral Daily   magnesium oxide  200 mg Oral Daily   Continuous Infusions:   LOS: 0 days    Time spent: over 30 min    Fayrene Helper, MD [facility]   To contact the attending provider between 7A-7P or the covering provider during after hours 7P-7A, please log into the web site [email or web address] and access using universal Morse password for that web site. If you do not have the password, please call the hospital operator.  02/10/2021, 10:31 AM

## 2021-02-10 NOTE — ED Notes (Signed)
Transported to MRI

## 2021-02-11 ENCOUNTER — Observation Stay (HOSPITAL_BASED_OUTPATIENT_CLINIC_OR_DEPARTMENT_OTHER): Payer: Medicare Other

## 2021-02-11 DIAGNOSIS — R079 Chest pain, unspecified: Secondary | ICD-10-CM

## 2021-02-11 DIAGNOSIS — R072 Precordial pain: Secondary | ICD-10-CM

## 2021-02-11 DIAGNOSIS — I10 Essential (primary) hypertension: Secondary | ICD-10-CM

## 2021-02-11 LAB — NM MYOCAR MULTI W/SPECT W/WALL MOTION / EF
Estimated workload: 1
Exercise duration (min): 5 min
Exercise duration (sec): 19 s
Peak HR: 85 {beats}/min
Rest HR: 59 {beats}/min
ST Depression (mm): 0 mm

## 2021-02-11 LAB — COMPREHENSIVE METABOLIC PANEL
ALT: 24 U/L (ref 0–44)
AST: 23 U/L (ref 15–41)
Albumin: 3.2 g/dL — ABNORMAL LOW (ref 3.5–5.0)
Alkaline Phosphatase: 66 U/L (ref 38–126)
Anion gap: 7 (ref 5–15)
BUN: 16 mg/dL (ref 8–23)
CO2: 27 mmol/L (ref 22–32)
Calcium: 8.9 mg/dL (ref 8.9–10.3)
Chloride: 101 mmol/L (ref 98–111)
Creatinine, Ser: 1.13 mg/dL — ABNORMAL HIGH (ref 0.44–1.00)
GFR, Estimated: 49 mL/min — ABNORMAL LOW (ref >60)
Glucose, Bld: 104 mg/dL — ABNORMAL HIGH (ref 70–99)
Potassium: 3.1 mmol/L — ABNORMAL LOW (ref 3.5–5.1)
Sodium: 135 mmol/L (ref 135–145)
Total Bilirubin: 0.7 mg/dL (ref 0.3–1.2)
Total Protein: 6 g/dL — ABNORMAL LOW (ref 6.5–8.1)

## 2021-02-11 LAB — CBC WITH DIFFERENTIAL/PLATELET
Abs Immature Granulocytes: 0.02 10*3/uL (ref 0.00–0.07)
Basophils Absolute: 0.1 10*3/uL (ref 0.0–0.1)
Basophils Relative: 1 %
Eosinophils Absolute: 0.3 10*3/uL (ref 0.0–0.5)
Eosinophils Relative: 5 %
HCT: 33.6 % — ABNORMAL LOW (ref 36.0–46.0)
Hemoglobin: 11 g/dL — ABNORMAL LOW (ref 12.0–15.0)
Immature Granulocytes: 0 %
Lymphocytes Relative: 40 %
Lymphs Abs: 2.4 10*3/uL (ref 0.7–4.0)
MCH: 30.8 pg (ref 26.0–34.0)
MCHC: 32.7 g/dL (ref 30.0–36.0)
MCV: 94.1 fL (ref 80.0–100.0)
Monocytes Absolute: 0.5 10*3/uL (ref 0.1–1.0)
Monocytes Relative: 8 %
Neutro Abs: 2.7 10*3/uL (ref 1.7–7.7)
Neutrophils Relative %: 46 %
Platelets: 273 10*3/uL (ref 150–400)
RBC: 3.57 MIL/uL — ABNORMAL LOW (ref 3.87–5.11)
RDW: 12.5 % (ref 11.5–15.5)
WBC: 6 10*3/uL (ref 4.0–10.5)
nRBC: 0 % (ref 0.0–0.2)

## 2021-02-11 LAB — TSH
TSH: 3.624 u[IU]/mL (ref 0.350–4.500)
TSH: 1.291 u[IU]/mL (ref 0.350–4.500)

## 2021-02-11 LAB — MAGNESIUM: Magnesium: 2 mg/dL (ref 1.7–2.4)

## 2021-02-11 LAB — PHOSPHORUS: Phosphorus: 3.9 mg/dL (ref 2.5–4.6)

## 2021-02-11 MED ORDER — REGADENOSON 0.4 MG/5ML IV SOLN
0.4000 mg | Freq: Once | INTRAVENOUS | Status: AC
  Filled 2021-02-11: qty 5

## 2021-02-11 MED ORDER — PRAVASTATIN SODIUM 40 MG PO TABS: 40.0000 mg | ORAL_TABLET | Freq: Every evening | ORAL | 1 refills | Status: DC

## 2021-02-11 MED ORDER — ASPIRIN 81 MG PO TBEC: 81.0000 mg | DELAYED_RELEASE_TABLET | Freq: Every day | ORAL | 1 refills | Status: AC

## 2021-02-11 MED ORDER — POTASSIUM CHLORIDE CRYS ER 20 MEQ PO TBCR
40.0000 meq | EXTENDED_RELEASE_TABLET | Freq: Once | ORAL | Status: AC
  Administered 2021-02-11: 40 meq via ORAL
  Filled 2021-02-11: qty 2

## 2021-02-11 MED ORDER — TECHNETIUM TC 99M TETROFOSMIN IV KIT
10.0000 | PACK | Freq: Once | INTRAVENOUS | Status: AC | PRN
  Administered 2021-02-11: 10 via INTRAVENOUS

## 2021-02-11 MED ORDER — TECHNETIUM TC 99M TETROFOSMIN IV KIT
30.2000 | PACK | Freq: Once | INTRAVENOUS | Status: AC | PRN
  Administered 2021-02-11: 30.2 via INTRAVENOUS

## 2021-02-11 MED ORDER — REGADENOSON 0.4 MG/5ML IV SOLN
INTRAVENOUS | Status: AC
  Administered 2021-02-11: 0.4 mg via INTRAVENOUS
  Filled 2021-02-11: qty 5

## 2021-02-11 NOTE — Progress Notes (Signed)
Casey Taylor presented for a nuclear stress test today.  No immediate complications.  Stress imaging is pending at this time.   Preliminary EKG findings may be listed in the chart, but the stress test result will not be finalized until perfusion imaging is complete.   [place], [place]  02/11/2021 9:33 AM

## 2021-02-11 NOTE — Discharge Summary (Signed)
Physician Discharge Summary  Casey Taylor [id number] DOB: 08-05-1935 DOA: 02/09/2021  PCP: Casey Broad, FNP  Admit date: 02/09/2021 Discharge date: 02/11/2021  Time spent: 40 minutes  Recommendations for Outpatient Follow-up:  Follow outpatient CBC/CMP Follow with cardiology outpatient  Follow dizziness/vertigo outpatient - resolved at time of discharge Follow blood pressure outpatient - discontinued HCTZ/K - needs follow up labs Follow up tolerance of statin Follow pending TSH  (wnl)  Discharge Diagnoses:  Principal Problem:   Chest pain Active Problems:   Essential hypertension   Dizziness   Discharge Condition: stable  Diet recommendation: heart healthy  Filed Weights   02/10/21 0612  Weight: 87.2 kg    History of present illness:  Casey Taylor is Casey Taylor 85 y.o. female with history of breast cancer in remission, hypertension presents to the ER after patient had dizziness and chest pain.  She ruled out for ACS.  Cardiology was c/s and Casey Taylor stress test was performed which was low risk.  MRI brain did not have any concerning findings.  Plan for discharge with outpatient follow up.  Hospital Course:  Chest pain: substernal, relieved by nitro, nausea as well.  Stress test from 2019 with mildly impaired exercise tolerance, no evidence for ischemia by ST segment analysis.   EKG without concerning ST-T wave changes.  Troponins negative x2. Continue aspirin Follow lipid panel 105, a1c wnl -> hx of TIA, start aspirin and statin at discharge  Cardiology recommending -- echo (EF 65%, no RWMA - consider limited echo for evaluation for LVOT obstruction and complete assessment of diastolic function - see report), stress test was low risk   LVOT color flow acceleration Defer to cardiology outpatient Follow TSH   Vertigo MRI without concerning findings Resolved, no dizziness today with working with therapy, follow outpatient    Elevated D dimer 0.67, wnl  when using age adjusted limits.  no worsening LE swelling (chronic LLE), she's on RA without SOB.  No tachycardia.    Hypertension Continue losartan Holding HCTZ at this time - continue to hold at discharge - hold potassium as well  Hyperglycemia Elevated BG on metabolic panel, follow [name]   Hx Breast Cancer in Remission  Procedures: Stress Test IMPRESSION: 1. No reversible ischemia or infarction.   2. Normal left ventricular wall motion.   3. Left ventricular ejection fraction 71%   4. Non invasive risk stratification*: Low   *2012 Appropriate Use Criteria for Coronary Revascularization Focused Update: J Am Coll Cardiol. [id number]. http://[email or web address].[email or web address]?articleid=[email or web address]  Echo IMPRESSIONS     1. Left ventricular ejection fraction, by estimation, is 65%. The left  ventricle has normal function. The left ventricle has no regional wall  motion abnormalities. There is moderate left ventricular hypertrophy with  midcavitary and LVOT color flow  acceleration. Doppler alignment suboptimal for assessment of LVOT or mid  cavitary obstruction. Left ventricular diastolic function was not fully  assessed.   2. Right ventricular systolic function is normal. The right ventricular  size is normal. There is normal pulmonary artery systolic pressure. The  estimated right ventricular systolic pressure is [name] mmHg.   3. The mitral valve is grossly normal. No evidence of mitral valve  regurgitation. No evidence of mitral stenosis.   4. The aortic valve is normal in structure. There is mild calcification  of the aortic valve. Aortic valve regurgitation is not visualized. No  aortic stenosis is present.   5. The inferior vena cava is normal in size with greater than 50%  respiratory variability,  suggesting right atrial pressure of 3 mmHg.   6. Increased flow velocities may be secondary to anemia, thyrotoxicosis,  hyperdynamic or high flow state.    Conclusion(s)/Recommendation(s): Consider limited echo for evaluation for  LVOT obstruction and complete assessment of diastolic function if  clinically indicated.   Consultations: cardiology  Discharge Exam: Vitals:   02/11/21 0932 02/11/21 1333  BP: 126/69 (!) 138/48  Pulse: 78 60  Resp:  16  Temp:  98.4 F (36.9 C)  SpO2:     Feels well, got up to go to bathroom, no LH, dizziness No CP, SOB LLE swelling is chronic  General: No acute distress. Cardiovascular: Heart sounds show Casey Taylor regular rate, and rhythm.  Lungs: Clear to auscultation bilaterally Abdomen: Soft, nontender, nondistended  Neurological: Alert and oriented 3. Moves all extremities 4. Cranial nerves II through XII grossly intact. Skin: Warm and dry. No rashes or lesions. Extremities: chronic LLE edema, stable per her report  Discharge Instructions   Discharge Instructions     Call MD for:  difficulty breathing, headache or visual disturbances   Complete by: As directed    Call MD for:  extreme fatigue   Complete by: As directed    Call MD for:  hives   Complete by: As directed    Call MD for:  persistant dizziness or light-headedness   Complete by: As directed    Call MD for:  persistant nausea and vomiting   Complete by: As directed    Call MD for:  redness, tenderness, or signs of infection (pain, swelling, redness, odor or green/yellow discharge around incision site)   Complete by: As directed    Call MD for:  severe uncontrolled pain   Complete by: As directed    Call MD for:  temperature >100.4   Complete by: As directed    Diet - low sodium heart healthy   Complete by: As directed    Discharge instructions   Complete by: As directed    You were seen for chest pain, nausea, vomiting, and dizziness.   You had [name] work up for cardiac cause of your symptoms.  This was reassuring with [name] negative stress test.   Stop your chlorthalidone and potassium.  Continue your losartan.  Please establish  with [name] PCP. Try to follow up in about [name] week for repeat labs and to repeat your blood pressure.    Follow up with your neurologist as scheduled.  Your MRI did not show any concerning findings which would explain your dizziness.  This has resolved at the time of discharge.   Follow up with cardiology as an outpatient.  Cardiology will follow your echo findings as an outpatient, they may repeat the study.  With your history of TIA, you've been started on aspirin and pravastatin (your LDL is elevated above goal).  Follow up your tolerance of pravastatin with your PCP.   Return for new, recurrent, or worsening symptoms.  Please ask your PCP to request records from this hospitalization so they know what was done and what the next steps will be.   Increase activity slowly   Complete by: As directed       Allergies as of 02/11/2021       Reactions   Codeine Nausea Only        Medication List     STOP taking these medications    chlorthalidone 25 MG tablet Commonly known as: HYGROTON   potassium gluconate 595 (99 K) MG Tabs tablet  TAKE these medications    allopurinol 100 MG tablet Commonly known as: ZYLOPRIM Take 1 tablet (100 mg total) by mouth daily.   aspirin 81 MG EC tablet Take 1 tablet (81 mg total) by mouth daily. Swallow whole. Start taking on: February 12, 2021   colchicine 0.6 MG tablet Take 1 tablet (0.6 mg total) by mouth daily as needed (gout).   gabapentin 300 MG capsule Commonly known as: NEURONTIN Take 1 capsule (300 mg total) by mouth 3 (three) times daily.   losartan 25 MG tablet Commonly known as: COZAAR Take 25 mg by mouth daily.   Magnesium 250 MG Tabs Take 250 mg by mouth.   pravastatin 40 MG tablet Commonly known as: Pravachol Take 1 tablet (40 mg total) by mouth every evening.   VITAMIN D PO Take 2,000 Units by mouth daily.       Allergies  Allergen Reactions   Codeine Nausea Only      The results of significant  diagnostics from this hospitalization (including imaging, microbiology, ancillary and laboratory) are listed below for reference.    Significant Diagnostic Studies: CT HEAD WO CONTRAST (5MM)  Result Date: 02/10/2021 CLINICAL DATA:  Dizziness EXAM: CT HEAD WITHOUT CONTRAST TECHNIQUE: Contiguous axial images were obtained from the base of the skull through the vertex without intravenous contrast. COMPARISON:  03/25/2020 FINDINGS: Brain: No evidence of acute infarction, hemorrhage, hydrocephalus, extra-axial collection or mass lesion/mass effect. Mild atrophic changes are noted. Vascular: No hyperdense vessel or unexpected calcification. Skull: Normal. Negative for fracture or focal lesion. Sinuses/Orbits: No acute finding. Other: None. IMPRESSION: Mild atrophic changes without acute intracranial abnormality. Electronically Signed   By: Inez Catalina M.D.   On: 02/10/2021 00:20   MR BRAIN WO CONTRAST  Result Date: 02/10/2021 CLINICAL DATA:  85 year old female with persistent dizziness. EXAM: MRI HEAD WITHOUT CONTRAST TECHNIQUE: Multiplanar, multiecho pulse sequences of the brain and surrounding structures were obtained without intravenous contrast. COMPARISON:  Brain MRI 03/01/2020.  Head CT 0016 hours today. FINDINGS: Brain: Cerebral volume appears stable since last year and normal for age. No restricted diffusion to suggest acute infarction. No midline shift, mass effect, evidence of mass lesion, ventriculomegaly, extra-axial collection or acute intracranial hemorrhage. Cervicomedullary junction and pituitary are within normal limits. Pearline Cables and white matter signal is stable since 2021 and largely normal for age. There is Reyaan Thoma chronic microhemorrhage in the left periatrial white matter. But no other chronic cerebral blood products and no cortical encephalomalacia. Minimal white matter T2 and FLAIR hyperintensity. Deep gray matter nuclei, brainstem and cerebellum appear normal. Normal for age. Vascular: Major  intracranial vascular flow voids are stable since last year, with dominant right and diminutive left distal vertebral arteries. Skull and upper cervical spine: Partially visible cervical spine disc and endplate degeneration with at least mild degenerative spinal stenosis at C3-C4 and C4-C5 on series 9, image 12. Visible bone marrow signal is stable and within normal limits. Sinuses/Orbits: Stable, negative. Other: Mastoids remain clear. Grossly normal visible internal auditory structures. Negative visible scalp and face soft tissues. IMPRESSION: 1. No acute intracranial abnormality. Noncontrast MRI appearance of the brain is stable since 2021 and essentially normal for age. 2. Partially visible cervical spine degeneration with mild degenerative spinal stenosis at C3-C4 and C4-C5. Electronically Signed   By: Genevie Ann M.D.   On: 02/10/2021 05:59   NM Myocar Multi W/Spect W/Wall Motion / EF  Result Date: 02/11/2021 CLINICAL DATA:  Chest pain. EXAM: MYOCARDIAL IMAGING WITH SPECT (REST AND PHARMACOLOGIC-STRESS) GATED  LEFT VENTRICULAR WALL MOTION STUDY LEFT VENTRICULAR EJECTION FRACTION TECHNIQUE: Standard myocardial SPECT imaging was performed after resting intravenous injection of 11 mCi Tc-36mtetrofosmin. Subsequently, intravenous infusion of Lexiscan was performed under the supervision of the Cardiology staff. At peak effect of the drug, 30.2 mCi Tc-953metrofosmin was injected intravenously and standard myocardial SPECT imaging was performed. Quantitative gated imaging was also performed to evaluate left ventricular wall motion, and estimate left ventricular ejection fraction. COMPARISON:  None. FINDINGS: Perfusion: No decreased activity in the left ventricle on stress imaging to suggest reversible ischemia or infarction. Wall Motion: Normal left ventricular wall motion. No left ventricular dilation. Left Ventricular Ejection Fraction: 71 % End diastolic volume 56 ml End systolic volume 16 ml IMPRESSION: 1. No  reversible ischemia or infarction. 2. Normal left ventricular wall motion. 3. Left ventricular ejection fraction 71% 4. Non invasive risk stratification*: Low *2012 Appropriate Use Criteria for Coronary Revascularization Focused Update: J Am Coll Cardiol. [id number] Electronically Signed   By: DaDorise BullionII M.D.   On: 02/11/2021 11:07   DG Chest Port 1 View  Result Date: 02/09/2021 CLINICAL DATA:  Sudden onset of chest pain. EXAM: PORTABLE CHEST 1 VIEW COMPARISON:  Chest radiograph 11/05/2017 FINDINGS: The cardiomediastinal contours are normal. Calcified mediastinal lymph nodes. Suspected mitral annulus calcifications. Subsegmental atelectasis at the left lung base. Mild streaky right perihilar scarring. Pulmonary vasculature is normal. No consolidation, pleural effusion, or pneumothorax. No acute osseous abnormalities are seen. Surgical clips project over the right chest wall/breast. IMPRESSION: Subsegmental left basilar atelectasis. Electronically Signed   By: MeKeith Rake.D.   On: 02/09/2021 22:55   ECHOCARDIOGRAM COMPLETE  Result Date: 02/10/2021    ECHOCARDIOGRAM REPORT   Patient Name:   Casey Taylor Date of Exam: 02/10/2021 Medical Rec #:  [id number]           Height:       62.0 in Accession #:    [id number]          Weight:       192.2 lb Date of Birth:  [birth year]          BSA:          1.880 m Patient Age:    [age]              BP:           108/69 mmHg Patient Gender: F                     HR:           60 bpm. Exam Location:  Inpatient Procedure: 2D Echo, Cardiac Doppler and Color Doppler Indications:   R07.9* Chest pain, unspecified  History:       Patient has no prior history of Echocardiogram examinations.                Angina.  Sonographer:   MiTawnya Crookeferring      31WitheeIMPRESSIONS  1. Left ventricular ejection fraction, by estimation, is 65%. The left ventricle  has normal function. The left ventricle has no regional wall motion abnormalities. There is moderate left ventricular hypertrophy with midcavitary and LVOT color flow acceleration. Doppler alignment suboptimal for assessment of LVOT or mid cavitary obstruction. Left ventricular diastolic function was not fully assessed.  2. Right ventricular systolic function is normal. The right ventricular size is normal. There is normal pulmonary artery  systolic pressure. The estimated right ventricular systolic pressure is [name] mmHg.  3. The mitral valve is grossly normal. No evidence of mitral valve regurgitation. No evidence of mitral stenosis.  4. The aortic valve is normal in structure. There is mild calcification of the aortic valve. Aortic valve regurgitation is not visualized. No aortic stenosis is present.  5. The inferior vena cava is normal in size with greater than 50% respiratory variability, suggesting right atrial pressure of 3 mmHg.  6. Increased flow velocities may be secondary to anemia, thyrotoxicosis, hyperdynamic or high flow state. Conclusion(s)/Recommendation(s): Consider limited echo for evaluation for LVOT obstruction and complete assessment of diastolic function if clinically indicated. FINDINGS  Left Ventricle: Left ventricular ejection fraction, by estimation, is 65%. The left ventricle has normal function. The left ventricle has no regional wall motion abnormalities. The left ventricular internal cavity size was normal in size. There is moderate left ventricular hypertrophy. Left ventricular diastolic function could not be evaluated. Right Ventricle: The right ventricular size is normal. No increase in right ventricular wall thickness. Right ventricular systolic function is normal. There is normal pulmonary artery systolic pressure. The tricuspid regurgitant velocity is 2.52 m/s, and  with an assumed right atrial pressure of 3 mmHg, the estimated right ventricular systolic pressure is [name] mmHg. Left  Atrium: Left atrial size was normal in size. Right Atrium: Right atrial size was normal in size. Pericardium: There is no evidence of pericardial effusion. Mitral Valve: The mitral valve is grossly normal. There is mild calcification of the mitral valve leaflet(s). No evidence of mitral valve regurgitation. No evidence of mitral valve stenosis. Tricuspid Valve: The tricuspid valve is normal in structure. Tricuspid valve regurgitation is trivial. No evidence of tricuspid stenosis. Aortic Valve: The aortic valve is normal in structure. There is mild calcification of the aortic valve. Aortic valve regurgitation is not visualized. No aortic stenosis is present. Aortic valve mean gradient measures 5.0 mmHg. Aortic valve peak gradient measures 9.4 mmHg. Aortic valve area, by VTI measures 2.70 cm. Pulmonic Valve: The pulmonic valve was normal in structure. Pulmonic valve regurgitation is trivial. No evidence of pulmonic stenosis. Aorta: The aortic root is normal in size and structure. Venous: The inferior vena cava is normal in size with greater than 50% respiratory variability, suggesting right atrial pressure of 3 mmHg. IAS/Shunts: No atrial level shunt detected by color flow Doppler.  LEFT VENTRICLE PLAX 2D LVIDd:         3.30 cm  Diastology LVIDs:         2.30 cm  LV e' medial:  5.00 cm/s LV PW:         1.30 cm  LV e' lateral: 7.94 cm/s LV IVS:        1.30 cm LVOT diam:     2.00 cm LV SV:         86 LV SV Index:   46 LVOT Area:     3.14 cm  RIGHT VENTRICLE             IVC RV S prime:     14.00 cm/s  IVC diam: 1.10 cm LEFT ATRIUM             Index LA diam:        2.70 cm 1.44 cm/m LA Vol (A2C):   18.7 ml 9.95 ml/m LA Vol (A4C):   17.9 ml 9.52 ml/m LA Biplane Vol: 20.2 ml 10.75 ml/m  AORTIC VALVE  PULMONIC VALVE AV Area (Vmax):    3.02 cm     PV Vmax:       0.93 m/s AV Area (Vmean):   2.97 cm     PV Peak grad:  3.5 mmHg AV Area (VTI):     2.70 cm AV Vmax:           153.00 cm/s AV Vmean:           109.000 cm/s AV VTI:            0.319 m AV Peak Grad:      9.4 mmHg AV Mean Grad:      5.0 mmHg LVOT Vmax:         147.00 cm/s LVOT Vmean:        103.000 cm/s LVOT VTI:          0.274 m LVOT/AV VTI ratio: 0.86  AORTA Ao Root diam: 3.00 cm Ao Asc diam:  3.40 cm TRICUSPID VALVE TV Peak grad:   20.8 mmHg TV Vmax:        2.28 m/s TR Peak grad:   25.4 mmHg TR Vmax:        252.00 cm/s  SHUNTS Systemic VTI:  0.27 m Systemic Diam: 2.00 cm Cherlynn Kaiser MD Electronically signed by Cherlynn Kaiser MD Signature Date/Time: 02/10/2021/5:02:42 PM    Final     Microbiology: Recent Results (from the past 240 hour(s))  Resp Panel by RT-PCR (Flu Jonnathan Birman&B, Covid) Nasopharyngeal Swab     Status: None   Collection Time: 02/10/21  2:15 AM   Specimen: Nasopharyngeal Swab; Nasopharyngeal(NP) swabs in vial transport medium  Result Value Ref Range Status   SARS Coronavirus 2 by RT PCR NEGATIVE NEGATIVE Final    Comment: (NOTE) SARS-CoV-2 target nucleic acids are NOT DETECTED.  The SARS-CoV-2 RNA is generally detectable in upper respiratory specimens during the acute phase of infection. The lowest concentration of SARS-CoV-2 viral copies this assay can detect is 138 copies/mL. Mayzee Reichenbach negative result does not preclude SARS-Cov-2 infection and should not be used as the sole basis for treatment or other patient management decisions. Kvon Mcilhenny negative result may occur with  improper specimen collection/handling, submission of specimen other than nasopharyngeal swab, presence of viral mutation(s) within the areas targeted by this assay, and inadequate number of viral copies(<138 copies/mL). [place] Agostinelli negative result must be combined with clinical observations, patient history, and epidemiological information. The expected result is Negative.  Fact Sheet for Patients:  EntrepreneurPulse.com.au  Fact Sheet for Healthcare Providers:  IncredibleEmployment.be  This test is no t yet approved or cleared by the  [place] FDA and  has been authorized for detection and/or diagnosis of SARS-CoV-2 by FDA under an Emergency Use Authorization (EUA). This EUA will remain  in effect (meaning this test can be used) for the duration of the COVID-19 declaration under Section 564(b)(1) of the Act, 21 U.S.C.section 360bbb-3(b)(1), unless the authorization is terminated  or revoked sooner.       Influenza Mackenize Delgadillo by PCR NEGATIVE NEGATIVE Final   Influenza B by PCR NEGATIVE NEGATIVE Final    Comment: (NOTE) The Xpert Xpress SARS-CoV-2/FLU/RSV plus assay is intended as an aid in the diagnosis of influenza from Nasopharyngeal swab specimens and should not be used as Jordynn Marcella sole basis for treatment. Nasal washings and aspirates are unacceptable for Xpert Xpress SARS-CoV-2/FLU/RSV testing.  Fact Sheet for Patients: EntrepreneurPulse.com.au  Fact Sheet for Healthcare Providers: IncredibleEmployment.be  This test is not yet approved or cleared by the [place]  FDA and has been authorized for detection and/or diagnosis of SARS-CoV-2 by FDA under an Emergency Use Authorization (EUA). This EUA will remain in effect (meaning this test can be used) for the duration of the COVID-19 declaration under Section 564(b)(1) of the Act, 21 U.S.C. section 360bbb-3(b)(1), unless the authorization is terminated or revoked.  Performed at [facility], [address], [place], [place] [address]      Labs: Basic Metabolic Panel: Recent Labs  Lab 02/09/21 2329 02/10/21 0500 02/11/21 0315  NA 138 137 135  K 3.5 3.9 3.1*  CL 101 104 101  CO2 '27 25 27  '$ GLUCOSE 138* 134* 104*  BUN '21 19 16  '$ CREATININE 1.08* 1.07* 1.13*  CALCIUM 9.4 9.1 8.9  MG  --   --  2.0  PHOS  --   --  3.9   Liver Function Tests: Recent Labs  Lab 02/09/21 2329 02/11/21 0315  AST 24 23  ALT 23 24  ALKPHOS 70 66  BILITOT 0.7 0.7  PROT 7.1 6.0*  ALBUMIN 4.0 3.2*   Recent Labs  Lab 02/09/21 2329   LIPASE 39   No results for input(s): AMMONIA in the last 168 hours. CBC: Recent Labs  Lab 02/09/21 2329 02/10/21 0500 02/11/21 0315  WBC 7.5 8.8 6.0  NEUTROABS 5.5  --  2.7  HGB 12.4 11.6* 11.0*  HCT 38.8 35.6* 33.6*  MCV 96.8 96.2 94.1  PLT 302 274 273   Cardiac Enzymes: No results for input(s): CKTOTAL, CKMB, CKMBINDEX, TROPONINI in the last 168 hours. BNP: BNP (last 3 results) No results for input(s): BNP in the last 8760 hours.  ProBNP (last 3 results) No results for input(s): PROBNP in the last 8760 hours.  CBG: No results for input(s): GLUCAP in the last 168 hours.     Signed:  Fayrene Helper MD.  [facility] 02/11/2021, 1:43 PM

## 2021-02-11 NOTE — Evaluation (Signed)
Physical Therapy Evaluation Patient Details Name: Casey Taylor MRN: [id number] DOB: [birth year] Today's Date: 02/11/2021   History of Present Illness  85 y.o. female presents to [facility] ED on 02/09/2021 with reports of dizziness and chest pain. PMH includes breast cancer, HTN.  Clinical Impression  Pt presents to PT at or near functional baseline. Pt denies chest pain or dizziness during session and is able to perform all mobility independently at this time. Pt expresses no concerns for mobility currently and is in agreement with sign off of acute PT services. No PT or DME needs at the time of discharge.    Follow Up Recommendations No PT follow up    Equipment Recommendations  None recommended by PT    Recommendations for Other Services       Precautions / Restrictions Precautions Precautions: None Restrictions Weight Bearing Restrictions: No      Mobility  Bed Mobility Overal bed mobility: Independent                  Transfers Overall transfer level: Independent                  Ambulation/Gait Ambulation/Gait assistance: Independent Gait Distance (Feet): 300 Feet Assistive device: None Gait Pattern/deviations: WFL(Within Functional Limits) Gait velocity: functional Gait velocity interpretation: >2.62 ft/sec, indicative of community ambulatory General Gait Details: pt performs head turns, changes gait speed, stops abruptly, turns quickly, and changes step length/height without loss of balance  Stairs            Wheelchair Mobility    Modified Rankin (Stroke Patients Only)       Balance Overall balance assessment: Independent                                           Pertinent Vitals/Pain Pain Assessment: No/denies pain    Home Living Family/patient expects to be discharged to:: Private residence Living Arrangements: Alone   Type of Home: House Home Access: Level entry     Home Layout: One level Home  Equipment: Environmental consultant - 2 wheels;Walker - 4 wheels;Cane - single point;Shower seat;Bedside commode      Prior Function Level of Independence: Independent         Comments: pt reports PRN use of cane in community settings     Hand Dominance        Extremity/Trunk Assessment   Upper Extremity Assessment Upper Extremity Assessment: Overall WFL for tasks assessed    Lower Extremity Assessment Lower Extremity Assessment: Overall WFL for tasks assessed    Cervical / Trunk Assessment Cervical / Trunk Assessment: Normal  Communication   Communication: No difficulties  Cognition Arousal/Alertness: Awake/alert Behavior During Therapy: WFL for tasks assessed/performed Overall Cognitive Status: Within Functional Limits for tasks assessed                                        General Comments General comments (skin integrity, edema, etc.): VSS on RA    Exercises     Assessment/Plan    PT Assessment Patent does not need any further PT services  PT Problem List         PT Treatment Interventions      PT Goals (Current goals can be found in the Care Plan section)  Frequency     Barriers to discharge        Co-evaluation               AM-PAC PT "6 Clicks" Mobility  Outcome Measure Help needed turning from your back to your side while in a flat bed without using bedrails?: None Help needed moving from lying on your back to sitting on the side of a flat bed without using bedrails?: None Help needed moving to and from a bed to a chair (including a wheelchair)?: None Help needed standing up from a chair using your arms (e.g., wheelchair or bedside chair)?: None Help needed to walk in hospital room?: None Help needed climbing 3-5 steps with a railing? : None 6 Click Score: 24    End of Session   Activity Tolerance: Patient tolerated treatment well Patient left: in bed;with call bell/phone within reach Nurse Communication: Mobility  status PT Visit Diagnosis: Other abnormalities of gait and mobility (R26.89)    Time: [id number] PT Time Calculation (min) (ACUTE ONLY): 11 min   Charges:   PT Evaluation $PT Eval Low Complexity: [place], PT, DPT Acute Rehabilitation Pager: [phone]   Zenaida Niece 02/11/2021, 11:01 AM

## 2021-02-11 NOTE — Progress Notes (Signed)
Progress Note  Patient Name: Casey Taylor Date of Encounter: 02/11/2021  [facility] Cardiologist: None New to Dr. Burt Knack  Subjective   Seen after stress test. No further chest pain. Feeling better after BM.  Inpatient Medications    Scheduled Meds:  allopurinol  100 mg Oral Daily   aspirin EC  81 mg Oral Daily   enoxaparin (LOVENOX) injection  40 mg Subcutaneous Q24H   gabapentin  300 mg Oral Daily   losartan  25 mg Oral Daily   magnesium oxide  200 mg Oral Daily   potassium chloride  40 mEq Oral Once   Continuous Infusions:  PRN Meds: acetaminophen **OR** acetaminophen, nitroGLYCERIN   Vital Signs    Vitals:   02/11/21 0910 02/11/21 0928 02/11/21 0931 02/11/21 0932  BP: (!) 123/59 135/68 132/70 126/69  Pulse: 60 80 76 78  Resp:      Temp:      TempSrc:      SpO2:      Weight:      Height:        Intake/Output Summary (Last 24 hours) at 02/11/2021 1153 Last data filed at 02/10/2021 1155 Gross per 24 hour  Intake --  Output 850 ml  Net -850 ml   Last 3 Weights 02/10/2021 09/26/2020 02/29/2020  Weight (lbs) 192 lb 3.9 oz 189 lb 189 lb  Weight (kg) 87.2 kg 85.73 kg 85.73 kg      Telemetry    NSR - Personally Reviewed  ECG    NSR - Personally Reviewed  Physical Exam   GEN: No acute distress.   Neck: No JVD Cardiac: RRR, no murmurs, rubs, or gallops.  Respiratory: Clear to auscultation bilaterally. GI: Soft, nontender, non-distended  MS: No edema; No deformity. Neuro:  Nonfocal  Psych: Normal affect   Labs    High Sensitivity Troponin:   Recent Labs  Lab 02/09/21 2329 02/10/21 0045  TROPONINIHS 8 7      Chemistry Recent Labs  Lab 02/09/21 2329 02/10/21 0500 02/11/21 0315  NA 138 137 135  K 3.5 3.9 3.1*  CL 101 104 101  CO2 '27 25 27  '$ GLUCOSE 138* 134* 104*  BUN '21 19 16  '$ CREATININE 1.08* 1.07* 1.13*  CALCIUM 9.4 9.1 8.9  PROT 7.1  --  6.0*  ALBUMIN 4.0  --  3.2*  AST 24  --  23  ALT 23  --  24  ALKPHOS 70  --  66   BILITOT 0.7  --  0.7  GFRNONAA 51* 52* 49*  ANIONGAP '10 8 7     '$ Hematology Recent Labs  Lab 02/09/21 2329 02/10/21 0500 02/11/21 0315  WBC 7.5 8.8 6.0  RBC 4.01 3.70* 3.57*  HGB 12.4 11.6* 11.0*  HCT 38.8 35.6* 33.6*  MCV 96.8 96.2 94.1  MCH 30.9 31.4 30.8  MCHC 32.0 32.6 32.7  RDW 12.4 12.5 12.5  PLT 302 274 273    BNPNo results for input(s): BNP, PROBNP in the last 168 hours.   DDimer  Recent Labs  Lab 02/09/21 2329  DDIMER 0.67*     Radiology    CT HEAD WO CONTRAST (5MM)  Result Date: 02/10/2021 CLINICAL DATA:  Dizziness EXAM: CT HEAD WITHOUT CONTRAST TECHNIQUE: Contiguous axial images were obtained from the base of the skull through the vertex without intravenous contrast. COMPARISON:  03/25/2020 FINDINGS: Brain: No evidence of acute infarction, hemorrhage, hydrocephalus, extra-axial collection or mass lesion/mass effect. Mild atrophic changes are noted. Vascular: No hyperdense vessel  or unexpected calcification. Skull: Normal. Negative for fracture or focal lesion. Sinuses/Orbits: No acute finding. Other: None. IMPRESSION: Mild atrophic changes without acute intracranial abnormality. Electronically Signed   By: Inez Catalina M.D.   On: 02/10/2021 00:20   MR BRAIN WO CONTRAST  Result Date: 02/10/2021 CLINICAL DATA:  85 year old female with persistent dizziness. EXAM: MRI HEAD WITHOUT CONTRAST TECHNIQUE: Multiplanar, multiecho pulse sequences of the brain and surrounding structures were obtained without intravenous contrast. COMPARISON:  Brain MRI 03/01/2020.  Head CT 0016 hours today. FINDINGS: Brain: Cerebral volume appears stable since last year and normal for age. No restricted diffusion to suggest acute infarction. No midline shift, mass effect, evidence of mass lesion, ventriculomegaly, extra-axial collection or acute intracranial hemorrhage. Cervicomedullary junction and pituitary are within normal limits. Pearline Cables and white matter signal is stable since 2021 and largely  normal for age. There is a chronic microhemorrhage in the left periatrial white matter. But no other chronic cerebral blood products and no cortical encephalomalacia. Minimal white matter T2 and FLAIR hyperintensity. Deep gray matter nuclei, brainstem and cerebellum appear normal. Normal for age. Vascular: Major intracranial vascular flow voids are stable since last year, with dominant right and diminutive left distal vertebral arteries. Skull and upper cervical spine: Partially visible cervical spine disc and endplate degeneration with at least mild degenerative spinal stenosis at C3-C4 and C4-C5 on series 9, image 12. Visible bone marrow signal is stable and within normal limits. Sinuses/Orbits: Stable, negative. Other: Mastoids remain clear. Grossly normal visible internal auditory structures. Negative visible scalp and face soft tissues. IMPRESSION: 1. No acute intracranial abnormality. Noncontrast MRI appearance of the brain is stable since 2021 and essentially normal for age. 2. Partially visible cervical spine degeneration with mild degenerative spinal stenosis at C3-C4 and C4-C5. Electronically Signed   By: Genevie Ann M.D.   On: 02/10/2021 05:59   NM Myocar Multi W/Spect W/Wall Motion / EF  Result Date: 02/11/2021 CLINICAL DATA:  Chest pain. EXAM: MYOCARDIAL IMAGING WITH SPECT (REST AND PHARMACOLOGIC-STRESS) GATED LEFT VENTRICULAR WALL MOTION STUDY LEFT VENTRICULAR EJECTION FRACTION TECHNIQUE: Standard myocardial SPECT imaging was performed after resting intravenous injection of 11 mCi Tc-32mtetrofosmin. Subsequently, intravenous infusion of Lexiscan was performed under the supervision of the Cardiology staff. At peak effect of the drug, 30.2 mCi Tc-95metrofosmin was injected intravenously and standard myocardial SPECT imaging was performed. Quantitative gated imaging was also performed to evaluate left ventricular wall motion, and estimate left ventricular ejection fraction. COMPARISON:  None. FINDINGS:  Perfusion: No decreased activity in the left ventricle on stress imaging to suggest reversible ischemia or infarction. Wall Motion: Normal left ventricular wall motion. No left ventricular dilation. Left Ventricular Ejection Fraction: 71 % End diastolic volume 56 ml End systolic volume 16 ml IMPRESSION: 1. No reversible ischemia or infarction. 2. Normal left ventricular wall motion. 3. Left ventricular ejection fraction 71% 4. Non invasive risk stratification*: Low *2012 Appropriate Use Criteria for Coronary Revascularization Focused Update: J Am Coll Cardiol. [id number].[email or web address]?articleid=1201161 Electronically Signed   By: DaDorise BullionII M.D.   On: 02/11/2021 11:07   DG Chest Port 1 View  Result Date: 02/09/2021 CLINICAL DATA:  Sudden onset of chest pain. EXAM: PORTABLE CHEST 1 VIEW COMPARISON:  Chest radiograph 11/05/2017 FINDINGS: The cardiomediastinal contours are normal. Calcified mediastinal lymph nodes. Suspected mitral annulus calcifications. Subsegmental atelectasis at the left lung base. Mild streaky right perihilar scarring. Pulmonary vasculature is normal. No consolidation, pleural effusion, or pneumothorax. No acute osseous abnormalities are seen. Surgical clips  project over the right chest wall/breast. IMPRESSION: Subsegmental left basilar atelectasis. Electronically Signed   By: Keith Rake M.D.   On: 02/09/2021 22:55   ECHOCARDIOGRAM COMPLETE  Result Date: 02/10/2021    ECHOCARDIOGRAM REPORT   Patient Name:   Chandrika ANN Ellsworth Date of Exam: 02/10/2021 Medical Rec #:  [id number]             Height:       62.0 in Accession #:    [id number]            Weight:       192.2 lb Date of Birth:  06/13/1936            BSA:          1.880 m Patient Age:    85 years              BP:           108/69 mmHg Patient Gender: F                     HR:           60 bpm. Exam Location:  Inpatient Procedure: 2D Echo, Cardiac Doppler and Color Doppler  Indications:   R07.9* Chest pain, unspecified  History:       Patient has no prior history of Echocardiogram examinations.                Angina.  Sonographer:   Tawnya Crook Referring      [place]: IMPRESSIONS  1. Left ventricular ejection fraction, by estimation, is 65%. The left ventricle has normal function. The left ventricle has no regional wall motion abnormalities. There is moderate left ventricular hypertrophy with midcavitary and LVOT color flow acceleration. Doppler alignment suboptimal for assessment of LVOT or mid cavitary obstruction. Left ventricular diastolic function was not fully assessed.  2. Right ventricular systolic function is normal. The right ventricular size is normal. There is normal pulmonary artery systolic pressure. The estimated right ventricular systolic pressure is [name] mmHg.  3. The mitral valve is grossly normal. No evidence of mitral valve regurgitation. No evidence of mitral stenosis.  4. The aortic valve is normal in structure. There is mild calcification of the aortic valve. Aortic valve regurgitation is not visualized. No aortic stenosis is present.  5. The inferior vena cava is normal in size with greater than 50% respiratory variability, suggesting right atrial pressure of 3 mmHg.  6. Increased flow velocities may be secondary to anemia, thyrotoxicosis, hyperdynamic or high flow state. Conclusion(s)/Recommendation(s): Consider limited echo for evaluation for LVOT obstruction and complete assessment of diastolic function if clinically indicated. FINDINGS  Left Ventricle: Left ventricular ejection fraction, by estimation, is 65%. The left ventricle has normal function. The left ventricle has no regional wall motion abnormalities. The left ventricular internal cavity size was normal in size. There is moderate left ventricular hypertrophy. Left ventricular diastolic function could not be evaluated. Right Ventricle: The right ventricular size is normal. No  increase in right ventricular wall thickness. Right ventricular systolic function is normal. There is normal pulmonary artery systolic pressure. The tricuspid regurgitant velocity is 2.52 m/s, and  with an assumed right atrial pressure of 3 mmHg, the estimated right ventricular systolic pressure is [name] mmHg. Left Atrium: Left atrial size was normal in size. Right Atrium: Right atrial size was normal in size. Pericardium: There is no evidence of pericardial effusion. Mitral Valve: The mitral valve is  grossly normal. There is mild calcification of the mitral valve leaflet(s). No evidence of mitral valve regurgitation. No evidence of mitral valve stenosis. Tricuspid Valve: The tricuspid valve is normal in structure. Tricuspid valve regurgitation is trivial. No evidence of tricuspid stenosis. Aortic Valve: The aortic valve is normal in structure. There is mild calcification of the aortic valve. Aortic valve regurgitation is not visualized. No aortic stenosis is present. Aortic valve mean gradient measures 5.0 mmHg. Aortic valve peak gradient measures 9.4 mmHg. Aortic valve area, by VTI measures 2.70 cm. Pulmonic Valve: The pulmonic valve was normal in structure. Pulmonic valve regurgitation is trivial. No evidence of pulmonic stenosis. Aorta: The aortic root is normal in size and structure. Venous: The inferior vena cava is normal in size with greater than 50% respiratory variability, suggesting right atrial pressure of 3 mmHg. IAS/Shunts: No atrial level shunt detected by color flow Doppler.  LEFT VENTRICLE PLAX 2D LVIDd:         3.30 cm  Diastology LVIDs:         2.30 cm  LV e' medial:  5.00 cm/s LV PW:         1.30 cm  LV e' lateral: 7.94 cm/s LV IVS:        1.30 cm LVOT diam:     2.00 cm LV SV:         86 LV SV Index:   46 LVOT Area:     3.14 cm  RIGHT VENTRICLE             IVC RV S prime:     14.00 cm/s  IVC diam: 1.10 cm LEFT ATRIUM             Index LA diam:        2.70 cm 1.44 cm/m LA Vol (A2C):   18.7 ml  9.95 ml/m LA Vol (A4C):   17.9 ml 9.52 ml/m LA Biplane Vol: 20.2 ml 10.75 ml/m  AORTIC VALVE                    PULMONIC VALVE AV Area (Vmax):    3.02 cm     PV Vmax:       0.93 m/s AV Area (Vmean):   2.97 cm     PV Peak grad:  3.5 mmHg AV Area (VTI):     2.70 cm AV Vmax:           153.00 cm/s AV Vmean:          109.000 cm/s AV VTI:            0.319 m AV Peak Grad:      9.4 mmHg AV Mean Grad:      5.0 mmHg LVOT Vmax:         147.00 cm/s LVOT Vmean:        103.000 cm/s LVOT VTI:          0.274 m LVOT/AV VTI ratio: 0.86  AORTA Ao Root diam: 3.00 cm Ao Asc diam:  3.40 cm TRICUSPID VALVE TV Peak grad:   20.8 mmHg TV Vmax:        2.28 m/s TR Peak grad:   25.4 mmHg TR Vmax:        252.00 cm/s  SHUNTS Systemic VTI:  0.27 m Systemic Diam: 2.00 cm Cherlynn Kaiser MD Electronically signed by Cherlynn Kaiser MD Signature Date/Time: 02/10/2021/5:02:42 PM    Final     Cardiac Studies   Nuclear stress test 02/11/21  IMPRESSION: 1. No reversible ischemia or infarction.   2. Normal left ventricular wall motion.   3. Left ventricular ejection fraction 71%   4. Non invasive risk stratification*: Low  Echo 02/10/21 1. Left ventricular ejection fraction, by estimation, is 65%. The left  ventricle has normal function. The left ventricle has no regional wall  motion abnormalities. There is moderate left ventricular hypertrophy with  midcavitary and LVOT color flow  acceleration. Doppler alignment suboptimal for assessment of LVOT or mid  cavitary obstruction. Left ventricular diastolic function was not fully  assessed.   2. Right ventricular systolic function is normal. The right ventricular  size is normal. There is normal pulmonary artery systolic pressure. The  estimated right ventricular systolic pressure is [name] mmHg.   3. The mitral valve is grossly normal. No evidence of mitral valve  regurgitation. No evidence of mitral stenosis.   4. The aortic valve is normal in structure. There is mild  calcification  of the aortic valve. Aortic valve regurgitation is not visualized. No  aortic stenosis is present.   5. The inferior vena cava is normal in size with greater than 50%  respiratory variability, suggesting right atrial pressure of 3 mmHg.   6. Increased flow velocities may be secondary to anemia, thyrotoxicosis,  hyperdynamic or high flow state.   Conclusion(s)/Recommendation(s): Consider limited echo for evaluation for  LVOT obstruction and complete assessment of diastolic function if  clinically indicated.   Patient Profile     85 y.o. female with PMH hypertension, TIA who is seen in consultation for chest pain at the request of Dr. Hal Hope  Assessment & Plan    Chest pain: has not recurred. Nuclear stress test today, reassuring.  LVOT flow on echo reviewed. No significant murmur today. Can follow up clinical symptoms as an outpatient  Hypertension: continue current regimen  [facility] will sign off.   Medication Recommendations:  return to home medications, with history of TIA would continue aspirin Other recommendations (labs, testing, etc):  none Follow up as an outpatient:  We will have her see Dr. Burt Knack or a member of his team for outpatient follow up  For questions or updates, please contact [facility] Please consult [email or web address] for contact info under        Signed, Buford Dresser, MD  02/11/2021, 11:53 AM

## 2021-02-11 NOTE — TOC Transition Note (Signed)
Transition of Care [facility]) - CM/SW Discharge Note   Patient Details  Name: Casey Taylor MRN: [id number] Date of Birth: 19-Sep-1935  [facility]) CM/SW Contact:  Konrad Penta, RN Phone Number: [phone] 02/11/2021, 1:58 PM   Clinical Narrative:   Spoke with patient who confirms she does not have PCP. Agreeable to writer contacting her daughter Lavella Lemons to provide [facility] number to call for to find new PCP. Placed telephone number on dc paper work as well.   Patient lives alone. Supportive daughter. No further needs assessed.       Patient Goals and CMS Choice        Discharge Placement                 Discharge Plan and Services                Social Determinants of Health (SDOH) Interventions     Readmission Risk Interventions No flowsheet data found.

## 2021-03-13 DIAGNOSIS — Z23 Encounter for immunization: Secondary | ICD-10-CM | POA: Diagnosis not present

## 2021-03-14 ENCOUNTER — Ambulatory Visit: Payer: Medicare Other | Attending: Internal Medicine

## 2021-03-14 DIAGNOSIS — Z23 Encounter for immunization: Secondary | ICD-10-CM

## 2021-03-14 NOTE — Progress Notes (Signed)
Cardiology Office Note:    Date:  03/17/2021   ID:  Casey Taylor, DOB September 28, 1935, MRN [id number]  PCP:  Suella Broad, FNP   [facility] Providers Cardiologist:  Skeet Latch, MD      Referring MD: Suella Broad,*    Follow-up for hypertension and chest discomfort  History of Present Illness:    Casey Taylor is a 85 y.o. female with a hx of hypertension, CVA, bunion, OA, breast CA, gout, atypical chest pain, anxiety, short-term memory loss, mild cognitive impairment, and dizziness.  She presented to the emergency department on 02/09/2021 and was discharged on 02/11/2021.  She had a chief complaint of dizziness and chest discomfort.  She ruled out for ACS.  Cardiology was consulted. Echocardiogram showed EF 65%, no regional wall motion abnormalities.  EKG showed no ST or T wave changes.  Troponins were negative x2.  Her aspirin was continued.  A brain MRI 02/10/2021 showed cervical spine degeneration with mild degenerative spinal stenosis at C3-C4 and C4-C5.  No acute intracranial abnormalities.  She presents to the clinic today for follow-up evaluation states she feels well.  She presents with her daughter Kenney Houseman 2 heart Therapist, sports.  She has had no further episodes of dizziness and denies chest discomfort.  We reviewed her echocardiogram, MRI, and lab work from the emergency department.  We discussed the importance of heart healthy low-sodium diet, maintain physical activity, and lower extremity support stockings.  At this time she wishes to defer limited echocardiogram.  When asked about physical activity she reports that she is constantly on the move.  She does however cannot do any formal physical activity.  I have encouraged her to do 1-2 walks daily on top of her routine physical activity.  She reports that she typically uses pepper on her food and does not over salt.  We will request her labs from her PCP.  I will give her information about [facility]  PCP providers, salty 6 diet sheet, [place] support stocking sheet, and have her follow-up in 6 months  Today she denies chest pain, shortness of breath, lower extremity edema, fatigue, palpitations, melena, hematuria, hemoptysis, diaphoresis, weakness, presyncope, syncope, orthopnea, and PND.   Past Medical History:  Diagnosis Date   Anxiety    Arthritis    Atypical chest pain 11/12/2017   Breast cancer ([place])    Essential hypertension 11/12/2017   Gout    Hypertension    dr shelton   Primary localized osteoarthritis of left knee 01/26/2019   Stroke [facility])    tia    06    Past Surgical History:  Procedure Laterality Date   ABDOMINAL HYSTERECTOMY     BREAST BIOPSY Left 05/26/2013   Procedure: BREAST BIOPSY WITH NEEDLE LOCALIZATION;  Surgeon: Stark Klein, MD;  Location: [facility];  Service: General;  Laterality: Left;   BREAST LUMPECTOMY Right 2014   CHOLECYSTECTOMY     PARTIAL MASTECTOMY WITH NEEDLE LOCALIZATION Right 05/26/2013   Procedure: PARTIAL MASTECTOMY WITH NEEDLE LOCALIZATION;  Surgeon: Stark Klein, MD;  Location: [facility];  Service: General;  Laterality: Right;   TOTAL KNEE ARTHROPLASTY Left 01/26/2019   Procedure: TOTAL KNEE ARTHROPLASTY;  Surgeon: Elsie Saas, MD;  Location[facility];  Service: Orthopedics;  Laterality: Left;    Current Medications: No outpatient medications have been marked as taking for the 03/17/21 encounter (Appointment) with Deberah Pelton, NP.     Allergies:   Codeine   Social History   Socioeconomic History  Marital status: Divorced    Spouse name: Not on file   Number of children: 5   Years of education: Not on file   Highest education level: Some college, no degree  Occupational History   Not on file  Tobacco Use   Smoking status: Never   Smokeless tobacco: Never  Vaping Use   Vaping Use: Never used  Substance and Sexual Activity   Alcohol use: Yes    Alcohol/week: 1.0 standard drink    Types: 1 Glasses of wine per week    Comment:  weekly   Drug use: No   Sexual activity: Not on file  Other Topics Concern   Not on file  Social History Narrative   Lives at home alone    Caffeine: coffee maybe 1 cup daily   Right handed   Social Determinants of Health   Financial Resource Strain: Not on file  Food Insecurity: Not on file  Transportation Needs: Not on file  Physical Activity: Not on file  Stress: Not on file  Social Connections: Not on file     Family History: The patient's family history includes Dementia in her mother; Diabetes in her brother, brother, mother, and sister; Esophageal cancer in her brother; Heart attack in her father and mother; Hypertension in her sister; Kidney disease in her brother; Lung cancer in her brother; Prostate cancer in her brother.  ROS:   Please see the history of present illness.     All other systems reviewed and are negative.   Risk Assessment/Calculations:           Physical Exam:    VS:  There were no vitals taken for this visit.    Wt Readings from Last 3 Encounters:  02/10/21 192 lb 3.9 oz (87.2 kg)  09/26/20 189 lb (85.7 kg)  02/29/20 189 lb (85.7 kg)     GEN:  Well nourished, well developed in no acute distress HEENT: Normal NECK: No JVD; No carotid bruits LYMPHATICS: No lymphadenopathy CARDIAC: RRR, no murmurs, rubs, gallops RESPIRATORY:  Clear to auscultation without rales, wheezing or rhonchi  ABDOMEN: Soft, non-tender, non-distended MUSCULOSKELETAL:  No edema; No deformity  SKIN: Warm and dry NEUROLOGIC:  Alert and oriented x 3 PSYCHIATRIC:  Normal affect    EKGs/Labs/Other Studies Reviewed:    The following studies were reviewed today:  ETT 11/20/2017 Blood pressure demonstrated a normal response to exercise. There was no ST segment deviation noted during stress.   1. Mildly impaired exercise tolerance.  2. No evidence for ischemia by ST segment analysis.   Echocardiogram 02/10/2021  IMPRESSIONS     1. Left ventricular ejection  fraction, by estimation, is 65%. The left  ventricle has normal function. The left ventricle has no regional wall  motion abnormalities. There is moderate left ventricular hypertrophy with  midcavitary and LVOT color flow  acceleration. Doppler alignment suboptimal for assessment of LVOT or mid  cavitary obstruction. Left ventricular diastolic function was not fully  assessed.   2. Right ventricular systolic function is normal. The right ventricular  size is normal. There is normal pulmonary artery systolic pressure. The  estimated right ventricular systolic pressure is 71.2 mmHg.   3. The mitral valve is grossly normal. No evidence of mitral valve  regurgitation. No evidence of mitral stenosis.   4. The aortic valve is normal in structure. There is mild calcification  of the aortic valve. Aortic valve regurgitation is not visualized. No  aortic stenosis is present.   5. The  inferior vena cava is normal in size with greater than 50%  respiratory variability, suggesting right atrial pressure of 3 mmHg.   6. Increased flow velocities may be secondary to anemia, thyrotoxicosis,  hyperdynamic or high flow state.   Conclusion(s)/Recommendation(s): Consider limited echo for evaluation for  LVOT obstruction and complete assessment of diastolic function if  clinically indicated.  EKG: None today.  Recent Labs: 02/11/2021: ALT 24; BUN 16; Creatinine, Ser 1.13; Hemoglobin 11.0; Magnesium 2.0; Platelets 273; Potassium 3.1; Sodium 135; TSH 1.291  Recent Lipid Panel    Component Value Date/Time   CHOL 165 02/10/2021 0500   TRIG 105 02/10/2021 0500   HDL 39 (L) 02/10/2021 0500   CHOLHDL 4.2 02/10/2021 0500   VLDL 21 02/10/2021 0500   LDLCALC 105 (H) 02/10/2021 0500    ASSESSMENT & PLAN    Atypical chest discomfort-no further episodes of on neck back or chest discomfort.  Echocardiogram during recent ED visit showed EF 65%, moderate LVH, and no significant valvular abnormalities.   Recommendation for limited echo to evaluate LVOT obstruction and complete assessment of diastolic function. Continue aspirin, losartan Heart healthy low-sodium diet-salty 6 given Increase physical activity as tolerated Order limited echocardiogram  Essential hypertension-BP today 130/72.  Well-controlled at home. Continue losartan Heart healthy low-sodium diet-salty 6 given Increase physical activity as tolerated  Hyperlipidemia-02/10/2021: Cholesterol 165; HDL 39; LDL Cholesterol 105; Triglycerides 105; VLDL 21 Continue aspirin, pravastatin Heart healthy low-sodium high-fiber diet Increase physical activity as tolerated  Vertigo-resolved.  MRI brain showed cervical degenerative changes. Follows with PCP  Disposition: Follow-up with Dr. Oval Linsey or me in 6 month.       Medication Adjustments/Labs and Tests Ordered: Current medicines are reviewed at length with the patient today.  Concerns regarding medicines are outlined above.  No orders of the defined types were placed in this encounter.  No orders of the defined types were placed in this encounter.   There are no Patient Instructions on file for this visit.   Signed, Deberah Pelton, NP  03/17/2021 8:01 AM      Notice: This dictation was prepared with Dragon dictation along with smaller phrase technology. Any transcriptional errors that result from this process are unintentional and may not be corrected upon review.  I spent 14 minutes examining this patient, reviewing medications, and using patient centered shared decision making involving her cardiac care.  Prior to her visit I spent greater than 20 minutes reviewing her past medical history,  medications, and prior cardiac tests.

## 2021-03-14 NOTE — Progress Notes (Signed)
   Covid-19 Vaccination Clinic  Name:  Kayliana Codd People    MRN: [id number] DOB: [birth year]  03/14/2021  Ms. Dingee was observed post Covid-19 immunization for 15 minutes without incident. She was provided with Vaccine Information Sheet and instruction to access the V-Safe system.   Ms. Min was instructed to call 911 with any severe reactions post vaccine: Difficulty breathing  Swelling of face and throat  A fast heartbeat  A bad rash all over body  Dizziness and weakness

## 2021-03-15 DIAGNOSIS — Z136 Encounter for screening for cardiovascular disorders: Secondary | ICD-10-CM | POA: Diagnosis not present

## 2021-03-15 DIAGNOSIS — Z7689 Persons encountering health services in other specified circumstances: Secondary | ICD-10-CM | POA: Diagnosis not present

## 2021-03-15 DIAGNOSIS — R0609 Other forms of dyspnea: Secondary | ICD-10-CM | POA: Diagnosis not present

## 2021-03-15 DIAGNOSIS — E041 Nontoxic single thyroid nodule: Secondary | ICD-10-CM | POA: Diagnosis not present

## 2021-03-15 DIAGNOSIS — I10 Essential (primary) hypertension: Secondary | ICD-10-CM | POA: Diagnosis not present

## 2021-03-15 DIAGNOSIS — Z0000 Encounter for general adult medical examination without abnormal findings: Secondary | ICD-10-CM | POA: Diagnosis not present

## 2021-03-15 DIAGNOSIS — Z760 Encounter for issue of repeat prescription: Secondary | ICD-10-CM | POA: Diagnosis not present

## 2021-03-17 ENCOUNTER — Encounter (HOSPITAL_BASED_OUTPATIENT_CLINIC_OR_DEPARTMENT_OTHER): Payer: Self-pay | Admitting: General Practice

## 2021-03-17 ENCOUNTER — Other Ambulatory Visit: Payer: Self-pay

## 2021-03-17 ENCOUNTER — Ambulatory Visit (INDEPENDENT_AMBULATORY_CARE_PROVIDER_SITE_OTHER): Payer: Medicare Other | Admitting: General Practice

## 2021-03-17 VITALS — BP 130/72 | HR 77 | Ht 62.0 in | Wt 189.7 lb

## 2021-03-17 DIAGNOSIS — I10 Essential (primary) hypertension: Secondary | ICD-10-CM

## 2021-03-17 DIAGNOSIS — E782 Mixed hyperlipidemia: Secondary | ICD-10-CM | POA: Diagnosis not present

## 2021-03-17 DIAGNOSIS — R42 Dizziness and giddiness: Secondary | ICD-10-CM | POA: Diagnosis not present

## 2021-03-17 DIAGNOSIS — R0789 Other chest pain: Secondary | ICD-10-CM

## 2021-03-17 NOTE — Patient Instructions (Signed)
Medication Instructions:  Continue current medications  *If you need a refill on your cardiac medications before your next appointment, please call your pharmacy*   Lab Work: None Ordered If you have labs (blood work) drawn today and your tests are completely normal,    Testing/Procedures: None Ordered   Follow-Up: At [facility], you and your health needs are our priority.  As part of our continuing mission to provide you with exceptional heart care, we have created designated Provider Care Teams.  These Care Teams include your primary Cardiologist (physician) and Advanced Practice Providers (APPs -  Physician Assistants and Nurse Practitioners) who all work together to provide you with the care you need, when you need it.  We recommend signing up for the patient portal called "MyChart".  Sign up information is provided on this After Visit Summary.  MyChart is used to connect with patients for Virtual Visits (Telemedicine).  Patients are able to view lab/test results, encounter notes, upcoming appointments, etc.  Non-urgent messages can be sent to your provider as well.   To learn more about what you can do with MyChart, go to [email or web address].    Your next appointment:   6 month(s)  The format for your next appointment:   In Person  Provider:   Skeet Latch, MD

## 2021-03-22 ENCOUNTER — Encounter: Payer: Self-pay | Admitting: Podiatry

## 2021-03-22 ENCOUNTER — Other Ambulatory Visit: Payer: Self-pay

## 2021-03-22 ENCOUNTER — Ambulatory Visit (INDEPENDENT_AMBULATORY_CARE_PROVIDER_SITE_OTHER): Payer: Medicare Other | Admitting: Podiatry

## 2021-03-22 DIAGNOSIS — M7752 Other enthesopathy of left foot: Secondary | ICD-10-CM

## 2021-03-22 DIAGNOSIS — M7751 Other enthesopathy of right foot: Secondary | ICD-10-CM

## 2021-03-22 DIAGNOSIS — M109 Gout, unspecified: Secondary | ICD-10-CM

## 2021-03-22 MED ORDER — DEXAMETHASONE SODIUM PHOSPHATE 120 MG/30ML IJ SOLN
4.0000 mg | Freq: Once | INTRAMUSCULAR | Status: AC
  Administered 2021-03-22: 4 mg via INTRA_ARTICULAR

## 2021-03-22 NOTE — Progress Notes (Addendum)
  Subjective:  Patient ID: Casey Taylor, female    DOB: 1936/01/10,   MRN: [id number]  Chief Complaint  Patient presents with   Gout    Left foot gout flare up x 2 days. Burning with edema. Worse when walking    85 y.o. female presents for right foot gout flare for the last two days. Has been following with Dr. Amalia Hailey and had injection for flare in the past. Currently taking colchicine and on allopurinol. Hoping for injection today. . Denies any other pedal complaints. Denies n/v/f/c.   Past Medical History:  Diagnosis Date   Anxiety    Arthritis    Atypical chest pain 11/12/2017   Breast cancer ([place])    Essential hypertension 11/12/2017   Gout    Hypertension    dr shelton   Primary localized osteoarthritis of left knee 01/26/2019   Stroke [facility])    tia    06    Objective:  Physical Exam: Vascular: DP/PT pulses 2/4 bilateral. CFT <3 seconds. Normal hair growth on digits. No edema.  Skin. No lacerations or abrasions bilateral feet. Erythema and edema noted over dorsal foot and first MPJ.  Musculoskeletal: MMT 5/5 bilateral lower extremities in DF, PF, Inversion and Eversion. Deceased ROM in DF of ankle joint. Tender over dorsal first metatrsal phalangeal joint and dorsal foot right.  Neurological: Sensation intact to light touch.   Assessment:   1. Acute gout of right foot, unspecified cause   2. Capsulitis of metatarsophalangeal (MTP) joint of right foot      Plan:  Patient was evaluated and treated and all questions answered. -Xrays reviewed -Discussed treatement options for gouty arthritis and gout education provided. -Patient opted for injection. After oral consent, injected right first MPJ with 1cc marcaine plain mixed with 1 cc Dexmethasone phosphate without complication; post injection care explained. -Discussed diet and modifications.  -Continue Colchicine 0.6mg  -Advised patient to call if symptoms are not improved within 1 week -Discussed follow-up with  PCP for management of chronic gout.  -Patient to return as needed for further gout flares.    Lorenda Peck, DPM

## 2021-03-23 ENCOUNTER — Telehealth: Payer: Self-pay | Admitting: *Deleted

## 2021-03-23 NOTE — Telephone Encounter (Signed)
Called and informed the patient notes will be addended

## 2021-03-23 NOTE — Telephone Encounter (Signed)
Patient is calling because AVS received 1 day ago stated that she was treated for left foot but it was her right foot that was treated. Please advise.

## 2021-03-24 ENCOUNTER — Other Ambulatory Visit (HOSPITAL_BASED_OUTPATIENT_CLINIC_OR_DEPARTMENT_OTHER): Payer: Self-pay

## 2021-03-24 MED ORDER — COVID-19MRNA BIVAL VACC PFIZER 30 MCG/0.3ML IM SUSP
INTRAMUSCULAR | 0 refills | Status: DC
  Filled 2021-03-24: qty 0.3, 1d supply, fill #0

## 2021-05-15 DIAGNOSIS — Z1331 Encounter for screening for depression: Secondary | ICD-10-CM | POA: Diagnosis not present

## 2021-05-15 DIAGNOSIS — Z0001 Encounter for general adult medical examination with abnormal findings: Secondary | ICD-10-CM | POA: Diagnosis not present

## 2021-05-15 DIAGNOSIS — Z760 Encounter for issue of repeat prescription: Secondary | ICD-10-CM | POA: Diagnosis not present

## 2021-05-15 DIAGNOSIS — M109 Gout, unspecified: Secondary | ICD-10-CM | POA: Diagnosis not present

## 2021-05-15 DIAGNOSIS — F419 Anxiety disorder, unspecified: Secondary | ICD-10-CM | POA: Diagnosis not present

## 2021-05-15 DIAGNOSIS — Z139 Encounter for screening, unspecified: Secondary | ICD-10-CM | POA: Diagnosis not present

## 2021-05-15 DIAGNOSIS — Z23 Encounter for immunization: Secondary | ICD-10-CM | POA: Diagnosis not present

## 2021-05-24 ENCOUNTER — Encounter: Payer: Medicare Other | Admitting: Psychology

## 2021-06-13 ENCOUNTER — Ambulatory Visit (INDEPENDENT_AMBULATORY_CARE_PROVIDER_SITE_OTHER): Payer: Self-pay | Admitting: Podiatry

## 2021-06-13 DIAGNOSIS — M79671 Pain in right foot: Secondary | ICD-10-CM

## 2021-06-13 DIAGNOSIS — M79672 Pain in left foot: Secondary | ICD-10-CM

## 2021-06-13 DIAGNOSIS — Z91199 Patient's noncompliance with other medical treatment and regimen due to unspecified reason: Secondary | ICD-10-CM

## 2021-06-13 NOTE — Progress Notes (Signed)
   Complete physical exam  Patient: Casey Taylor   DOB: 04/07/1997   85 y.o. Female  MRN: [id number]  Subjective:    No chief complaint on file.   Casey Taylor is a 85 y.o. female who presents today for a complete physical exam. She reports consuming a {diet types:17450} diet. {types:19826} She generally feels {DESC; WELL/FAIRLY WELL/POORLY:18703}. She reports sleeping {DESC; WELL/FAIRLY WELL/POORLY:18703}. She {does/does not:200015} have additional problems to discuss today.    Most recent fall risk assessment:    12/13/2021   10:42 AM  Fall Risk   Falls in the past year? 0  Number falls in past yr: 0  Injury with Fall? 0  Risk for fall due to : No Fall Risks  Follow up Falls evaluation completed     Most recent depression screenings:    12/13/2021   10:42 AM 11/03/2020   10:46 AM  PHQ 2/9 Scores  PHQ - 2 Score 0 0  PHQ- 9 Score 5     {VISON DENTAL STD PSA (Optional):27386}  {History (Optional):23778}  Patient Care Team: Jessup, Joy, NP as PCP - General (Nurse Practitioner)   Outpatient Medications Prior to Visit  Medication Sig   fluticasone (FLONASE) 50 MCG/ACT nasal spray Place 2 sprays into both nostrils in the morning and at bedtime. After 7 days, reduce to once daily.   norgestimate-ethinyl estradiol (SPRINTEC 28) 0.25-35 MG-MCG tablet Take 1 tablet by mouth daily.   Nystatin POWD Apply liberally to affected area 2 times per day   spironolactone (ALDACTONE) 100 MG tablet Take 1 tablet (100 mg total) by mouth daily.   No facility-administered medications prior to visit.    ROS        Objective:     There were no vitals taken for this visit. {Vitals History (Optional):23777}  Physical Exam   No results found for any visits on 01/18/22. {Show previous labs (optional):23779}    Assessment & Plan:    Routine Health Maintenance and Physical Exam  Immunization History  Administered Date(s) Administered   DTaP 06/21/1999, 08/17/1999,  10/26/1999, 07/11/2000, 01/25/2004   Hepatitis A 11/21/2007, 11/26/2008   Hepatitis B 04/08/1999, 05/16/1999, 10/26/1999   HiB (PRP-OMP) 06/21/1999, 08/17/1999, 10/26/1999, 07/11/2000   IPV 06/21/1999, 08/17/1999, 04/15/2000, 01/25/2004   Influenza,inj,Quad PF,6+ Mos 02/26/2014   Influenza-Unspecified 05/28/2012   MMR 04/15/2001, 01/25/2004   Meningococcal Polysaccharide 11/26/2011   Pneumococcal Conjugate-13 07/11/2000   Pneumococcal-Unspecified 10/26/1999, 01/09/2000   Tdap 11/26/2011   Varicella 04/15/2000, 11/21/2007    Health Maintenance  Topic Date Due   HIV Screening  Never done   Hepatitis C Screening  Never done   INFLUENZA VACCINE  01/16/2022   PAP-Cervical Cytology Screening  01/18/2022 (Originally 04/06/2020)   PAP SMEAR-Modifier  01/18/2022 (Originally 04/06/2020)   TETANUS/TDAP  01/18/2022 (Originally 11/25/2021)   HPV VACCINES  Discontinued   COVID-19 Vaccine  Discontinued    Discussed health benefits of physical activity, and encouraged her to engage in regular exercise appropriate for her age and condition.  Problem List Items Addressed This Visit   None Visit Diagnoses     Annual physical exam    -  Primary   Cervical cancer screening       Need for Tdap vaccination          No follow-ups on file.     Joy Jessup, NP   

## 2021-06-13 NOTE — Addendum Note (Signed)
Addended by: Ruthell Rummage on: 06/13/2021 04:56 PM   Modules accepted: Orders

## 2021-06-14 ENCOUNTER — Telehealth: Payer: Self-pay

## 2021-06-14 NOTE — Telephone Encounter (Signed)
Patient called this morning at 11:05. She forgot her appointment yesterday and would like to reschedule. thanks

## 2021-06-21 ENCOUNTER — Other Ambulatory Visit: Payer: Self-pay

## 2021-06-21 ENCOUNTER — Ambulatory Visit (INDEPENDENT_AMBULATORY_CARE_PROVIDER_SITE_OTHER): Payer: Medicare Other | Admitting: Podiatry

## 2021-06-21 DIAGNOSIS — M778 Other enthesopathies, not elsewhere classified: Secondary | ICD-10-CM

## 2021-06-21 MED ORDER — BETAMETHASONE SOD PHOS & ACET 6 (3-3) MG/ML IJ SUSP
3.0000 mg | Freq: Once | INTRAMUSCULAR | Status: AC
Start: 2021-06-21 — End: ?

## 2021-06-21 MED ORDER — MELOXICAM 15 MG PO TABS: 15.0000 mg | ORAL_TABLET | Freq: Every day | ORAL | 1 refills | Status: DC

## 2021-06-21 NOTE — Progress Notes (Signed)
° °  HPI: 86 y.o. female presenting today for follow-up evaluation of right foot pain.  Patient states that injections in the past have helped significantly.  Today she is experiencing pain to the lateral aspect of the right foot.  She presents for further treatment evaluation.  Currently she has not done anything for treatment  Past Medical History:  Diagnosis Date   Anxiety    Arthritis    Atypical chest pain 11/12/2017   Breast cancer ([place])    Essential hypertension 11/12/2017   Gout    Hypertension    dr shelton   Primary localized osteoarthritis of left knee 01/26/2019   Stroke [facility])    tia    06    Past Surgical History:  Procedure Laterality Date   ABDOMINAL HYSTERECTOMY     BREAST BIOPSY Left 05/26/2013   Procedure: BREAST BIOPSY WITH NEEDLE LOCALIZATION;  Surgeon: Stark Klein, MD;  Location: [facility];  Service: General;  Laterality: Left;   BREAST LUMPECTOMY Right 2014   CHOLECYSTECTOMY     PARTIAL MASTECTOMY WITH NEEDLE LOCALIZATION Right 05/26/2013   Procedure: PARTIAL MASTECTOMY WITH NEEDLE LOCALIZATION;  Surgeon: Stark Klein, MD;  Location: [facility];  Service: General;  Laterality: Right;   TOTAL KNEE ARTHROPLASTY Left 01/26/2019   Procedure: TOTAL KNEE ARTHROPLASTY;  Surgeon: Elsie Saas, MD;  [facility];  Service: Orthopedics;  Laterality: Left;    Allergies  Allergen Reactions   Codeine Nausea Only     Physical Exam: General: The patient is alert and oriented x3 in no acute distress.  Dermatology: Skin is warm, dry and supple bilateral lower extremities. Negative for open lesions or macerations.  Vascular: Palpable pedal pulses bilaterally. Capillary refill within normal limits.  Negative for any significant edema or erythema  Neurological: Light touch and protective threshold grossly intact  Musculoskeletal Exam: No pedal deformities noted.  Pain on palpation to the lateral aspect of the TMT joint right foot  Assessment: 1.  Right foot TMT  capsulitis   Plan of Care:  1. Patient evaluated.  2.  Injection of 0.5 cc Celestone Soluspan injected into the right lateral aspect of the TMT joint 3.  Prescription for meloxicam 15 mg daily 4.  Continue wearing good supportive shoes and sneakers.  Advised against going barefoot 5.  Return to clinic as needed      Edrick Kins, DPM [facility]  Dr. Edrick Kins, DPM    [address] [place], [place] [address]                Office [phone]  Fax [phone]

## 2021-07-25 ENCOUNTER — Encounter: Payer: Medicare Other | Attending: Psychology | Admitting: Psychology

## 2021-08-22 ENCOUNTER — Ambulatory Visit (INDEPENDENT_AMBULATORY_CARE_PROVIDER_SITE_OTHER): Payer: Medicare Other | Admitting: Nurse Practitioner

## 2021-08-22 ENCOUNTER — Encounter (HOSPITAL_BASED_OUTPATIENT_CLINIC_OR_DEPARTMENT_OTHER): Payer: Self-pay | Admitting: Nurse Practitioner

## 2021-08-22 ENCOUNTER — Other Ambulatory Visit: Payer: Self-pay

## 2021-08-22 VITALS — BP 117/72 | HR 96 | Ht 61.0 in | Wt 187.0 lb

## 2021-08-22 DIAGNOSIS — M1A00X0 Idiopathic chronic gout, unspecified site, without tophus (tophi): Secondary | ICD-10-CM

## 2021-08-22 DIAGNOSIS — I10 Essential (primary) hypertension: Secondary | ICD-10-CM | POA: Diagnosis not present

## 2021-08-22 DIAGNOSIS — G3184 Mild cognitive impairment, so stated: Secondary | ICD-10-CM | POA: Diagnosis not present

## 2021-08-22 DIAGNOSIS — E782 Mixed hyperlipidemia: Secondary | ICD-10-CM | POA: Diagnosis not present

## 2021-08-22 DIAGNOSIS — K5730 Diverticulosis of large intestine without perforation or abscess without bleeding: Secondary | ICD-10-CM | POA: Insufficient documentation

## 2021-08-22 DIAGNOSIS — M5441 Lumbago with sciatica, right side: Secondary | ICD-10-CM | POA: Diagnosis not present

## 2021-08-22 DIAGNOSIS — R072 Precordial pain: Secondary | ICD-10-CM

## 2021-08-22 DIAGNOSIS — R600 Localized edema: Secondary | ICD-10-CM | POA: Diagnosis not present

## 2021-08-22 DIAGNOSIS — M1712 Unilateral primary osteoarthritis, left knee: Secondary | ICD-10-CM

## 2021-08-22 DIAGNOSIS — E559 Vitamin D deficiency, unspecified: Secondary | ICD-10-CM | POA: Diagnosis not present

## 2021-08-22 NOTE — Progress Notes (Signed)
Orma Render, DNP, AGNP-c [facility] [address]   [place],  [address] [phone]  New patient visit   Patient: Casey Taylor   DOB: 07/31/1935   86 y.o. Female  MRN: [id number] Visit Date: 08/22/2021  Patient Care Team: Doyel Mulkern, Coralee Pesa, NP as PCP - General (Nurse Practitioner) Skeet Latch, MD as PCP - Cardiology (Cardiology)  Today's healthcare provider: Orma Render, NP   Chief Complaint  Patient presents with   New Patient (Initial Visit)    Patient presents today with daughter to establish care. Left knee pain had replacement by Dr Noemi Chapel several years ago. She would like a full lab panel today.    Subjective    Marcianna Daily Schiff is a 86 y.o. female who presents today as a new patient to establish care.    Patient endorses the following concerns presently: Left knee pain Endorses - chronic left knee pain - prior knee surgery with residual arthritis - has been using biofreeze and topical NSAIDs and Tylenol arthritis with little relief - She denies any redness, warmth, edema, or falls.   Sciatic Pain Endorses - radiating pain in the right buttocks down into posterior thigh - no saddle sx present, weakness, decreased ROM - chronic - has not tried anything to help  She also endorses a history of gout for which she takes daily medication to control. This is working well for her.  She has a hx of HTN that is well controlled with her current regimen. She denies CP, palpitations, weakness, dizziness. She does endorses mild LE edema that is chronic. No leg pain.  She has a history of TIA in 2006 with no residual weakness or symptoms.  She also has a hx of breast cancer with partial mastectomy She has some mild cognitive decline, but functions well independently and currently lives alone. She has a daughter who lives nearby and helps to look after her.   History reviewed and reveals the  following: Past Medical History:  Diagnosis Date   Anxiety    Arthritis    Atypical chest pain 11/12/2017   Breast cancer ([place])    Essential hypertension 11/12/2017   Gout    Hypertension    dr shelton   Primary localized osteoarthritis of left knee 01/26/2019   Stroke [facility])    tia    06   Past Surgical History:  Procedure Laterality Date   ABDOMINAL HYSTERECTOMY     BREAST BIOPSY Left 05/26/2013   Procedure: BREAST BIOPSY WITH NEEDLE LOCALIZATION;  Surgeon: Stark Klein, MD;  Location: [facility];  Service: General;  Laterality: Left;   BREAST LUMPECTOMY Right 2014   CHOLECYSTECTOMY     PARTIAL MASTECTOMY WITH NEEDLE LOCALIZATION Right 05/26/2013   Procedure: PARTIAL MASTECTOMY WITH NEEDLE LOCALIZATION;  Surgeon: Stark Klein, MD;  Location: [place];  Service: General;  Laterality: Right;   TOTAL KNEE ARTHROPLASTY Left 01/26/2019   Procedure: TOTAL KNEE ARTHROPLASTY;  Surgeon: Elsie Saas, MD;  Location: [facility];  Service: Orthopedics;  Laterality: Left;   Family Status  Relation Name Status   Mother  Deceased   Father  Deceased   Sister  Alive   Brother  Deceased   MGM  Deceased   MGF  Deceased   PGM  Deceased   PGF  Deceased   Brother  Alive   Family History  Problem Relation Age of Onset   Heart attack Mother    Diabetes Mother  Dementia Mother    Heart attack Father    Hypertension Sister    Diabetes Sister    Diabetes Brother    Kidney disease Brother    Esophageal cancer Brother    Lung cancer Brother    Prostate cancer Brother    Diabetes Brother    Social History   Socioeconomic History   Marital status: Divorced    Spouse name: Not on file   Number of children: 5   Years of education: Not on file   Highest education level: Some college, no degree  Occupational History   Not on file  Tobacco Use   Smoking status: Never   Smokeless tobacco: Never  Vaping Use   Vaping Use: Never used  Substance and Sexual Activity   Alcohol use: Yes     Alcohol/week: 1.0 standard drink    Types: 1 Glasses of wine per week    Comment: weekly   Drug use: No   Sexual activity: Not on file  Other Topics Concern   Not on file  Social History Narrative   Lives at home alone    Caffeine: coffee maybe 1 cup daily   Right handed   Social Determinants of Health   Financial Resource Strain: Not on file  Food Insecurity: Not on file  Transportation Needs: Not on file  Physical Activity: Not on file  Stress: Not on file  Social Connections: Not on file   Outpatient Medications Prior to Visit  Medication Sig   allopurinol (ZYLOPRIM) 100 MG tablet Take 1 tablet (100 mg total) by mouth daily.   ALPRAZolam (XANAX) 0.25 MG tablet Take 0.25 mg by mouth 2 (two) times daily as needed.   aspirin 81 MG EC tablet aspirin 81 mg tablet,delayed release  TAKE 1 TABLET BY MOUTH ONCE DAILY SWALLOW WHOLE   Cholecalciferol (VITAMIN D PO) Take 2,000 Units by mouth daily.    colchicine 0.6 MG tablet Take 1 tablet (0.6 mg total) by mouth daily as needed (gout).   hydrocortisone 2.5 % cream hydrocortisone 2.5 % topical cream  APPLY CREAM TO AFFECTED AREA TWICE DAILY ON FACE, 1 WEEK ON, OFF, AS NEEDED. CAN USE ON BODY AREAS 2 WEEKS ON, OFF, AS NEEDED.   losartan (COZAAR) 25 MG tablet Take 25 mg by mouth daily.   Magnesium 250 MG TABS Take 250 mg by mouth.    meloxicam (MOBIC) 15 MG tablet Take 1 tablet (15 mg total) by mouth daily.   pravastatin (PRAVACHOL) 40 MG tablet Take 1 tablet (40 mg total) by mouth every evening.   triamcinolone (KENALOG) 0.025 % ointment Apply topically.   [DISCONTINUED] COVID-19 mRNA bivalent vaccine, Pfizer, injection Inject into the muscle.   [DISCONTINUED] gabapentin (NEURONTIN) 300 MG capsule Take 1 capsule (300 mg total) by mouth 3 (three) times daily.   Facility-Administered Medications Prior to Visit  Medication Dose Route Frequency Provider   betamethasone acetate-betamethasone sodium phosphate (CELESTONE) injection 3 mg  3 mg  Intra-articular Once Edrick Kins, DPM   Allergies  Allergen Reactions   Codeine Nausea Only   Immunization History  Administered Date(s) Administered   Influenza-Unspecified 02/16/2014, 02/16/2021   PFIZER Comirnaty(Gray Top)Covid-19 Tri-Sucrose Vaccine 09/27/2020   PFIZER(Purple Top)SARS-COV-2 Vaccination 03/25/2020   Pfizer Covid-19 Vaccine Bivalent Booster 34yr & up 03/14/2021   Pneumococcal Conjugate-13 08/17/2011    Health Maintenance Due: Health Maintenance  Topic Date Due   TETANUS/TDAP  Never done   Zoster Vaccines- Shingrix (1 of 2) Never done   Pneumonia  Vaccine 92+ Years old (2 - PPSV23 if available, else PCV20) 08/16/2012   COVID-19 Vaccine (4 - Booster for Pfizer series) 05/09/2021   INFLUENZA VACCINE  Completed   DEXA SCAN  Completed   HPV VACCINES  Aged Out    Review of Systems All review of systems negative except what is listed in the HPI   Objective    BP 117/72    Pulse 96    Ht '5\' 1"'$  (1.549 m)    Wt 187 lb (84.8 kg)    SpO2 96%    BMI 35.33 kg/m  Physical Exam Vitals and nursing note reviewed.  Constitutional:      General: She is not in acute distress.    Appearance: Normal appearance.  Eyes:     Extraocular Movements: Extraocular movements intact.     Conjunctiva/sclera: Conjunctivae normal.     Pupils: Pupils are equal, round, and reactive to light.  Neck:     Vascular: No carotid bruit.  Cardiovascular:     Rate and Rhythm: Normal rate and regular rhythm.     Pulses: Normal pulses.     Heart sounds: Normal heart sounds. No murmur heard. Pulmonary:     Effort: Pulmonary effort is normal.     Breath sounds: Normal breath sounds. No wheezing.  Abdominal:     General: Bowel sounds are normal.     Palpations: Abdomen is soft.  Musculoskeletal:     Cervical back: Normal range of motion.     Right knee: Normal.     Left knee: Bony tenderness and crepitus present. No swelling or ecchymosis. Decreased range of motion. Normal pulse.      Right lower leg: 1+ Edema present.     Left lower leg: 1+ Edema present.  Skin:    General: Skin is warm and dry.     Capillary Refill: Capillary refill takes less than 2 seconds.  Neurological:     General: No focal deficit present.     Mental Status: She is alert and oriented to person, place, and time.  Psychiatric:        Mood and Affect: Mood normal.        Behavior: Behavior normal.        Thought Content: Thought content normal.        Judgment: Judgment normal.    No results found for any visits on 08/22/21.  Assessment & Plan      Problem List Items Addressed This Visit     Essential hypertension - Primary    Chronic. Controlled. No alarm sx present at this time.  Review of recent ECHO. Mild LE edema present, suspect this is related to lymphedema given length of time this has been present and visualization of the edema.  Recommend compression stockings and elevating feet to help prevent worsening. She will let me know if this seems to be getting worse.       Relevant Orders   LDL cholesterol, direct   CBC with Differential/Platelet   Comprehensive metabolic panel   VITAMIN D 25 Hydroxy (Vit-D Deficiency, Fractures)   Magnesium   Primary localized osteoarthritis of left knee    Chronic. Mild relief with topical and oral analgesics.  Discussed the option of injectable steroids with patient that may help with reduction of pain.  She will consider this and let me know if this is something she would like to pursue in the future. For now, recommend continue with current medications and work to increase activity.  Mild cognitive impairment    Chronic. Previous testing shows that decline related to aging process. No current alarm symptoms or worsening symptoms present at this time.  Protective medications in place for cardiovascular health. Encouraged patient to increase physical activity to help with prevention of further loss.  Will continue to monitor.        Chest pain    Resolved. Review of testing from previous encounter with patient and her daughter today. Recommend continue current medications to help with cardiovascular management. Seek emergency care immediately if symptoms present again.       Lower leg edema    Mild bilateral LE swelling at the ankles. Suspect lymphedema is present. Recommend usage of compression stockings and elevation of feet while seating with increased daily physical activity to help prevent worsening of symptoms. No alarm symptoms present today.  F/U if sx worsen       Acute bilateral low back pain with right-sided sciatica    Symptoms and presentation consistent with sciatica of the right side with no limitations in ROM.  Discussed conservative management with patient that may be helpful. Recommend trial of stretching exercises, topical NSAIDs, lidocaine topically, and TENS unit therapy.  Patient is in agreement to trying these above methods. Will send order for TENS unit to see if insurance will cover for her. If not, discussion on over the counter purchasing was completed.  She will follow up if the symptoms worsen or fail to improve.       Relevant Medications   ALPRAZolam (XANAX) 0.25 MG tablet   Vitamin D deficiency, unspecified    Will monitor. Continue replacement therapy.       Relevant Orders   VITAMIN D 25 Hydroxy (Vit-D Deficiency, Fractures)   Mixed hyperlipidemia    Chronic. Continue statin therapy. Will monitor. No changes to medication at this time.  Recommend increased physical activity and monitoring of diet to reduce saturated fats.        Relevant Orders   LDL cholesterol, direct   CBC with Differential/Platelet   Comprehensive metabolic panel   VITAMIN D 25 Hydroxy (Vit-D Deficiency, Fractures)   Magnesium   Other Visit Diagnoses     Magnesium disorder       Relevant Orders   Magnesium        Return in about 6 months (around 02/22/2022) for HTN .     Jayjay Littles, Coralee Pesa, NP,  DNP, AGNP-C [facility]

## 2021-08-22 NOTE — Patient Instructions (Addendum)
Thank you for choosing [place] at [facility] for your Primary Care needs. I am excited for the opportunity to partner with you to meet your health care goals. It was a pleasure meeting you today!  Recommendations from today's visit: Try famotidine (Pepcid) or simethicone (Gas x) to see if this helps your gurgling stomach. If the symptoms get worse or do not get better with this, let me know.  I would try the compression stockings to see if this helps with the swelling in your feet. If this gets any worse, let me know. Be sure you wear the compression stockings while you are traveling or sitting for a long period of time.  I recommend a TENS unit to help with the sciatic pain. This can be helpful. I will send the prescription in for you and the company will contact you if there is any cost.  If you take Meloxicam, be sure not to take ibuprofen with it because these are very similar medications.   Information on diet, exercise, and health maintenance recommendations are listed below. This is information to help you be sure you are on track for optimal health and monitoring.   Please look over this and let us know if you have any questions or if you have completed any of the health maintenance outside of [place] so that we can be sure your records are up to date.  ___________________________________________________________ About Me: I am an Adult-Geriatric Nurse Practitioner with a background in caring for patients for more than 20 years with a strong intensive care background. I provide primary care and sports medicine services to patients age 27 and older within this office. My education had a strong focus on caring for the older adult population, which I am passionate about. I am also the director of the APP Fellowship with [facility].   My desire is to provide you with the best service through preventive medicine and supportive care. I consider you a part of the medical  team and value your input. I work diligently to ensure that you are heard and your needs are met in a safe and effective manner. I want you to feel comfortable with me as your provider and want you to know that your health concerns are important to me.  For your information, our office hours are: Monday, Tuesday, and Thursday 8:00 AM - 5:00 PM Wednesday and Friday 8:00 AM - 12:00 PM.   In my time away from the office I am teaching new APP's within the system and am unavailable, but my partner, Dr. Burnard Bunting is in the office for emergent needs.   If you have questions or concerns, please call our office at [phone] or send [place] a MyChart message and we will respond as quickly as possible.  ____________________________________________________________ MyChart:  For all urgent or time sensitive needs we ask that you please call the office to avoid delays. Our number is [phone]. MyChart is not constantly monitored and due to the large volume of messages a day, replies may take up to 72 business hours.  MyChart Policy: MyChart allows for you to see your visit notes, after visit summary, provider recommendations, lab and tests results, make an appointment, request refills, and contact your provider or the office for non-urgent questions or concerns. Providers are seeing patients during normal business hours and do not have built in time to review MyChart messages.  We ask that you allow a minimum of 3 business days for responses  to [employer]. For this reason, please do not send urgent requests through [place]. Please call the office at [phone]. New and ongoing conditions may require a visit. We have virtual and in person visit available for your convenience.  Complex MyChart concerns may require a visit. Your provider may request you schedule a virtual or in person visit to ensure we are providing the best care possible. MyChart messages sent after 11:00 AM on Friday will not be  received by the provider until Monday morning.    Lab and Test Results: You will receive your lab and test results on MyChart as soon as they are completed and results have been sent by the lab or testing facility. Due to this service, you will receive your results BEFORE your provider.  I review lab and tests results each morning prior to seeing patients. Some results require collaboration with other providers to ensure you are receiving the most appropriate care. For this reason, we ask that you please allow a minimum of 3-5 business days from the time the ALL results have been received for your provider to receive and review lab and test results and contact you about these.  Most lab and test result comments from the provider will be sent through [place]. Your provider may recommend changes to the plan of care, follow-up visits, repeat testing, ask questions, or request an office visit to discuss these results. You may reply directly to this message or call the office at [phone] to provide information for the provider or set up an appointment. In some instances, you will be called with test results and recommendations. Please let us know if this is preferred and we will make note of this in your chart to provide this for you.    If you have not heard a response to your lab or test results in 5 business days from all results returning to [place], please call the office to let us know. We ask that you please avoid calling prior to this time unless there is an emergent concern. Due to high call volumes, this can delay the resulting process.  After Hours: For all non-emergency after hours needs, please call the office at [phone] and select the option to reach the on-call provider service. On-call services are shared between multiple [place] offices and therefore it will not be possible to speak directly with your provider. On-call providers may provide medical advice and recommendations, but  are unable to provide refills for maintenance medications.  For all emergency or urgent medical needs after normal business hours, we recommend that you seek care at the closest Urgent Care or Emergency Department to ensure appropriate treatment in a timely manner.  [facility] [place] has a 24 hour emergency room located on the ground floor for your convenience.   Urgent Concerns During the Business Day Providers are seeing patients from 8AM to [place] with a busy schedule and are most often not able to respond to non-urgent calls until the end of the day or the next business day. If you should have URGENT concerns during the day, please call and speak to the nurse or schedule a same day appointment so that we can address your concern without delay.   Thank you, again, for choosing me as your health care partner. I appreciate your trust and look forward to learning more about you.   Worthy Keeler, DNP, AGNP-c ___________________________________________________________  Health Maintenance Recommendations Screening Testing Mammogram Every 1 -2 years based on history  and risk factors Starting at age 11 Pap Smear Ages 21-39 every 3 years Ages 46-65 every 5 years with HPV testing More frequent testing may be required based on results and history Colon Cancer Screening Every 1-10 years based on test performed, risk factors, and history Starting at age 10 Bone Density Screening Every 2-10 years based on history Starting at age 83 for women Recommendations for men differ based on medication usage, history, and risk factors AAA Screening One time ultrasound Men 64-8 years old who have every smoked Lung Cancer Screening Low Dose Lung CT every 12 months Age 68-80 years with a 30 pack-year smoking history who still smoke or who have quit within the last 15 years  Screening Labs Routine  Labs: Complete Blood Count (CBC), Complete Metabolic Panel (CMP), Cholesterol (Lipid  Panel) Every 6-12 months based on history and medications May be recommended more frequently based on current conditions or previous results Hemoglobin A1c Lab Every 3-12 months based on history and previous results Starting at age 73 or earlier with diagnosis of diabetes, high cholesterol, BMI >26, and/or risk factors Frequent monitoring for patients with diabetes to ensure blood sugar control Thyroid Panel (TSH w/ T3 & T4) Every 6 months based on history, symptoms, and risk factors May be repeated more often if on medication HIV One time testing for all patients 75 and older May be repeated more frequently for patients with increased risk factors or exposure Hepatitis C One time testing for all patients 63 and older May be repeated more frequently for patients with increased risk factors or exposure Gonorrhea, Chlamydia Every 12 months for all sexually active persons 13-24 years Additional monitoring may be recommended for those who are considered high risk or who have symptoms PSA Men 19-58 years old with risk factors Additional screening may be recommended from age 47-69 based on risk factors, symptoms, and history  Vaccine Recommendations Tetanus Booster All adults every 10 years Flu Vaccine All patients 6 months and older every year COVID Vaccine All patients 12 years and older Initial dosing with booster May recommend additional booster based on age and health history HPV Vaccine 2 doses all patients age 76-26 Dosing may be considered for patients over 26 Shingles Vaccine (Shingrix) 2 doses all adults 43 years and older Pneumonia (Pneumovax 23) All adults 12 years and older May recommend earlier dosing based on health history Pneumonia (Prevnar 39) All adults 85 years and older Dosed 1 year after Pneumovax 23  Additional Screening, Testing, and Vaccinations may be recommended on an individualized basis based on family history, health history, risk factors, and/or  exposure.  __________________________________________________________  Diet Recommendations for All Patients  I recommend that all patients maintain a diet low in saturated fats, carbohydrates, and cholesterol. While this can be challenging at first, it is not impossible and small changes can make big differences.  Things to try: Decreasing the amount of soda, sweet tea, and/or juice to one or less per day and replace with water While water is always the first choice, if you do not like water you may consider adding a water additive without sugar to improve the taste other sugar free drinks Replace potatoes with a brightly colored vegetable at dinner Use healthy oils, such as canola oil or olive oil, instead of butter or hard margarine Limit your bread intake to two pieces or less a day Replace regular pasta with low carb pasta options Bake, broil, or grill foods instead of frying Monitor portion sizes  Eat  smaller, more frequent meals throughout the day instead of large meals  An important thing to remember is, if you love foods that are not great for your health, you don't have to give them up completely. Instead, allow these foods to be a reward when you have done well. Allowing yourself to still have special treats every once in a while is a nice way to tell yourself thank you for working hard to keep yourself healthy.   Also remember that every day is a new day. If you have a bad day and "fall off the wagon", you can still climb right back up and keep moving along on your journey!  We have resources available to help you!  Some websites that may be helpful include: www.http://carter.biz/  Www.VeryWellFit.com _____________________________________________________________  Activity Recommendations for All Patients  I recommend that all adults get at least 20 minutes of moderate physical activity that elevates your heart rate at least 5 days out of the week.  Some examples include: Walking  or jogging at a pace that allows you to carry on a conversation Cycling (stationary bike or outdoors) Water aerobics Yoga Weight lifting Dancing If physical limitations prevent you from putting stress on your joints, exercise in a pool or seated in a chair are excellent options.  Do determine your MAXIMUM heart rate for activity: YOUR AGE - 220 = MAX HeartRate   Remember! Do not push yourself too hard.  Start slowly and build up your pace, speed, weight, time in exercise, etc.  Allow your body to rest between exercise and get good sleep. You will need more water than normal when you are exerting yourself. Do not wait until you are thirsty to drink. Drink with a purpose of getting in at least 8, 8 ounce glasses of water a day plus more depending on how much you exercise and sweat.    If you begin to develop dizziness, chest pain, abdominal pain, jaw pain, shortness of breath, headache, vision changes, lightheadedness, or other concerning symptoms, stop the activity and allow your body to rest. If your symptoms are severe, seek emergency evaluation immediately. If your symptoms are concerning, but not severe, please let us know so that we can recommend further evaluation.

## 2021-08-23 DIAGNOSIS — E782 Mixed hyperlipidemia: Secondary | ICD-10-CM | POA: Insufficient documentation

## 2021-08-23 DIAGNOSIS — M5441 Lumbago with sciatica, right side: Secondary | ICD-10-CM

## 2021-08-23 DIAGNOSIS — R600 Localized edema: Secondary | ICD-10-CM | POA: Insufficient documentation

## 2021-08-23 DIAGNOSIS — E559 Vitamin D deficiency, unspecified: Secondary | ICD-10-CM | POA: Insufficient documentation

## 2021-08-23 HISTORY — DX: Lumbago with sciatica, right side: M54.41

## 2021-08-23 MED ORDER — PRAVASTATIN SODIUM 40 MG PO TABS: 40.0000 mg | ORAL_TABLET | Freq: Every evening | ORAL | 1 refills | Status: DC

## 2021-08-23 MED ORDER — ASPIRIN 81 MG PO TBEC: DELAYED_RELEASE_TABLET | ORAL | 1 refills | Status: AC

## 2021-08-23 MED ORDER — ALLOPURINOL 100 MG PO TABS: 100.0000 mg | ORAL_TABLET | Freq: Every day | ORAL | 1 refills | Status: DC

## 2021-08-23 MED ORDER — LOSARTAN POTASSIUM 25 MG PO TABS: 25.0000 mg | ORAL_TABLET | Freq: Every day | ORAL | 1 refills | Status: DC

## 2021-08-23 NOTE — Assessment & Plan Note (Signed)
Mild bilateral LE swelling at the ankles. Suspect lymphedema is present. Recommend usage of compression stockings and elevation of feet while seating with increased daily physical activity to help prevent worsening of symptoms. No alarm symptoms present today.  ?F/U if sx worsen  ?

## 2021-08-23 NOTE — Assessment & Plan Note (Signed)
Chronic. Controlled. No alarm sx present at this time.  ?Review of recent ECHO. Mild LE edema present, suspect this is related to lymphedema given length of time this has been present and visualization of the edema.  ?Recommend compression stockings and elevating feet to help prevent worsening. She will let me know if this seems to be getting worse.  ?

## 2021-08-23 NOTE — Assessment & Plan Note (Signed)
Chronic. Mild relief with topical and oral analgesics.  ?Discussed the option of injectable steroids with patient that may help with reduction of pain.  ?She will consider this and let me know if this is something she would like to pursue in the future. For now, recommend continue with current medications and work to increase activity.  ?

## 2021-08-23 NOTE — Assessment & Plan Note (Signed)
Will monitor. Continue replacement therapy.  ?

## 2021-08-23 NOTE — Assessment & Plan Note (Signed)
>>  ASSESSMENT AND PLAN FOR PRIMARY LOCALIZED OSTEOARTHRITIS OF LEFT KNEE WRITTEN ON 08/23/2021  4:21 PM BY Timiko Offutt E, NP  Chronic. Mild relief with topical and oral analgesics.  Discussed the option of injectable steroids with patient that may help with reduction of pain.  She will consider this and let me know if this is something she would like to pursue in the future. For now, recommend continue with current medications and work to increase activity.

## 2021-08-23 NOTE — Assessment & Plan Note (Signed)
Chronic. Previous testing shows that decline related to aging process. No current alarm symptoms or worsening symptoms present at this time.  ?Protective medications in place for cardiovascular health. Encouraged patient to increase physical activity to help with prevention of further loss.  ?Will continue to monitor.  ?

## 2021-08-23 NOTE — Assessment & Plan Note (Signed)
Chronic. Continue statin therapy. Will monitor. No changes to medication at this time.  ?Recommend increased physical activity and monitoring of diet to reduce saturated fats.  ? ?

## 2021-08-23 NOTE — Assessment & Plan Note (Signed)
Symptoms and presentation consistent with sciatica of the right side with no limitations in ROM.  ?Discussed conservative management with patient that may be helpful. Recommend trial of stretching exercises, topical NSAIDs, lidocaine topically, and TENS unit therapy.  ?Patient is in agreement to trying these above methods. Will send order for TENS unit to see if insurance will cover for her. If not, discussion on over the counter purchasing was completed.  ?She will follow up if the symptoms worsen or fail to improve.  ?

## 2021-08-23 NOTE — Assessment & Plan Note (Signed)
Resolved. Review of testing from previous encounter with patient and her daughter today. Recommend continue current medications to help with cardiovascular management. Seek emergency care immediately if symptoms present again.  ?

## 2021-08-26 LAB — CBC WITH DIFFERENTIAL/PLATELET
Basophils Absolute: 0.1 10*3/uL (ref 0.0–0.2)
Basos: 1 %
EOS (ABSOLUTE): 0.2 10*3/uL (ref 0.0–0.4)
Eos: 3 %
Hematocrit: 40.2 % (ref 34.0–46.6)
Hemoglobin: 13.2 g/dL (ref 11.1–15.9)
Immature Grans (Abs): 0 10*3/uL (ref 0.0–0.1)
Immature Granulocytes: 0 %
Lymphocytes Absolute: 2.3 10*3/uL (ref 0.7–3.1)
Lymphs: 32 %
MCH: 30.4 pg (ref 26.6–33.0)
MCHC: 32.8 g/dL (ref 31.5–35.7)
MCV: 93 fL (ref 79–97)
Monocytes Absolute: 0.6 10*3/uL (ref 0.1–0.9)
Monocytes: 8 %
Neutrophils Absolute: 3.9 10*3/uL (ref 1.4–7.0)
Neutrophils: 56 %
Platelets: 341 10*3/uL (ref 150–450)
RBC: 4.34 x10E6/uL (ref 3.77–5.28)
RDW: 12.1 % (ref 11.7–15.4)
WBC: 7 10*3/uL (ref 3.4–10.8)

## 2021-08-26 LAB — COMPREHENSIVE METABOLIC PANEL
ALT: 16 IU/L (ref 0–32)
AST: 19 IU/L (ref 0–40)
Albumin/Globulin Ratio: 2 (ref 1.2–2.2)
Albumin: 4.8 g/dL — ABNORMAL HIGH (ref 3.6–4.6)
Alkaline Phosphatase: 140 IU/L — ABNORMAL HIGH (ref 44–121)
BUN/Creatinine Ratio: 14 (ref 12–28)
BUN: 15 mg/dL (ref 8–27)
Bilirubin Total: 0.2 mg/dL (ref 0.0–1.2)
CO2: 20 mmol/L (ref 20–29)
Calcium: 10.1 mg/dL (ref 8.7–10.3)
Creatinine, Ser: 1.08 mg/dL — ABNORMAL HIGH (ref 0.57–1.00)
Globulin, Total: 2.4 g/dL (ref 1.5–4.5)
Glucose: 104 mg/dL — ABNORMAL HIGH (ref 70–99)
Total Protein: 7.2 g/dL (ref 6.0–8.5)
eGFR: 51 mL/min/{1.73_m2} — ABNORMAL LOW (ref >59)

## 2021-08-26 LAB — MAGNESIUM: Magnesium: 2.2 mg/dL (ref 1.6–2.3)

## 2021-08-26 LAB — LDL CHOLESTEROL, DIRECT: LDL Direct: 96 mg/dL (ref 0–99)

## 2021-08-26 LAB — VITAMIN D 25 HYDROXY (VIT D DEFICIENCY, FRACTURES): Vit D, 25-Hydroxy: 48 ng/mL (ref 30.0–100.0)

## 2021-08-28 NOTE — Progress Notes (Signed)
Have results ready to be mailed out today. Pt is scheduled for virtual visit on 3/20 ?

## 2021-08-28 NOTE — Progress Notes (Signed)
Waiting for response form SB to over book or push out appt due to provider not having anything at end of the day for this type of appt. ?

## 2021-09-04 ENCOUNTER — Other Ambulatory Visit: Payer: Self-pay

## 2021-09-04 ENCOUNTER — Encounter (HOSPITAL_BASED_OUTPATIENT_CLINIC_OR_DEPARTMENT_OTHER): Payer: Self-pay | Admitting: Nurse Practitioner

## 2021-09-04 ENCOUNTER — Ambulatory Visit (INDEPENDENT_AMBULATORY_CARE_PROVIDER_SITE_OTHER): Payer: Medicare Other | Admitting: Nurse Practitioner

## 2021-09-04 DIAGNOSIS — R739 Hyperglycemia, unspecified: Secondary | ICD-10-CM

## 2021-09-04 DIAGNOSIS — N1831 Chronic kidney disease, stage 3a: Secondary | ICD-10-CM | POA: Insufficient documentation

## 2021-09-04 DIAGNOSIS — Z20822 Contact with and (suspected) exposure to covid-19: Secondary | ICD-10-CM | POA: Diagnosis not present

## 2021-09-04 MED ORDER — DAPAGLIFLOZIN PROPANEDIOL 5 MG PO TABS: 5.0000 mg | ORAL_TABLET | Freq: Every day | ORAL | 3 refills | Status: DC

## 2021-09-04 NOTE — Assessment & Plan Note (Signed)
Labs consistent with CKD 3a with current eGFR at 51. ?Discussion with patient of the current status of her kidney function and recommendations to help preserve from further decline. She is in agreement to start [place]. This may also help control her higher than expected BG levels that have also been present on recent labs.  ?Discussed side effects with patient including increased urination and dehydration. Recommend increasing water intake to at least 64 ounces of water daily.  ?Also recommend repeat labs in 3 months to monitor current kidney function and blood glucose levels.  ?Will plan to start medication and monitor.  ?

## 2021-09-04 NOTE — Progress Notes (Signed)
Virtual Visit Encounter  telephone visit. ? ? ?I connected with  Casey Taylor on 09/04/21 at  3:50 PM EDT by secure audio enabled telemedicine application. I verified that I am speaking with the correct person using two identifiers. ?  ?I introduced myself as a Designer, jewellery with the practice. The limitations of evaluation and management by telemedicine discussed with the patient and the availability of in person appointments. The patient expressed verbal understanding and consent to proceed. ? ?Participating parties in this visit include: Myself and patient ? ?The patient is: Patient Location: Home ?I am: Provider Location: Office/Clinic ?Subjective:   ? ?CC and HPI: Casey Taylor is a 86 y.o. year old female presenting for follow up of recent labs. ?Recent lab results show evidence of declining kidney function for the past 2 years. Labs also show evidence of higher than expected BG values.  ?Patient reports that she has not seen urology for her kidney function in the past.  ?She denies decreased or increased urinary output, increased thirst, or increased urination.  ? ?Past medical history, Surgical history, Family history not pertinant except as noted below, Social history, Allergies, and medications have been entered into the medical record, reviewed, and corrections made.  ? ?Review of Systems:  ?All review of systems negative except what is listed in the HPI ? ?Objective:   ? ?Alert and oriented x 4 ?Speaking in clear sentences with no shortness of breath. ?No distress. ? ?Impression and Recommendations:   ? ?Problem List Items Addressed This Visit   ? ? Chronic renal failure, stage 3a (HCC) - Primary  ?  Labs consistent with CKD 3a with current eGFR at 51. ?Discussion with patient of the current status of her kidney function and recommendations to help preserve from further decline. She is in agreement to start [place]. This may also help control her higher than expected BG levels that  have also been present on recent labs.  ?Discussed side effects with patient including increased urination and dehydration. Recommend increasing water intake to at least 64 ounces of water daily.  ?Also recommend repeat labs in 3 months to monitor current kidney function and blood glucose levels.  ?Will plan to start medication and monitor.  ?  ?  ? ?Other Visit Diagnoses   ? ? Blood glucose elevated      ? ?  ? ? ?orders and follow up as documented in EMR ?I discussed the assessment and treatment plan with the patient. The patient was provided an opportunity to ask questions and all were answered. The patient agreed with the plan and demonstrated an understanding of the instructions. ?  ?The patient was advised to call back or seek an in-person evaluation if the symptoms worsen or if the condition fails to improve as anticipated. ? ?Follow-Up: phone call in 2 weeks- repeat labs 3 months ? ?I provided 22 minutes of non-face-to-face interaction with this non face-to-face encounter including intake, same-day documentation, and chart review.  ? ?Orma Render, NP , DNP, AGNP-c ?[facility] ?Primary Care & Sports Medicine at [facility] ?[phone] ?[phone] (fax) ? ?

## 2021-09-05 ENCOUNTER — Encounter (HOSPITAL_BASED_OUTPATIENT_CLINIC_OR_DEPARTMENT_OTHER): Payer: Self-pay | Admitting: Cardiovascular Disease

## 2021-09-05 ENCOUNTER — Other Ambulatory Visit: Payer: Self-pay

## 2021-09-05 ENCOUNTER — Ambulatory Visit (INDEPENDENT_AMBULATORY_CARE_PROVIDER_SITE_OTHER): Payer: Medicare Other | Admitting: Cardiovascular Disease

## 2021-09-05 VITALS — BP 124/88 | HR 86 | Ht 61.0 in | Wt 188.0 lb

## 2021-09-05 DIAGNOSIS — E782 Mixed hyperlipidemia: Secondary | ICD-10-CM

## 2021-09-05 DIAGNOSIS — I10 Essential (primary) hypertension: Secondary | ICD-10-CM

## 2021-09-05 DIAGNOSIS — N1831 Chronic kidney disease, stage 3a: Secondary | ICD-10-CM | POA: Diagnosis not present

## 2021-09-05 DIAGNOSIS — Z8673 Personal history of transient ischemic attack (TIA), and cerebral infarction without residual deficits: Secondary | ICD-10-CM

## 2021-09-05 NOTE — Patient Instructions (Signed)
Medication Instructions:  ?Your physician recommends that you continue on your current medications as directed. Please refer to the Current Medication list given to you today.  ?*If you need a refill on your cardiac medications before your next appointment, please call your pharmacy* ? ?Lab Work: ?BMET TODAY  ? ?If you have labs (blood work) drawn today and your tests are completely normal, you will receive your results only by: ?MyChart Message (if you have MyChart) OR ?A paper copy in the mail ?If you have any lab test that is abnormal or we need to change your treatment, we will call you to review the results. ? ?Testing/Procedures: ?NONE ? ?Follow-Up: ?At [facility], you and your health needs are our priority.  As part of our continuing mission to provide you with exceptional heart care, we have created designated Provider Care Teams.  These Care Teams include your primary Cardiologist (physician) and Advanced Practice Providers (APPs -  Physician Assistants and Nurse Practitioners) who all work together to provide you with the care you need, when you need it. ? ?We recommend signing up for the patient portal called "MyChart".  Sign up information is provided on this After Visit Summary.  MyChart is used to connect with patients for Virtual Visits (Telemedicine).  Patients are able to view lab/test results, encounter notes, upcoming appointments, etc.  Non-urgent messages can be sent to your provider as well.   ?To learn more about what you can do with MyChart, go to [email or web address].   ? ?Your next appointment:   ?12 month(s) ? ?The format for your next appointment:   ?In Person ? ?Provider:   ?Skeet Latch, MD{ ? ?

## 2021-09-05 NOTE — Progress Notes (Signed)
? ? ?Cardiology Office Note ? ? ?Date:  09/05/2021  ? ?ID:  Casey Taylor, DOB 11-Oct-1935, MRN [id number] ? ?PCP:  Orma Render, NP  ?Cardiologist:   Skeet Latch, MD  ? ?No chief complaint on file. ? ?  ?History of Present Illness: ?Casey Taylor is a 86 y.o. female with hypertension, TIA, and prior breast cancer s/p partial mastectomy here for follow-up.  She was initially seen 10/2017 for an evaluation of shortness of breath and chest discomfort.  Her symptoms were found to be more so arm pain and not associated with exertion.  She had an ETT 11/2017 that was normal.  Her exertional tolerance was mildly impaired.  She was seen in the hospital 01/2021 with chest pain, nausea, and vomiting.  Cardiac enzymes were negative.  She had an echo that revealed LVEF 65% with moderate LVH and mid cavitary LVOT flow.  Gradient could not be fully assessed.  Nuclear stress test at that time was negative for ischemia. ? ?Lately she has been feeling well.  She has not had any recurrent chest pain.  Her breathing has been stable.  She has occasional swelling in her left ankle but no bilateral edema.  She denies orthopnea or PND.  Her daughter notes that she is sometimes short of breath when she first gets up or when she exerts herself.  She has not been getting any exercise despite the fact that her family encouraged her.  Her blood pressures at home have been in the 110s to 120s over 60s to 70s.  She is otherwise without complaint other than some occasional GI upset. ? ?Past Medical History:  ?Diagnosis Date  ? Anxiety   ? Arthritis   ? Atypical chest pain 11/12/2017  ? Breast cancer ([facility])   ? Essential hypertension 11/12/2017  ? Gout   ? Hypertension   ? dr Karlton Lemon  ? Primary localized osteoarthritis of left knee 01/26/2019  ? [facility])   ? tia    06  ? ? ?Past Surgical History:  ?Procedure Laterality Date  ? ABDOMINAL HYSTERECTOMY    ? BREAST BIOPSY Left 05/26/2013  ? Procedure: BREAST BIOPSY WITH NEEDLE  LOCALIZATION;  Surgeon: Stark Klein, MD;  Location: [facility];  Service: General;  Laterality: Left;  ? BREAST LUMPECTOMY Right 2014  ? CHOLECYSTECTOMY    ? PARTIAL MASTECTOMY WITH NEEDLE LOCALIZATION Right 05/26/2013  ? Procedure: PARTIAL MASTECTOMY WITH NEEDLE LOCALIZATION;  Surgeon: Stark Klein, MD;  Location: [facility];  Service: General;  Laterality: Right;  ? TOTAL KNEE ARTHROPLASTY Left 01/26/2019  ? Procedure: TOTAL KNEE ARTHROPLASTY;  Surgeon: Elsie Saas, MD;  Location: [facility];  Service: Orthopedics;  Laterality: Left;  ? ? ? ?Current Outpatient Medications  ?Medication Sig Dispense Refill  ? allopurinol (ZYLOPRIM) 100 MG tablet Take 1 tablet (100 mg total) by mouth daily. 90 tablet 1  ? ALPRAZolam (XANAX) 0.25 MG tablet Take 0.25 mg by mouth 2 (two) times daily as needed.    ? aspirin 81 MG EC tablet aspirin 81 mg tablet,delayed release ? TAKE 1 TABLET BY MOUTH ONCE DAILY SWALLOW WHOLE 90 tablet 1  ? Cholecalciferol (VITAMIN D PO) Take 2,000 Units by mouth daily.     ? colchicine 0.6 MG tablet Take 1 tablet (0.6 mg total) by mouth daily as needed (gout). 30 tablet 1  ? dapagliflozin propanediol (FARXIGA) 5 MG TABS tablet Take 1 tablet (5 mg total) by mouth daily. 90 tablet 3  ? hydrocortisone 2.5 % cream  hydrocortisone 2.5 % topical cream ? APPLY CREAM TO AFFECTED AREA TWICE DAILY ON FACE, 1 WEEK ON, OFF, AS NEEDED. CAN USE ON BODY AREAS 2 WEEKS ON, OFF, AS NEEDED.    ? losartan (COZAAR) 25 MG tablet Take 1 tablet (25 mg total) by mouth daily. 90 tablet 1  ? Magnesium 250 MG TABS Take 250 mg by mouth.     ? meloxicam (MOBIC) 15 MG tablet Take 1 tablet (15 mg total) by mouth daily. 30 tablet 1  ? pravastatin (PRAVACHOL) 40 MG tablet Take 1 tablet (40 mg total) by mouth every evening. 90 tablet 1  ? triamcinolone (KENALOG) 0.025 % ointment Apply topically.    ? ?Current Facility-Administered Medications  ?Medication Dose Route Frequency Provider Last Rate Last Admin  ? betamethasone acetate-betamethasone sodium  phosphate (CELESTONE) injection 3 mg  3 mg Intra-articular Once Edrick Kins, DPM      ? ? ?Allergies:   Codeine  ? ? ?Social History:  The patient  reports that she has never smoked. She has never used smokeless tobacco. She reports current alcohol use of about 1.0 standard drink per week. She reports that she does not use drugs.  ? ?Family History:  The patient's family history includes Dementia in her mother; Diabetes in her brother, brother, mother, and sister; Esophageal cancer in her brother; Heart attack in her father and mother; Hypertension in her sister; Kidney disease in her brother; Lung cancer in her brother; Prostate cancer in her brother.  ? ? ?ROS:  Please see the history of present illness.   Otherwise, review of systems are positive for none.   All other systems are reviewed and negative.  ? ? ?PHYSICAL EXAM: ?VS:  BP 124/88 (BP Location: Left Arm, Patient Position: Sitting, Cuff Size: Normal)   Pulse 86   Ht '5\' 1"'$  (1.549 m)   Wt 188 lb (85.3 kg)   SpO2 94%   BMI 35.52 kg/m?  , BMI Body mass index is 35.52 kg/m?. ?GENERAL:  Well appearing ?HEENT:  Pupils equal round and reactive, fundi not visualized, oral mucosa unremarkable ?NECK:  No jugular venous distention, waveform within normal limits, carotid upstroke brisk and symmetric, no bruits, no thyromegaly ?LUNGS:  Clear to auscultation bilaterally ?HEART:  RRR.  PMI not displaced or sustained,S1 and S2 within normal limits, no S3, no S4, no clicks, no rubs, no murmurs ?ABD:  Flat, positive bowel sounds normal in frequency in pitch, no bruits, no rebound, no guarding, no midline pulsatile mass, no hepatomegaly, no splenomegaly ?EXT:  2 plus pulses throughout, no edema, no cyanosis no clubbing ?SKIN:  No rashes no nodules ?NEURO:  Cranial nerves II through XII grossly intact, motor grossly intact throughout ?PSYCH:  Cognitively intact, oriented to person place and time ? ? ?EKG:  EKG is not ordered today. ?The ekg ordered 11/12/2017  demonstrates sinus rhythm.  Rate 66 bpm.  ? ?ETT 11/2017: ?Blood pressure demonstrated a normal response to exercise. ?There was no ST segment deviation noted during stress. ?  ?1. Mildly impaired exercise tolerance.  ?2. No evidence for ischemia by ST segment analysis.  ?  ?Echo 01/2021: ?1. Left ventricular ejection fraction, by estimation, is 65%. The left  ?ventricle has normal function. The left ventricle has no regional wall  ?motion abnormalities. There is moderate left ventricular hypertrophy with  ?midcavitary and LVOT color flow  ?acceleration. Doppler alignment suboptimal for assessment of LVOT or mid  ?cavitary obstruction. Left ventricular diastolic function was not fully  ?  assessed.  ? 2. Right ventricular systolic function is normal. The right ventricular  ?size is normal. There is normal pulmonary artery systolic pressure. The  ?estimated right ventricular systolic pressure is 04.8 mmHg.  ? 3. The mitral valve is grossly normal. No evidence of mitral valve  ?regurgitation. No evidence of mitral stenosis.  ? 4. The aortic valve is normal in structure. There is mild calcification  ?of the aortic valve. Aortic valve regurgitation is not visualized. No  ?aortic stenosis is present.  ? 5. The inferior vena cava is normal in size with greater than 50%  ?respiratory variability, suggesting right atrial pressure of 3 mmHg.  ? 6. Increased flow velocities may be secondary to anemia, thyrotoxicosis,  ?hyperdynamic or high flow state.  ? ?Lexiscan Myoview 01/2021: ?IMPRESSION: ?1. No reversible ischemia or infarction. ?  ?2. Normal left ventricular wall motion. ?  ?3. Left ventricular ejection fraction 71% ?  ?4. Non invasive risk stratification*: Low ? ? ?Recent Labs: ?02/11/2021: TSH 1.291 ?08/22/2021: ALT 16; BUN 15; Creatinine, Ser 1.08; Hemoglobin 13.2; Magnesium 2.2; Platelets 341; Potassium CANCELED; Sodium CANCELED  ? ? ?Lipid Panel ?   ?Component Value Date/Time  ? CHOL 165 02/10/2021 0500  ? TRIG 105  02/10/2021 0500  ? HDL 39 (L) 02/10/2021 0500  ? CHOLHDL 4.2 02/10/2021 0500  ? VLDL 21 02/10/2021 0500  ? [place] 105 (H) 02/10/2021 0500  ? LDLDIRECT 96 08/22/2021 1112  ? ?  ? ?Wt Readings from Last 3 Encounters:  ?09/05/21 1

## 2021-09-06 LAB — BASIC METABOLIC PANEL
BUN/Creatinine Ratio: 19 (ref 12–28)
BUN: 20 mg/dL (ref 8–27)
CO2: 23 mmol/L (ref 20–29)
Calcium: 9.4 mg/dL (ref 8.7–10.3)
Chloride: 107 mmol/L — ABNORMAL HIGH (ref 96–106)
Creatinine, Ser: 1.05 mg/dL — ABNORMAL HIGH (ref 0.57–1.00)
Glucose: 101 mg/dL — ABNORMAL HIGH (ref 70–99)
Potassium: 4.7 mmol/L (ref 3.5–5.2)
Sodium: 143 mmol/L (ref 134–144)
eGFR: 53 mL/min/{1.73_m2} — ABNORMAL LOW (ref >59)

## 2021-09-14 ENCOUNTER — Telehealth (HOSPITAL_BASED_OUTPATIENT_CLINIC_OR_DEPARTMENT_OTHER): Payer: Self-pay

## 2021-09-14 NOTE — Telephone Encounter (Signed)
Received fax from [employer] that Casey Taylor was approved. This was given to Casey Taylor at the front desk to scan in the patients chart ?

## 2021-10-06 DIAGNOSIS — Z20822 Contact with and (suspected) exposure to covid-19: Secondary | ICD-10-CM | POA: Diagnosis not present

## 2021-11-15 ENCOUNTER — Other Ambulatory Visit: Payer: Self-pay | Admitting: Nurse Practitioner

## 2021-11-15 DIAGNOSIS — Z1231 Encounter for screening mammogram for malignant neoplasm of breast: Secondary | ICD-10-CM

## 2021-11-23 ENCOUNTER — Ambulatory Visit
Admission: RE | Admit: 2021-11-23 | Discharge: 2021-11-23 | Disposition: A | Discharge status: Home / Self Care | Payer: Medicare Other | Source: Ambulatory Visit | Attending: Nurse Practitioner | Admitting: Nurse Practitioner

## 2021-11-23 DIAGNOSIS — Z1231 Encounter for screening mammogram for malignant neoplasm of breast: Secondary | ICD-10-CM | POA: Diagnosis not present

## 2022-02-22 ENCOUNTER — Encounter (HOSPITAL_BASED_OUTPATIENT_CLINIC_OR_DEPARTMENT_OTHER): Payer: Self-pay | Admitting: Nurse Practitioner

## 2022-02-22 ENCOUNTER — Ambulatory Visit (INDEPENDENT_AMBULATORY_CARE_PROVIDER_SITE_OTHER): Payer: Medicare Other | Admitting: Nurse Practitioner

## 2022-02-22 VITALS — BP 129/61 | HR 66 | Ht 62.0 in | Wt 190.8 lb

## 2022-02-22 DIAGNOSIS — I10 Essential (primary) hypertension: Secondary | ICD-10-CM

## 2022-02-22 DIAGNOSIS — M1A00X0 Idiopathic chronic gout, unspecified site, without tophus (tophi): Secondary | ICD-10-CM

## 2022-02-22 DIAGNOSIS — E782 Mixed hyperlipidemia: Secondary | ICD-10-CM | POA: Diagnosis not present

## 2022-02-22 DIAGNOSIS — N1831 Chronic kidney disease, stage 3a: Secondary | ICD-10-CM | POA: Diagnosis not present

## 2022-02-22 DIAGNOSIS — F419 Anxiety disorder, unspecified: Secondary | ICD-10-CM | POA: Diagnosis not present

## 2022-02-22 DIAGNOSIS — Z23 Encounter for immunization: Secondary | ICD-10-CM

## 2022-02-22 DIAGNOSIS — R0602 Shortness of breath: Secondary | ICD-10-CM | POA: Diagnosis not present

## 2022-02-22 MED ORDER — LOSARTAN POTASSIUM 25 MG PO TABS: 25.0000 mg | ORAL_TABLET | Freq: Every day | ORAL | 3 refills | Status: DC

## 2022-02-22 MED ORDER — ALLOPURINOL 100 MG PO TABS: 100.0000 mg | ORAL_TABLET | Freq: Every day | ORAL | 3 refills | Status: DC

## 2022-02-22 MED ORDER — PRAVASTATIN SODIUM 40 MG PO TABS: 40.0000 mg | ORAL_TABLET | Freq: Every evening | ORAL | 3 refills | Status: DC

## 2022-02-22 MED ORDER — DAPAGLIFLOZIN PROPANEDIOL 5 MG PO TABS: 5.0000 mg | ORAL_TABLET | Freq: Every day | ORAL | 3 refills | Status: DC

## 2022-02-22 MED ORDER — ALPRAZOLAM 0.25 MG PO TABS: 0.2500 mg | ORAL_TABLET | Freq: Two times a day (BID) | ORAL | 2 refills | Status: DC | PRN

## 2022-02-22 NOTE — Progress Notes (Unsigned)
Worthy Keeler, DNP, AGNP-c [address] [place], [place] [address] [phone] Office [phone] Fax  ESTABLISHED PATIENT- Chronic Health and/or Follow-Up Visit  Blood pressure 129/61, pulse 66, height '5\' 2"'$  (1.575 m), weight 190 lb 12.8 oz (86.5 kg), SpO2 99 %.    Casey Taylor is a 86 y.o. year old female presenting today for evaluation and management of the following: Follow-up (Patient presents today for HTN follow up. She is feeling great. Daughter wants to know results of tens unit. She is due for flu shot and would like that today )   BRIEF HISTORY, IMPRESSION, RECOMMENDATIONS, and ROS  1. Essential hypertension 2. Mixed hyperlipidemia Current Medications and Doses: Losartan '25mg'$ , pravastatin '40mg'$  Satisfied with current treatment? yes Duration of hypertension: chronic Medication compliance: excellent compliance BP medication side effects: no Cholesterol medication side effects: no Cholesterol supplements: none Duration of hyperlipidemia: chronic Aspirin: yes Recent stressors: no Recurrent headaches: no Visual changes: no Palpitations: no Dyspnea: yes Chest pain: no Lower extremity edema: yes Dizzy/lightheaded: no She does report increased symptoms of shortness of breath with exertion and lower extremity edema of unknown etiology.  3. Chronic renal failure, stage 3a (HCC) CKD status: stable Medications renally dose: yes Previous renal evaluation: yes Pneumovax:   Patient reports completed- will obtain records Influenza Vaccine:   Given today Date of most recent labs: Any new concerns: LE edema and ShOB  4. Idiopathic chronic gout without tophus, unspecified site Stable with no concerns for active inflammation at this time. She would like a refill of her medication for prevention today.   5. Anxiety Current medications: alprazolam 0.'25mg'$  Medication side effects: none Status: controlled Duration:  chronic Anxious mood: no  Current symptoms:  none Since last evaluation: no change Suicidal ideations: no  Recent Stressors/Life Changes: No   All ROS negative with exception of what is listed above.   PHYSICAL EXAM Physical Exam Vitals and nursing note reviewed.  Constitutional:      General: She is not in acute distress.    Appearance: Normal appearance.  HENT:     Head: Normocephalic.  Eyes:     Extraocular Movements: Extraocular movements intact.     Conjunctiva/sclera: Conjunctivae normal.     Pupils: Pupils are equal, round, and reactive to light.  Neck:     Vascular: No carotid bruit.  Cardiovascular:     Rate and Rhythm: Normal rate and regular rhythm.     Pulses: Normal pulses.     Heart sounds: Normal heart sounds. No murmur heard. Pulmonary:     Effort: Pulmonary effort is normal.     Breath sounds: Normal breath sounds. No wheezing.  Abdominal:     General: Bowel sounds are normal. There is no distension.     Palpations: Abdomen is soft.     Tenderness: There is no abdominal tenderness. There is no guarding.  Musculoskeletal:        General: Normal range of motion.     Cervical back: Normal range of motion and neck supple.     Right lower leg: No edema.     Left lower leg: No edema.  Lymphadenopathy:     Cervical: No cervical adenopathy.  Skin:    General: Skin is warm and dry.     Capillary Refill: Capillary refill takes less than 2 seconds.  Neurological:     General: No focal deficit present.     Mental Status: She is alert and oriented to person, place,  and time.  Psychiatric:        Mood and Affect: Mood normal.        Behavior: Behavior normal.        Thought Content: Thought content normal.        Judgment: Judgment normal.     PLAN Problem List Items Addressed This Visit     Gout    Chronic. Well controlled at this time with no symptoms. Will send refills of medication for her today. Continue to monitor for new or worsening symptoms and  report any concerns. Follow-up as needed.       Relevant Medications   allopurinol (ZYLOPRIM) 100 MG tablet   Essential hypertension    Chronic. Well controlled at this time.  Patient is experiencing some LE edema and shortness of breath with exertion that are worse than typical baseline. It is unclear at this time if this is cardiac in nature or from other causes. We will plan to obtain labs today for evaluation. Her lungs are clear today with no signs of fluid volume overload present.  Refills provided on medication today.  Plan to follow-up in 6 months, as she is following closely with specialists. She is aware to contact the office if changes occur or sooner appt needed       Relevant Medications   losartan (COZAAR) 25 MG tablet   pravastatin (PRAVACHOL) 40 MG tablet   Other Relevant Orders   CBC With Diff/Platelet (Completed)   Comprehensive metabolic panel (Completed)   Lipid panel (Completed)   Anxiety    Chronic anxiety symptoms. Patient is tolerating the current treatment plan.  We will plan to Continue present medication(s) PDMP reviewed.    Self harm risk Minimal: No identifiable suicidal ideation.  Patients presenting with no risk factors but with morbid ruminations; may be classified as minimal risk based on the severity of the depressive symptoms today due to patient positive social support, positive therapeutic relationship, responsibility to others (children, family), coping skills and life satisfaction. Recommend:  - learn to notice warning signs of the anxiety getting worse - have a plan for how to handle bad days - practice relaxation or meditation daily - take medicine as prescribed for management of symptoms.  Follow-up in in 6 months, or sooner should symptoms worsen       Relevant Medications   ALPRAZolam (XANAX) 0.25 MG tablet   Mixed hyperlipidemia    Chronic. Previously controlled. Refills provided today. No alarm symptoms present.  Will obtain labs for  monitoring today. Recommend continuation of current medication regimen and heart healthy diet. Plan to follow-up in 6 months or sooner if new or worsening symptoms present.       Relevant Medications   losartan (COZAAR) 25 MG tablet   pravastatin (PRAVACHOL) 40 MG tablet   Other Relevant Orders   CBC With Diff/Platelet (Completed)   Comprehensive metabolic panel (Completed)   Lipid panel (Completed)   Chronic renal failure, stage 3a ([facility]) - Primary    Chronic. She is experiencing some worsening LE edema with unclear etiology at this time. Examination today is normal, which is reassuring. Consider possible worsening kidney function vs cardiac concerns contributing. At this time we will plan to monitor labs for evaluation. Continue with current medications and close monitoring with nephrology. Will plan to make changes as necessary based on lab findings.  We will plan to follow-up in 6 months or sooner if there are any new or changing concerns.  Relevant Medications   dapagliflozin propanediol (FARXIGA) 5 MG TABS tablet   Other Relevant Orders   CBC With Diff/Platelet (Completed)   Comprehensive metabolic panel (Completed)   Lipid panel (Completed)   Shortness of breath    Fairly new onset with unclear etiology at this time. No alarm symptoms are present today and examination is normal. She does have extensive cardiovascular and kidney history, therefore we will monitor labs today for signs of worsening conditions that could be contributing.  Consider alternatively the possibility of asthma/allergy exacerbation due to changing seasons and increased ragweed presence at this time.  If new or worsening symptoms develop, she is aware to seek immediate medical attention. I will make changes to the plan of care as necessary based on labs.  Follow-up with pulmonology and cardiology as scheduled.       Relevant Orders   B Nat Peptide (Completed)   Other Visit Diagnoses     Influenza  vaccine needed       Relevant Orders   Flu Vaccine QUAD 6+ mos PF IM (Fluarix Quad PF) (Completed)          Worthy Keeler, DNP, AGNP-c 02/22/2022 10:36 AM

## 2022-02-22 NOTE — Patient Instructions (Addendum)
I will plan to see you in six months and we can recheck everything.   You are looking great today. If you need anything please let me know.

## 2022-02-23 LAB — COMPREHENSIVE METABOLIC PANEL
ALT: 14 IU/L (ref 0–32)
AST: 19 IU/L (ref 0–40)
Albumin/Globulin Ratio: 1.9 (ref 1.2–2.2)
Albumin: 4.5 g/dL (ref 3.7–4.7)
Alkaline Phosphatase: 86 IU/L (ref 44–121)
BUN/Creatinine Ratio: 17 (ref 12–28)
BUN: 18 mg/dL (ref 8–27)
Bilirubin Total: 0.5 mg/dL (ref 0.0–1.2)
CO2: 22 mmol/L (ref 20–29)
Calcium: 9.6 mg/dL (ref 8.7–10.3)
Chloride: 106 mmol/L (ref 96–106)
Creatinine, Ser: 1.08 mg/dL — ABNORMAL HIGH (ref 0.57–1.00)
Globulin, Total: 2.4 g/dL (ref 1.5–4.5)
Glucose: 96 mg/dL (ref 70–99)
Potassium: 4.6 mmol/L (ref 3.5–5.2)
Sodium: 144 mmol/L (ref 134–144)
Total Protein: 6.9 g/dL (ref 6.0–8.5)
eGFR: 51 mL/min/{1.73_m2} — ABNORMAL LOW (ref >59)

## 2022-02-23 LAB — CBC WITH DIFF/PLATELET
Basophils Absolute: 0.1 10*3/uL (ref 0.0–0.2)
Basos: 1 %
EOS (ABSOLUTE): 0.2 10*3/uL (ref 0.0–0.4)
Eos: 4 %
Hematocrit: 42.3 % (ref 34.0–46.6)
Hemoglobin: 13.8 g/dL (ref 11.1–15.9)
Immature Grans (Abs): 0 10*3/uL (ref 0.0–0.1)
Immature Granulocytes: 0 %
Lymphocytes Absolute: 1.8 10*3/uL (ref 0.7–3.1)
Lymphs: 34 %
MCH: 30.1 pg (ref 26.6–33.0)
MCHC: 32.6 g/dL (ref 31.5–35.7)
MCV: 92 fL (ref 79–97)
Monocytes Absolute: 0.4 10*3/uL (ref 0.1–0.9)
Monocytes: 8 %
Neutrophils Absolute: 2.9 10*3/uL (ref 1.4–7.0)
Neutrophils: 53 %
Platelets: 292 10*3/uL (ref 150–450)
RBC: 4.59 x10E6/uL (ref 3.77–5.28)
RDW: 12.5 % (ref 11.7–15.4)
WBC: 5.4 10*3/uL (ref 3.4–10.8)

## 2022-02-23 LAB — LIPID PANEL
Chol/HDL Ratio: 3 ratio (ref 0.0–4.4)
Cholesterol, Total: 142 mg/dL (ref 100–199)
HDL: 48 mg/dL (ref >39)
LDL Chol Calc (NIH): 72 mg/dL (ref 0–99)
Triglycerides: 126 mg/dL (ref 0–149)
VLDL Cholesterol Cal: 22 mg/dL (ref 5–40)

## 2022-02-23 LAB — BRAIN NATRIURETIC PEPTIDE: BNP: 33.3 pg/mL (ref 0.0–100.0)

## 2022-02-27 DIAGNOSIS — R0601 Orthopnea: Secondary | ICD-10-CM | POA: Insufficient documentation

## 2022-02-27 DIAGNOSIS — R0602 Shortness of breath: Secondary | ICD-10-CM | POA: Insufficient documentation

## 2022-02-27 HISTORY — DX: Orthopnea: R06.01

## 2022-02-27 NOTE — Assessment & Plan Note (Signed)
Chronic. Previously controlled. Refills provided today. No alarm symptoms present.  Will obtain labs for monitoring today. Recommend continuation of current medication regimen and heart healthy diet. Plan to follow-up in 6 months or sooner if new or worsening symptoms present.

## 2022-02-27 NOTE — Assessment & Plan Note (Signed)
Fairly new onset with unclear etiology at this time. No alarm symptoms are present today and examination is normal. She does have extensive cardiovascular and kidney history, therefore we will monitor labs today for signs of worsening conditions that could be contributing.  Consider alternatively the possibility of asthma/allergy exacerbation due to changing seasons and increased ragweed presence at this time.  If new or worsening symptoms develop, she is aware to seek immediate medical attention. I will make changes to the plan of care as necessary based on labs.  Follow-up with pulmonology and cardiology as scheduled.

## 2022-02-27 NOTE — Assessment & Plan Note (Signed)
Chronic. She is experiencing some worsening LE edema with unclear etiology at this time. Examination today is normal, which is reassuring. Consider possible worsening kidney function vs cardiac concerns contributing. At this time we will plan to monitor labs for evaluation. Continue with current medications and close monitoring with nephrology. Will plan to make changes as necessary based on lab findings.  We will plan to follow-up in 6 months or sooner if there are any new or changing concerns.

## 2022-02-27 NOTE — Assessment & Plan Note (Signed)
Chronic. Well controlled at this time with no symptoms. Will send refills of medication for her today. Continue to monitor for new or worsening symptoms and report any concerns. Follow-up as needed.

## 2022-02-27 NOTE — Assessment & Plan Note (Signed)
Chronic. Well controlled at this time.  Patient is experiencing some LE edema and shortness of breath with exertion that are worse than typical baseline. It is unclear at this time if this is cardiac in nature or from other causes. We will plan to obtain labs today for evaluation. Her lungs are clear today with no signs of fluid volume overload present.  Refills provided on medication today.  Plan to follow-up in 6 months, as she is following closely with specialists. She is aware to contact the office if changes occur or sooner appt needed

## 2022-02-27 NOTE — Assessment & Plan Note (Addendum)
Chronic anxiety symptoms. Patient is tolerating the current treatment plan.  We will plan to Continue present medication(s) PDMP reviewed.    Self harm risk Minimal: No identifiable suicidal ideation.  Patients presenting with no risk factors but with morbid ruminations; may be classified as minimal risk based on the severity of the depressive symptoms today due to patient positive social support, positive therapeutic relationship, responsibility to others (children, family), coping skills and life satisfaction. Recommend:  - learn to notice warning signs of the anxiety getting worse - have a plan for how to handle bad days - practice relaxation or meditation daily - take medicine as prescribed for management of symptoms.  Follow-up in in 6 months, or sooner should symptoms worsen

## 2022-04-30 ENCOUNTER — Ambulatory Visit (INDEPENDENT_AMBULATORY_CARE_PROVIDER_SITE_OTHER): Payer: Medicare Other | Admitting: Nurse Practitioner

## 2022-04-30 ENCOUNTER — Encounter (HOSPITAL_BASED_OUTPATIENT_CLINIC_OR_DEPARTMENT_OTHER): Payer: Self-pay | Admitting: Nurse Practitioner

## 2022-04-30 VITALS — BP 148/79 | HR 78 | Ht 61.0 in | Wt 193.2 lb

## 2022-04-30 DIAGNOSIS — B9689 Other specified bacterial agents as the cause of diseases classified elsewhere: Secondary | ICD-10-CM | POA: Diagnosis not present

## 2022-04-30 DIAGNOSIS — J069 Acute upper respiratory infection, unspecified: Secondary | ICD-10-CM

## 2022-04-30 MED ORDER — HYDROCODONE BIT-HOMATROP MBR 5-1.5 MG/5ML PO SOLN: 5.0000 mL | Freq: Three times a day (TID) | ORAL | 0 refills | Status: DC | PRN

## 2022-04-30 MED ORDER — ALBUTEROL SULFATE HFA 108 (90 BASE) MCG/ACT IN AERS: 1.0000 | INHALATION_SPRAY | RESPIRATORY_TRACT | 0 refills | Status: DC | PRN

## 2022-04-30 MED ORDER — AZITHROMYCIN 250 MG PO TABS: ORAL_TABLET | ORAL | 0 refills | Status: AC

## 2022-04-30 MED ORDER — BENZONATATE 200 MG PO CAPS: 200.0000 mg | ORAL_CAPSULE | Freq: Three times a day (TID) | ORAL | 0 refills | Status: DC | PRN

## 2022-04-30 NOTE — Progress Notes (Signed)
  Orma Render, DNP, AGNP-c [facility] [address]  [place], [place] [address] [phone]  Subjective:   Casey Taylor is a 86 y.o. female presents to day for evaluation of: Cough (Short of breath)  Cough, congestion, shortness of breath x 4 days. She denies swelling or weight gain. She had similar symptoms prior to this but these did resolve then came back.   PMH, Medications, and Allergies reviewed and updated in chart as appropriate.   ROS negative except for what is listed in HPI. Objective:  BP (!) 148/79 (BP Location: Left Arm, Patient Position: Sitting)   Pulse 78   Ht '5\' 1"'$  (1.549 m)   Wt 193 lb 3.2 oz (87.6 kg)   SpO2 98%   BMI 36.50 kg/m  Physical Exam Vitals and nursing note reviewed.  Constitutional:      Appearance: She is ill-appearing.  HENT:     Head: Normocephalic.     Nose: Congestion present.     Mouth/Throat:     Mouth: Mucous membranes are moist.     Pharynx: Posterior oropharyngeal erythema present.  Eyes:     Pupils: Pupils are equal, round, and reactive to light.  Cardiovascular:     Rate and Rhythm: Normal rate and regular rhythm.     Pulses: Normal pulses.     Heart sounds: Normal heart sounds.  Pulmonary:     Effort: Pulmonary effort is normal. No respiratory distress.     Breath sounds: No stridor. Wheezing present. No rhonchi.  Abdominal:     General: Bowel sounds are normal. There is no distension.     Palpations: Abdomen is soft.  Musculoskeletal:     Right lower leg: No edema.     Left lower leg: No edema.  Lymphadenopathy:     Cervical: Cervical adenopathy present.  Skin:    General: Skin is warm and dry.     Capillary Refill: Capillary refill takes less than 2 seconds.  Neurological:     General: No focal deficit present.     Mental Status: She is alert.  Psychiatric:        Mood and Affect: Mood normal.           Assessment & Plan:   Problem List Items  Addressed This Visit     Bacterial upper respiratory infection - Primary    Symptoms and presentation consistent with URI, likely bacterial given the course. No signs of HF at this time. Will send treatment in inhaler, cough medication for night time use, and antibiotic therapy. She will follow-up if her symptoms worsen or fail to improve with antibiotic use.       Relevant Medications   HYDROcodone bit-homatropine (HYCODAN) 5-1.5 MG/5ML syrup   albuterol (VENTOLIN HFA) 108 (90 Base) MCG/ACT inhaler   benzonatate (TESSALON) 200 MG capsule      Orma Render, DNP, AGNP-c 06/15/2022  8:43 PM    History, Medications, Surgery, SDOH, and Family History reviewed and updated as appropriate.

## 2022-06-15 DIAGNOSIS — B9689 Other specified bacterial agents as the cause of diseases classified elsewhere: Secondary | ICD-10-CM

## 2022-06-15 HISTORY — DX: Other specified bacterial agents as the cause of diseases classified elsewhere: B96.89

## 2022-06-15 NOTE — Assessment & Plan Note (Signed)
Symptoms and presentation consistent with URI, likely bacterial given the course. No signs of HF at this time. Will send treatment in inhaler, cough medication for night time use, and antibiotic therapy. She will follow-up if her symptoms worsen or fail to improve with antibiotic use.

## 2022-08-15 DIAGNOSIS — H26491 Other secondary cataract, right eye: Secondary | ICD-10-CM | POA: Diagnosis not present

## 2022-08-15 DIAGNOSIS — H04123 Dry eye syndrome of bilateral lacrimal glands: Secondary | ICD-10-CM | POA: Diagnosis not present

## 2022-08-15 DIAGNOSIS — H2512 Age-related nuclear cataract, left eye: Secondary | ICD-10-CM | POA: Diagnosis not present

## 2022-08-15 DIAGNOSIS — Z961 Presence of intraocular lens: Secondary | ICD-10-CM | POA: Diagnosis not present

## 2022-08-15 DIAGNOSIS — H10413 Chronic giant papillary conjunctivitis, bilateral: Secondary | ICD-10-CM | POA: Diagnosis not present

## 2022-08-15 DIAGNOSIS — H43813 Vitreous degeneration, bilateral: Secondary | ICD-10-CM | POA: Diagnosis not present

## 2022-08-23 ENCOUNTER — Ambulatory Visit (HOSPITAL_BASED_OUTPATIENT_CLINIC_OR_DEPARTMENT_OTHER): Payer: Medicare Other | Admitting: Nurse Practitioner

## 2022-08-29 ENCOUNTER — Ambulatory Visit: Payer: Medicare Other | Admitting: Nurse Practitioner

## 2022-09-04 ENCOUNTER — Ambulatory Visit (INDEPENDENT_AMBULATORY_CARE_PROVIDER_SITE_OTHER): Payer: Medicare Other | Admitting: Family

## 2022-09-04 ENCOUNTER — Encounter (HOSPITAL_BASED_OUTPATIENT_CLINIC_OR_DEPARTMENT_OTHER): Payer: Self-pay | Admitting: Family

## 2022-09-04 VITALS — BP 124/82 | HR 81 | Ht 61.0 in | Wt 194.0 lb

## 2022-09-04 DIAGNOSIS — R0609 Other forms of dyspnea: Secondary | ICD-10-CM | POA: Diagnosis not present

## 2022-09-04 DIAGNOSIS — I10 Essential (primary) hypertension: Secondary | ICD-10-CM | POA: Diagnosis not present

## 2022-09-04 NOTE — Progress Notes (Unsigned)
Office Visit    Patient Name: Casey Taylor Date of Encounter: 09/05/2022  PCP:  Orma Render, NP   [place]  Cardiologist:  Skeet Latch, MD  Advanced Practice Provider:  No care team member to display Electrophysiologist:  None      Chief Complaint    Casey Taylor is a 87 y.o. female presents today for follow up of hypertension   Past Medical History    Past Medical History:  Diagnosis Date   Anxiety    Arthritis    Atypical chest pain 11/12/2017   Breast cancer ([place])    Essential hypertension 11/12/2017   Gout    Hypertension    dr shelton   Primary localized osteoarthritis of left knee 01/26/2019   Stroke [facility])    tia    06   Past Surgical History:  Procedure Laterality Date   ABDOMINAL HYSTERECTOMY     BREAST BIOPSY Left 05/26/2013   Procedure: BREAST BIOPSY WITH NEEDLE LOCALIZATION;  Surgeon: Stark Klein, MD;  Location: [place];  Service: General;  Laterality: Left;   BREAST LUMPECTOMY Right 2014   CHOLECYSTECTOMY     PARTIAL MASTECTOMY WITH NEEDLE LOCALIZATION Right 05/26/2013   Procedure: PARTIAL MASTECTOMY WITH NEEDLE LOCALIZATION;  Surgeon: Stark Klein, MD;  Location: [place];  Service: General;  Laterality: Right;   TOTAL KNEE ARTHROPLASTY Left 01/26/2019   Procedure: TOTAL KNEE ARTHROPLASTY;  Surgeon: Elsie Saas, MD;  Location: [facility];  Service: Orthopedics;  Laterality: Left;    Allergies  Allergies  Allergen Reactions   Codeine Nausea Only    History of Present Illness    Casey Taylor is a 87 y.o. female with a hx of hypertension, TIA, prior breast cancer s/p partial mastectomy, CKD 3 AA, anxiety, gout last seen 09/05/21 with Dr. Oval Linsey.  Initially evaluated May 2019 for shortness breath and chest discomfort.  Symptoms are found to be more related to arm pain and not associate with description.  ETT sick/2019 normal.  Seen in the hospital 01/2019 with chest pain, nausea, vomiting.  Cardiac  enzymes negative.  Echo with LVEF 65%, moderate LVH, mid cavitary LVOT flow.  Gradient could not be fully assessed.  Nuclear stress test at that time negative for ischemia.  Lysine 09/05/2021 by Dr. Oval Linsey doing overall well from a cardiac perspective.  She was recommended to increase her physical activity and no changes otherwise.  She presents today for follow-up with her daughter. Notes some exertional dyspnea with more than usual activity which is overall stable. SBP at home 117. Sometimes little lightheaded/dizzy when making quick position changes but not bothersome. Reports no chest pain, pressure, or tightness. No edema, orthopnea, PND. Reports no palpitations.  Has a foot pedals which she uses intermittently for exercise. Plans to start walking around neighborhood fpr more exercise.   EKGs/Labs/Other Studies Reviewed:   The following studies were reviewed today: Cardiac Studies & Procedures     STRESS TESTS  NM MYOCAR MULTI W/SPECT W 02/11/2021  Narrative CLINICAL DATA:  Chest pain.  EXAM: MYOCARDIAL IMAGING WITH SPECT (REST AND PHARMACOLOGIC-STRESS)  GATED LEFT VENTRICULAR WALL MOTION STUDY  LEFT VENTRICULAR EJECTION FRACTION  TECHNIQUE: Standard myocardial SPECT imaging was performed after resting intravenous injection of 11 mCi Tc-39m tetrofosmin. Subsequently, intravenous infusion of Lexiscan was performed under the supervision of the Cardiology staff. At peak effect of the drug, 30.2 mCi Tc-101m tetrofosmin was injected intravenously and standard myocardial SPECT imaging was performed. Quantitative gated imaging  was also performed to evaluate left ventricular wall motion, and estimate left ventricular ejection fraction.  COMPARISON:  None.  FINDINGS: Perfusion: No decreased activity in the left ventricle on stress imaging to suggest reversible ischemia or infarction.  Wall Motion: Normal left ventricular wall motion. No left ventricular dilation.  Left  Ventricular Ejection Fraction: 71 %  End diastolic volume 56 ml  End systolic volume 16 ml  IMPRESSION: 1. No reversible ischemia or infarction.  2. Normal left ventricular wall motion.  3. Left ventricular ejection fraction 71%  4. Non invasive risk stratification*: Low  *2012 Appropriate Use Criteria for Coronary Revascularization Focused Update: J Am Coll Cardiol. [id number]. http://content.airportbarriers.com.aspx?articleid=1201161   Electronically Signed By: Dorise Bullion III M.D. On: 02/11/2021 11:07   ECHOCARDIOGRAM  ECHOCARDIOGRAM COMPLETE 02/10/2021  Narrative ECHOCARDIOGRAM REPORT    Patient Name:   Casey Taylor Date of Exam: 02/10/2021 Medical Rec #:  [id number]             Height:       62.0 in Accession #:    [id number]            Weight:       192.2 lb Date of Birth:  Feb 12, 1936            BSA:          1.880 m Patient Age:    47 years              BP:           108/69 mmHg Patient Gender: F                     HR:           60 bpm. Exam Location:  Inpatient  Procedure: 2D Echo, Cardiac Doppler and Color Doppler  Indications:   R07.9* Chest pain, unspecified  History:       Patient has no prior history of Echocardiogram examinations. Angina.  Sonographer:   Tawnya Crook Referring      [place]:  IMPRESSIONS   1. Left ventricular ejection fraction, by estimation, is 65%. The left ventricle has normal function. The left ventricle has no regional wall motion abnormalities. There is moderate left ventricular hypertrophy with midcavitary and LVOT color flow acceleration. Doppler alignment suboptimal for assessment of LVOT or mid cavitary obstruction. Left ventricular diastolic function was not fully assessed. 2. Right ventricular systolic function is normal. The right ventricular size is normal. There is normal pulmonary artery systolic pressure. The estimated right ventricular systolic pressure is [name]  mmHg. 3. The mitral valve is grossly normal. No evidence of mitral valve regurgitation. No evidence of mitral stenosis. 4. The aortic valve is normal in structure. There is mild calcification of the aortic valve. Aortic valve regurgitation is not visualized. No aortic stenosis is present. 5. The inferior vena cava is normal in size with greater than 50% respiratory variability, suggesting right atrial pressure of 3 mmHg. 6. Increased flow velocities may be secondary to anemia, thyrotoxicosis, hyperdynamic or high flow state.  Conclusion(s)/Recommendation(s): Consider limited echo for evaluation for LVOT obstruction and complete assessment of diastolic function if clinically indicated.  FINDINGS Left Ventricle: Left ventricular ejection fraction, by estimation, is 65%. The left ventricle has normal function. The left ventricle has no regional wall motion abnormalities. The left ventricular internal cavity size was normal in size. There is moderate left ventricular hypertrophy. Left ventricular diastolic function could not be evaluated.  Right Ventricle: The right ventricular size is normal. No increase in right ventricular wall thickness. Right ventricular systolic function is normal. There is normal pulmonary artery systolic pressure. The tricuspid regurgitant velocity is 2.52 m/s, and with an assumed right atrial pressure of 3 mmHg, the estimated right ventricular systolic pressure is [name] mmHg.  Left Atrium: Left atrial size was normal in size.  Right Atrium: Right atrial size was normal in size.  Pericardium: There is no evidence of pericardial effusion.  Mitral Valve: The mitral valve is grossly normal. There is mild calcification of the mitral valve leaflet(s). No evidence of mitral valve regurgitation. No evidence of mitral valve stenosis.  Tricuspid Valve: The tricuspid valve is normal in structure. Tricuspid valve regurgitation is trivial. No evidence of tricuspid stenosis.  Aortic  Valve: The aortic valve is normal in structure. There is mild calcification of the aortic valve. Aortic valve regurgitation is not visualized. No aortic stenosis is present. Aortic valve mean gradient measures 5.0 mmHg. Aortic valve peak gradient measures 9.4 mmHg. Aortic valve area, by VTI measures 2.70 cm.  Pulmonic Valve: The pulmonic valve was normal in structure. Pulmonic valve regurgitation is trivial. No evidence of pulmonic stenosis.  Aorta: The aortic root is normal in size and structure.  Venous: The inferior vena cava is normal in size with greater than 50% respiratory variability, suggesting right atrial pressure of 3 mmHg.  IAS/Shunts: No atrial level shunt detected by color flow Doppler.   LEFT VENTRICLE PLAX 2D LVIDd:         3.30 cm  Diastology LVIDs:         2.30 cm  LV e' medial:  5.00 cm/s LV PW:         1.30 cm  LV e' lateral: 7.94 cm/s LV IVS:        1.30 cm LVOT diam:     2.00 cm LV SV:         86 LV SV Index:   46 LVOT Area:     3.14 cm   RIGHT VENTRICLE             IVC RV S prime:     14.00 cm/s  IVC diam: 1.10 cm  LEFT ATRIUM             Index LA diam:        2.70 cm 1.44 cm/m LA Vol (A2C):   18.7 ml 9.95 ml/m LA Vol (A4C):   17.9 ml 9.52 ml/m LA Biplane Vol: 20.2 ml 10.75 ml/m AORTIC VALVE                    PULMONIC VALVE AV Area (Vmax):    3.02 cm     PV Vmax:       0.93 m/s AV Area (Vmean):   2.97 cm     PV Peak grad:  3.5 mmHg AV Area (VTI):     2.70 cm AV Vmax:           153.00 cm/s AV Vmean:          109.000 cm/s AV VTI:            0.319 m AV Peak Grad:      9.4 mmHg AV Mean Grad:      5.0 mmHg LVOT Vmax:         147.00 cm/s LVOT Vmean:        103.000 cm/s LVOT VTI:  0.274 m LVOT/AV VTI ratio: 0.86  AORTA Ao Root diam: 3.00 cm Ao Asc diam:  3.40 cm  TRICUSPID VALVE TV Peak grad:   20.8 mmHg TV Vmax:        2.28 m/s TR Peak grad:   25.4 mmHg TR Vmax:        252.00 cm/s  SHUNTS Systemic VTI:  0.27 m Systemic  Diam: 2.00 cm  Cherlynn Kaiser MD Electronically signed by Cherlynn Kaiser MD Signature Date/Time: 02/10/2021/5:02:42 PM    Final              EKG:  EKG is  ordered today.  The ekg ordered today demonstrates NSR 81 bpm with no acute ST/T wave changes.   Recent Labs: 02/22/2022: ALT 14; BNP 33.3; BUN 18; Creatinine, Ser 1.08; Hemoglobin 13.8; Platelets 292; Potassium 4.6; Sodium 144  Recent Lipid Panel    Component Value Date/Time   CHOL 142 02/22/2022 1137   TRIG 126 02/22/2022 1137   HDL 48 02/22/2022 1137   CHOLHDL 3.0 02/22/2022 1137   CHOLHDL 4.2 02/10/2021 0500   VLDL 21 02/10/2021 0500   LDLCALC 72 02/22/2022 1137   LDLDIRECT 96 08/22/2021 1112   Home Medications   Current Meds  Medication Sig   albuterol (VENTOLIN HFA) 108 (90 Base) MCG/ACT inhaler Inhale 1-2 puffs into the lungs every 4 (four) hours as needed for wheezing or shortness of breath.   allopurinol (ZYLOPRIM) 100 MG tablet Take 1 tablet (100 mg total) by mouth daily.   ALPRAZolam (XANAX) 0.25 MG tablet Take 1 tablet (0.25 mg total) by mouth 2 (two) times daily as needed.   aspirin 81 MG EC tablet aspirin 81 mg tablet,delayed release  TAKE 1 TABLET BY MOUTH ONCE DAILY SWALLOW WHOLE   benzonatate (TESSALON) 200 MG capsule Take 1 capsule (200 mg total) by mouth 3 (three) times daily as needed for cough.   Cholecalciferol (VITAMIN D PO) Take 2,000 Units by mouth daily.    colchicine 0.6 MG tablet Take 1 tablet (0.6 mg total) by mouth daily as needed (gout).   dapagliflozin propanediol (FARXIGA) 5 MG TABS tablet Take 1 tablet (5 mg total) by mouth daily.   HYDROcodone bit-homatropine (HYCODAN) 5-1.5 MG/5ML syrup Take 5 mLs by mouth every 8 (eight) hours as needed for cough. Will make you sleepy. Do not drive while taking this medication.   hydrocortisone 2.5 % cream hydrocortisone 2.5 % topical cream  APPLY CREAM TO AFFECTED AREA TWICE DAILY ON FACE, 1 WEEK ON, OFF, AS NEEDED. CAN USE ON BODY AREAS 2 WEEKS ON,  OFF, AS NEEDED.   losartan (COZAAR) 25 MG tablet Take 1 tablet (25 mg total) by mouth daily.   Magnesium 250 MG TABS Take 250 mg by mouth.    meloxicam (MOBIC) 15 MG tablet Take 1 tablet (15 mg total) by mouth daily.   pravastatin (PRAVACHOL) 40 MG tablet Take 1 tablet (40 mg total) by mouth every evening.   triamcinolone (KENALOG) 0.025 % ointment Apply topically.   Current Facility-Administered Medications for the 09/04/22 encounter (Office Visit) with Loel Dubonnet, NP  Medication   betamethasone acetate-betamethasone sodium phosphate (CELESTONE) injection 3 mg     Review of Systems      All other systems reviewed and are otherwise negative except as noted above.  Physical Exam    VS:  BP 124/82   Pulse 81   Ht 5\' 1"  (1.549 m)   Wt 194 lb (88 kg)   BMI 36.66 kg/m  ,  BMI Body mass index is 36.66 kg/m.  Wt Readings from Last 3 Encounters:  09/04/22 194 lb (88 kg)  04/30/22 193 lb 3.2 oz (87.6 kg)  02/22/22 190 lb 12.8 oz (86.5 kg)     GEN: Well nourished, well developed, in no acute distress. HEENT: normal. Neck: Supple, no JVD, carotid bruits, or masses. Cardiac: RRR, no murmurs, rubs, or gallops. No clubbing, cyanosis, edema.  Radials/PT 2+ and equal bilaterally.  Respiratory:  Respirations regular and unlabored, clear to auscultation bilaterally. GI: Soft, nontender, nondistended. MS: No deformity or atrophy. Skin: Warm and dry, no rash. Neuro:  Strength and sensation are intact. Psych: Normal affect.  Assessment & Plan    Chest pain - No recurrence. EKG today NSR with no acute ST/T wave changes. No indication for ischemic evaluation.  HTN - BP well controlled with home readings routinely in the 110s. BP in clinic 130/92 with repeat 124/82 without intervention. Continue current antihypertensive regimen.  Discussed to monitor BP at home at least 2 hours after medications and sitting for 5-10 minutes. Heart healthy diet and regular cardiovascular exercise  encouraged.   Exertional dyspnea - Mild, stable over the last year. Likely related to obesity, deconditioning. Using pedals for exercise and plans to start walking.          Disposition: Follow up in 1 year(s) with Skeet Latch, MD or APP.  Signed, Loel Dubonnet, NP 09/05/2022, 11:34 AM [place]

## 2022-09-04 NOTE — Patient Instructions (Signed)
Medication Instructions:  Your physician recommends that you continue on your current medications as directed. Please refer to the Current Medication list given to you today.  *If you need a refill on your cardiac medications before your next appointment, please call your pharmacy*  Follow-Up: At [facility], you and your health needs are our priority.  As part of our continuing mission to provide you with exceptional heart care, we have created designated Provider Care Teams.  These Care Teams include your primary Cardiologist (physician) and Advanced Practice Providers (APPs -  Physician Assistants and Nurse Practitioners) who all work together to provide you with the care you need, when you need it.  We recommend signing up for the patient portal called "MyChart".  Sign up information is provided on this After Visit Summary.  MyChart is used to connect with patients for Virtual Visits (Telemedicine).  Patients are able to view lab/test results, encounter notes, upcoming appointments, etc.  Non-urgent messages can be sent to your provider as well.   To learn more about what you can do with MyChart, go to [email or web address].    Your next appointment:   1 year(s)  Provider:   Skeet Latch, MD

## 2022-09-05 ENCOUNTER — Encounter (HOSPITAL_BASED_OUTPATIENT_CLINIC_OR_DEPARTMENT_OTHER): Payer: Self-pay | Admitting: Family

## 2022-09-11 ENCOUNTER — Ambulatory Visit: Payer: Medicare Other | Admitting: Nurse Practitioner

## 2022-09-18 ENCOUNTER — Telehealth: Payer: Self-pay | Admitting: Nurse Practitioner

## 2022-09-18 NOTE — Telephone Encounter (Signed)
Contacted Khandice Eckersley Spraker to schedule their annual wellness visit. Appointment made for 09/25/22.  Shirlean Mylar [phone]

## 2022-09-25 ENCOUNTER — Ambulatory Visit (INDEPENDENT_AMBULATORY_CARE_PROVIDER_SITE_OTHER): Payer: Medicare Other

## 2022-09-25 VITALS — Ht 61.0 in | Wt 183.0 lb

## 2022-09-25 DIAGNOSIS — Z0000 Encounter for general adult medical examination without abnormal findings: Secondary | ICD-10-CM

## 2022-09-25 NOTE — Progress Notes (Signed)
I connected with  Casey Taylor on 09/25/22 by a audio enabled telemedicine application and verified that I am speaking with the correct person using two identifiers.  Patient Location: Home  Provider Location: Office/Clinic  I discussed the limitations of evaluation and management by telemedicine. The patient expressed understanding and agreed to proceed.  Subjective:   Casey Partridgeatricia Ann Welsch is a 87 y.o. female who presents for an Initial Medicare Annual Wellness Visit.  Review of Systems     Cardiac Risk Factors include: advanced age (>[id number], 68>65 women);dyslipidemia;hypertension;obesity (BMI >30kg/m2)     Objective:    Today's Vitals   09/25/22 0949  Weight: 183 lb (83 kg)  Height: 5\' 1"  (1.549 m)   Body mass index is 34.58 kg/m.     09/25/2022    9:53 AM 02/10/2021    6:14 AM 01/26/2019   10:00 AM 01/20/2019    1:38 PM 04/18/2016    3:31 PM 07/08/2015    9:50 AM 10/11/2014   11:58 AM  Advanced Directives  Does Patient Have a Medical Advance Directive? Yes Yes Yes Yes No No Yes  Type of Estate agentAdvance Directive Healthcare Power of [place];Living will Living will Healthcare Power of [place];Living will Healthcare Power of [place]orney;Living will   Healthcare Power of [place];Living will  Does patient want to make changes to medical advance directive?  No - Patient declined No - Patient declined No - Patient declined     Copy of Healthcare Power of Attorney in Chart? No - copy requested   No - copy requested   No - copy requested  Would patient like information on creating a medical advance directive?      No - patient declined information     Current Medications (verified) Outpatient Encounter Medications as of 09/25/2022  Medication Sig   albuterol (VENTOLIN HFA) 108 (90 Base) MCG/ACT inhaler Inhale 1-2 puffs into the lungs every 4 (four) hours as needed for wheezing or shortness of breath.   allopurinol (ZYLOPRIM) 100 MG tablet Take 1 tablet (100 mg total) by mouth daily.    ALPRAZolam (XANAX) 0.25 MG tablet Take 1 tablet (0.25 mg total) by mouth 2 (two) times daily as needed.   aspirin 81 MG EC tablet aspirin 81 mg tablet,delayed release  TAKE 1 TABLET BY MOUTH ONCE DAILY SWALLOW WHOLE   benzonatate (TESSALON) 200 MG capsule Take 1 capsule (200 mg total) by mouth 3 (three) times daily as needed for cough.   Cholecalciferol (VITAMIN D PO) Take 2,000 Units by mouth daily.    colchicine 0.6 MG tablet Take 1 tablet (0.6 mg total) by mouth daily as needed (gout).   dapagliflozin propanediol (FARXIGA) 5 MG TABS tablet Take 1 tablet (5 mg total) by mouth daily.   HYDROcodone bit-homatropine (HYCODAN) 5-1.5 MG/5ML syrup Take 5 mLs by mouth every 8 (eight) hours as needed for cough. Will make you sleepy. Do not drive while taking this medication.   hydrocortisone 2.5 % cream hydrocortisone 2.5 % topical cream  APPLY CREAM TO AFFECTED AREA TWICE DAILY ON FACE, 1 WEEK ON, OFF, AS NEEDED. CAN USE ON BODY AREAS 2 WEEKS ON, OFF, AS NEEDED.   losartan (COZAAR) 25 MG tablet Take 1 tablet (25 mg total) by mouth daily.   Magnesium 250 MG TABS Take 250 mg by mouth.    meloxicam (MOBIC) 15 MG tablet Take 1 tablet (15 mg total) by mouth daily.   pravastatin (PRAVACHOL) 40 MG tablet Take 1 tablet (40 mg total) by mouth every evening.  triamcinolone (KENALOG) 0.025 % ointment Apply topically.   Facility-Administered Encounter Medications as of 09/25/2022  Medication   betamethasone acetate-betamethasone sodium phosphate (CELESTONE) injection 3 mg    Allergies (verified) Codeine   History: Past Medical History:  Diagnosis Date   Anxiety    Arthritis    Atypical chest pain 11/12/2017   Breast cancer    Essential hypertension 11/12/2017   Gout    Hypertension    dr shelton   Primary localized osteoarthritis of left knee 01/26/2019   Stroke    tia    06   Past Surgical History:  Procedure Laterality Date   ABDOMINAL HYSTERECTOMY     BREAST BIOPSY Left 05/26/2013   Procedure:  BREAST BIOPSY WITH NEEDLE LOCALIZATION;  Surgeon: Almond Lint, MD;  Location: [facility] OR;  Service: General;  Laterality: Left;   BREAST LUMPECTOMY Right 2014   CHOLECYSTECTOMY     PARTIAL MASTECTOMY WITH NEEDLE LOCALIZATION Right 05/26/2013   Procedure: PARTIAL MASTECTOMY WITH NEEDLE LOCALIZATION;  Surgeon: Almond Lint, MD;  Location: [facility] OR;  Service: General;  Laterality: Right;   TOTAL KNEE ARTHROPLASTY Left 01/26/2019   Procedure: TOTAL KNEE ARTHROPLASTY;  Surgeon: Salvatore Marvel, MD;  Location: [facility];  Service: Orthopedics;  Laterality: Left;   Family History  Problem Relation Age of Onset   Heart attack Mother    Diabetes Mother    Dementia Mother    Heart attack Father    Hypertension Sister    Diabetes Sister    Diabetes Brother    Kidney disease Brother    Esophageal cancer Brother    Lung cancer Brother    Prostate cancer Brother    Diabetes Brother    Social History   Socioeconomic History   Marital status: Divorced    Spouse name: Not on file   Number of children: 5   Years of education: Not on file   Highest education level: Some college, no degree  Occupational History   Not on file  Tobacco Use   Smoking status: Never   Smokeless tobacco: Never  Vaping Use   Vaping Use: Never used  Substance and Sexual Activity   Alcohol use: Yes    Alcohol/week: 1.0 standard drink of alcohol    Types: 1 Glasses of wine per week    Comment: weekly   Drug use: No   Sexual activity: Not on file  Other Topics Concern   Not on file  Social History Narrative   Lives at home alone    Caffeine: coffee maybe 1 cup daily   Right handed   Social Determinants of Health   Financial Resource Strain: Low Risk  (09/25/2022)   Overall Financial Resource Strain (CARDIA)    Difficulty of Paying Living Expenses: Not hard at all  Food Insecurity: No Food Insecurity (09/25/2022)   Hunger Vital Sign    Worried About Running Out of Food in the Last Year: Never true    Ran Out of Food in  the Last Year: Never true  Transportation Needs: No Transportation Needs (09/25/2022)   PRAPARE - Administrator, Civil Service (Medical): No    Lack of Transportation (Non-Medical): No  Physical Activity: Inactive (09/25/2022)   Exercise Vital Sign    Days of Exercise per Week: 0 days    Minutes of Exercise per Session: 0 min  Stress: No Stress Concern Present (09/25/2022)   [employer] of Occupational Health - Occupational Stress Questionnaire    Feeling of Stress :  Not at all  Social Connections: Not on file    Tobacco Counseling Counseling given: Not Answered   Clinical Intake:  Pre-visit preparation completed: Yes  Pain : No/denies pain     Nutritional Status: BMI > 30  Obese Nutritional Risks: None Diabetes: No  How often do you need to have someone help you when you read instructions, pamphlets, or other written materials from your doctor or pharmacy?: 1 - Never  Diabetic? no  Interpreter Needed?: No  Information entered by :: NAllen LPN   Activities of Daily Living    09/25/2022    9:55 AM 02/22/2022   10:33 AM  In your present state of health, do you have any difficulty performing the following activities:  Hearing? 0 0  Vision? 1 0  Comment has cataracts   Difficulty concentrating or making decisions? 0 0  Walking or climbing stairs? 0 0  Dressing or bathing? 0 0  Doing errands, shopping? 0 0  Preparing Food and eating ? N   Using the Toilet? N   In the past six months, have you accidently leaked urine? N   Do you have problems with loss of bowel control? N   Managing your Medications? N   Managing your Finances? N   Housekeeping or managing your Housekeeping? N     Patient Care Team: Early, Sung Amabile, NP as PCP - General (Nurse Practitioner) Chilton Si, MD as PCP - Cardiology (Cardiology)  Indicate any recent Medical Services you may have received from other than [facility] providers in the past year (date may be approximate).      Assessment:   This is a routine wellness examination for [place].  Hearing/Vision screen Vision Screening - Comments:: Regular eye exams, [facility]  Dietary issues and exercise activities discussed: Current Exercise Habits: The patient does not participate in regular exercise at present   Goals Addressed             This Visit's Progress    Patient Stated       09/25/2022, no goals       Depression Screen    09/25/2022    9:54 AM 04/30/2022   11:12 AM 02/22/2022   10:33 AM 08/23/2021    1:28 PM  PHQ 2/9 Scores  PHQ - 2 Score 0 0 0 0  PHQ- 9 Score 0 1 0     Fall Risk    09/25/2022    9:54 AM 04/30/2022   11:11 AM 02/22/2022   10:32 AM 08/23/2021    1:28 PM  Fall Risk   Falls in the past year? 0 0 0 0  Number falls in past yr: 0  0 0  Injury with Fall? 0  0 0  Risk for fall due to : Medication side effect;Impaired mobility  No Fall Risks Orthopedic patient  Follow up Falls prevention discussed;Education provided;Falls evaluation completed  Falls evaluation completed;Education provided Falls evaluation completed;Education provided    FALL RISK PREVENTION PERTAINING TO THE HOME:  Any stairs in or around the home? No  If so, are there any without handrails? N/a Home free of loose throw rugs in walkways, pet beds, electrical cords, etc? Yes  Adequate lighting in your home to reduce risk of falls? Yes   ASSISTIVE DEVICES UTILIZED TO PREVENT FALLS:  Life alert? No  Use of a cane, walker or w/c? Yes  Grab bars in the bathroom? Yes  Shower chair or bench in shower? Yes  Elevated toilet seat or  a handicapped toilet? Yes   TIMED UP AND GO:  Was the test performed? No .      Cognitive Function:    02/29/2020    1:39 PM  MMSE - Mini Mental State Exam  Orientation to time 5  Orientation to Place 4  Registration 3  Attention/ Calculation 0  Recall 3  Language- name 2 objects 2  Language- repeat 1  Language- follow 3 step command 3  Language- read & follow  direction 1  Write a sentence 1  Copy design 0  Total score 23        09/25/2022    9:56 AM  6CIT Screen  What Year? 0 points  What month? 0 points  What time? 0 points  Count back from 20 0 points  Months in reverse 0 points  Repeat phrase 6 points  Total Score 6 points    Immunizations Immunization History  Administered Date(s) Administered   Influenza,inj,Quad PF,6+ Mos 02/22/2022   Influenza-Unspecified 02/16/2014, 02/16/2021   PFIZER Comirnaty(Gray Top)Covid-19 Tri-Sucrose Vaccine 09/27/2020   PFIZER(Purple Top)SARS-COV-2 Vaccination 03/25/2020   Pfizer Covid-19 Vaccine Bivalent Booster 60yrs & up 03/14/2021   Pneumococcal Conjugate-13 08/17/2011   Zoster Recombinat (Shingrix) 10/01/2019    TDAP status: Due, Education has been provided regarding the importance of this vaccine. Advised may receive this vaccine at local pharmacy or Health Dept. Aware to provide a copy of the vaccination record if obtained from local pharmacy or Health Dept. Verbalized acceptance and understanding.  Flu Vaccine status: Up to date  Pneumococcal vaccine status: Up to date  Covid-19 vaccine status: Completed vaccines  Qualifies for Shingles Vaccine? Yes   Zostavax completed No   Shingrix Completed?: No.    Education has been provided regarding the importance of this vaccine. Patient has been advised to call insurance company to determine out of pocket expense if they have not yet received this vaccine. Advised may also receive vaccine at local pharmacy or Health Dept. Verbalized acceptance and understanding.  Screening Tests Health Maintenance  Topic Date Due   DTaP/Tdap/Td (1 - Tdap) Never done   Pneumonia Vaccine 58+ Years old (2 of 2 - PPSV23 or PCV20) 10/12/2011   Medicare Annual Wellness (AWV)  07/19/2017   Zoster Vaccines- Shingrix (2 of 2) 11/26/2019   COVID-19 Vaccine (4 - 2023-24 season) 02/16/2022   INFLUENZA VACCINE  01/17/2023   DEXA SCAN  Completed   HPV VACCINES  Aged  Out    Health Maintenance  Health Maintenance Due  Topic Date Due   DTaP/Tdap/Td (1 - Tdap) Never done   Pneumonia Vaccine 70+ Years old (2 of 2 - PPSV23 or PCV20) 10/12/2011   Medicare Annual Wellness (AWV)  07/19/2017   Zoster Vaccines- Shingrix (2 of 2) 11/26/2019   COVID-19 Vaccine (4 - 2023-24 season) 02/16/2022    Colorectal cancer screening: No longer required.   Mammogram status: Completed 11/23/2021. Repeat every year  Bone Density status: Completed 07/24/2016.   Lung Cancer Screening: (Low Dose CT Chest recommended if Age 41-80 years, 30 pack-year currently smoking OR have quit w/in 15years.) does not qualify.   Lung Cancer Screening Referral: no  Additional Screening:  Hepatitis C Screening: does not qualify;   Vision Screening: Recommended annual ophthalmology exams for early detection of glaucoma and other disorders of the eye. Is the patient up to date with their annual eye exam?  Yes  Who is the provider or what is the name of the office in which the patient attends  annual eye exams? [facility] If pt is not established with a provider, would they like to be referred to a provider to establish care? No .   Dental Screening: Recommended annual dental exams for proper oral hygiene  Community Resource Referral / Chronic Care Management: CRR required this visit?  No   CCM required this visit?  No      Plan:     I have personally reviewed and noted the following in the patient's chart:   Medical and social history Use of alcohol, tobacco or illicit drugs  Current medications and supplements including opioid prescriptions. Patient is not currently taking opioid prescriptions. Functional ability and status Nutritional status Physical activity Advanced directives List of other physicians Hospitalizations, surgeries, and ER visits in previous 12 months Vitals Screenings to include cognitive, depression, and falls Referrals and appointments  In  addition, I have reviewed and discussed with patient certain preventive protocols, quality metrics, and best practice recommendations. A written personalized care plan for preventive services as well as general preventive health recommendations were provided to patient.     Barb Merino, LPN   06/23/1094   Nurse Notes: none  Due to this being a virtual visit, the after visit summary with patients personalized plan was offered to patient via mail or my-chart.  to pick up at office at next visit

## 2022-09-25 NOTE — Patient Instructions (Signed)
[name] , Thank you for taking time to come for your Medicare Wellness Visit. I appreciate your ongoing commitment to your health goals. Please review the following plan we discussed and let me know if I can assist you in the future.   These are the goals we discussed:  Goals      Patient Stated     09/25/2022, no goals        This is a list of the screening recommended for you and due dates:  Health Maintenance  Topic Date Due   DTaP/Tdap/Td vaccine (1 - Tdap) Never done   Pneumonia Vaccine (2 of 2 - PPSV23 or PCV20) 10/12/2011   Zoster (Shingles) Vaccine (2 of 2) 11/26/2019   COVID-19 Vaccine (4 - 2023-24 season) 02/16/2022   Flu Shot  01/17/2023   Medicare Annual Wellness Visit  09/25/2023   DEXA scan (bone density measurement)  Completed   HPV Vaccine  Aged Out    Advanced directives: Advance directive discussed with you today. .  Conditions/risks identified: none  Next appointment: Follow up in one year for your annual wellness visit    Preventive Care 65 Years and Older, Female Preventive care refers to lifestyle choices and visits with your health care provider that can promote health and wellness. What does preventive care include? A yearly physical exam. This is also called an annual well check. Dental exams once or twice a year. Routine eye exams. Ask your health care provider how often you should have your eyes checked. Personal lifestyle choices, including: Daily care of your teeth and gums. Regular physical activity. Eating a healthy diet. Avoiding tobacco and drug use. Limiting alcohol use. Practicing safe sex. Taking low-dose aspirin every day. Taking vitamin and mineral supplements as recommended by your health care provider. What happens during an annual well check? The services and screenings done by your health care provider during your annual well check will depend on your age, overall health, lifestyle risk factors, and family history of  disease. Counseling  Your health care provider may ask you questions about your: Alcohol use. Tobacco use. Drug use. Emotional well-being. Home and relationship well-being. Sexual activity. Eating habits. History of falls. Memory and ability to understand (cognition). Work and work Astronomer. Reproductive health. Screening  You may have the following tests or measurements: Height, weight, and BMI. Blood pressure. Lipid and cholesterol levels. These may be checked every 5 years, or more frequently if you are over 25 years old. Skin check. Lung cancer screening. You may have this screening every year starting at age 68 if you have a 30-pack-year history of smoking and currently smoke or have quit within the past 15 years. Fecal occult blood test (FOBT) of the stool. You may have this test every year starting at age 31. Flexible sigmoidoscopy or colonoscopy. You may have a sigmoidoscopy every 5 years or a colonoscopy every 10 years starting at age 58. Hepatitis C blood test. Hepatitis B blood test. Sexually transmitted disease (STD) testing. Diabetes screening. This is done by checking your blood sugar (glucose) after you have not eaten for a while (fasting). You may have this done every 1-3 years. Bone density scan. This is done to screen for osteoporosis. You may have this done starting at age 8. Mammogram. This may be done every 1-2 years. Talk to your health care provider about how often you should have regular mammograms. Talk with your health care provider about your test results, treatment options, and if necessary, the need  for more tests. Vaccines  Your health care provider may recommend certain vaccines, such as: Influenza vaccine. This is recommended every year. Tetanus, diphtheria, and acellular pertussis (Tdap, Td) vaccine. You may need a Td booster every 10 years. Zoster vaccine. You may need this after age 85. Pneumococcal 13-valent conjugate (PCV13) vaccine. One  dose is recommended after age 66. Pneumococcal polysaccharide (PPSV23) vaccine. One dose is recommended after age 18. Talk to your health care provider about which screenings and vaccines you need and how often you need them. This information is not intended to replace advice given to you by your health care provider. Make sure you discuss any questions you have with your health care provider. Document Released: 07/01/2015 Document Revised: 02/22/2016 Document Reviewed: 04/05/2015 Elsevier Interactive Patient Education  2017 [place] Prevention in the Home Falls can cause injuries. They can happen to people of all ages. There are many things you can do to make your home safe and to help prevent falls. What can I do on the outside of my home? Regularly fix the edges of walkways and driveways and fix any cracks. Remove anything that might make you trip as you walk through a door, such as a raised step or threshold. Trim any bushes or trees on the path to your home. Use bright outdoor lighting. Clear any walking paths of anything that might make someone trip, such as rocks or tools. Regularly check to see if handrails are loose or broken. Make sure that both sides of any steps have handrails. Any raised decks and porches should have guardrails on the edges. Have any leaves, snow, or ice cleared regularly. Use sand or salt on walking paths during winter. Clean up any spills in your garage right away. This includes oil or grease spills. What can I do in the bathroom? Use night lights. Install grab bars by the toilet and in the tub and shower. Do not use towel bars as grab bars. Use non-skid mats or decals in the tub or shower. If you need to sit down in the shower, use a plastic, non-slip stool. Keep the floor dry. Clean up any water that spills on the floor as soon as it happens. Remove soap buildup in the tub or shower regularly. Attach bath mats securely with double-sided  non-slip rug tape. Do not have throw rugs and other things on the floor that can make you trip. What can I do in the bedroom? Use night lights. Make sure that you have a light by your bed that is easy to reach. Do not use any sheets or blankets that are too big for your bed. They should not hang down onto the floor. Have a firm chair that has side arms. You can use this for support while you get dressed. Do not have throw rugs and other things on the floor that can make you trip. What can I do in the kitchen? Clean up any spills right away. Avoid walking on wet floors. Keep items that you use a lot in easy-to-reach places. If you need to reach something above you, use a strong step stool that has a grab bar. Keep electrical cords out of the way. Do not use floor polish or wax that makes floors slippery. If you must use wax, use non-skid floor wax. Do not have throw rugs and other things on the floor that can make you trip. What can I do with my stairs? Do not leave any items on the stairs.  Make sure that there are handrails on both sides of the stairs and use them. Fix handrails that are broken or loose. Make sure that handrails are as long as the stairways. Check any carpeting to make sure that it is firmly attached to the stairs. Fix any carpet that is loose or worn. Avoid having throw rugs at the top or bottom of the stairs. If you do have throw rugs, attach them to the floor with carpet tape. Make sure that you have a light switch at the top of the stairs and the bottom of the stairs. If you do not have them, ask someone to add them for you. What else can I do to help prevent falls? Wear shoes that: Do not have high heels. Have rubber bottoms. Are comfortable and fit you well. Are closed at the toe. Do not wear sandals. If you use a stepladder: Make sure that it is fully opened. Do not climb a closed stepladder. Make sure that both sides of the stepladder are locked into place. Ask  someone to hold it for you, if possible. Clearly mark and make sure that you can see: Any grab bars or handrails. First and last steps. Where the edge of each step is. Use tools that help you move around (mobility aids) if they are needed. These include: Canes. Walkers. Scooters. Crutches. Turn on the lights when you go into a dark area. Replace any light bulbs as soon as they burn out. Set up your furniture so you have a clear path. Avoid moving your furniture around. If any of your floors are uneven, fix them. If there are any pets around you, be aware of where they are. Review your medicines with your doctor. Some medicines can make you feel dizzy. This can increase your chance of falling. Ask your doctor what other things that you can do to help prevent falls. This information is not intended to replace advice given to you by your health care provider. Make sure you discuss any questions you have with your health care provider. Document Released: 03/31/2009 Document Revised: 11/10/2015 Document Reviewed: 07/09/2014 Elsevier Interactive Patient Education  2017 [employer].

## 2022-10-15 ENCOUNTER — Other Ambulatory Visit: Payer: Self-pay | Admitting: Nurse Practitioner

## 2022-10-15 DIAGNOSIS — Z1231 Encounter for screening mammogram for malignant neoplasm of breast: Secondary | ICD-10-CM

## 2022-10-22 ENCOUNTER — Ambulatory Visit (INDEPENDENT_AMBULATORY_CARE_PROVIDER_SITE_OTHER): Payer: Medicare Other | Admitting: Nurse Practitioner

## 2022-10-22 ENCOUNTER — Encounter: Payer: Self-pay | Admitting: Nurse Practitioner

## 2022-10-22 VITALS — BP 128/78 | HR 64 | Ht 64.0 in | Wt 193.4 lb

## 2022-10-22 DIAGNOSIS — R600 Localized edema: Secondary | ICD-10-CM

## 2022-10-22 DIAGNOSIS — I10 Essential (primary) hypertension: Secondary | ICD-10-CM | POA: Diagnosis not present

## 2022-10-22 DIAGNOSIS — E782 Mixed hyperlipidemia: Secondary | ICD-10-CM | POA: Diagnosis not present

## 2022-10-22 DIAGNOSIS — F419 Anxiety disorder, unspecified: Secondary | ICD-10-CM | POA: Diagnosis not present

## 2022-10-22 DIAGNOSIS — Z0000 Encounter for general adult medical examination without abnormal findings: Secondary | ICD-10-CM

## 2022-10-22 DIAGNOSIS — E559 Vitamin D deficiency, unspecified: Secondary | ICD-10-CM | POA: Diagnosis not present

## 2022-10-22 DIAGNOSIS — R21 Rash and other nonspecific skin eruption: Secondary | ICD-10-CM | POA: Diagnosis not present

## 2022-10-22 DIAGNOSIS — N1831 Chronic kidney disease, stage 3a: Secondary | ICD-10-CM | POA: Diagnosis not present

## 2022-10-22 DIAGNOSIS — K30 Functional dyspepsia: Secondary | ICD-10-CM

## 2022-10-22 DIAGNOSIS — M1A00X0 Idiopathic chronic gout, unspecified site, without tophus (tophi): Secondary | ICD-10-CM | POA: Diagnosis not present

## 2022-10-22 DIAGNOSIS — R0601 Orthopnea: Secondary | ICD-10-CM | POA: Diagnosis not present

## 2022-10-22 MED ORDER — ALLOPURINOL 100 MG PO TABS: 100.0000 mg | ORAL_TABLET | Freq: Every day | ORAL | 3 refills | Status: DC

## 2022-10-22 MED ORDER — PANTOPRAZOLE SODIUM 40 MG PO TBEC: 40.0000 mg | DELAYED_RELEASE_TABLET | Freq: Every day | ORAL | 3 refills | Status: DC

## 2022-10-22 MED ORDER — PRAVASTATIN SODIUM 40 MG PO TABS: 40.0000 mg | ORAL_TABLET | Freq: Every evening | ORAL | 3 refills | Status: DC

## 2022-10-22 MED ORDER — LOSARTAN POTASSIUM 25 MG PO TABS: 25.0000 mg | ORAL_TABLET | Freq: Every day | ORAL | 3 refills | Status: DC

## 2022-10-22 MED ORDER — DAPAGLIFLOZIN PROPANEDIOL 5 MG PO TABS: 5.0000 mg | ORAL_TABLET | Freq: Every day | ORAL | 3 refills | Status: DC

## 2022-10-22 MED ORDER — ALPRAZOLAM 0.25 MG PO TABS: 0.2500 mg | ORAL_TABLET | Freq: Two times a day (BID) | ORAL | 2 refills | Status: DC | PRN

## 2022-10-22 NOTE — Progress Notes (Signed)
Shawna Clamp, DNP, AGNP-c [facility] [address] [place], [place] [address] Main Office [phone]  BP 128/78   Pulse 64   Ht 5\' 4"  (1.626 m)   Wt 193 lb 6.4 oz (87.7 kg)   BMI 33.20 kg/m    Subjective:    Patient ID: Casey Taylor, female    DOB: 1936-03-14, 87 y.o.   MRN: [id number]  HPI: Casey Taylor is a 87 y.o. female presenting on 10/22/2022 for comprehensive medical examination.   Current medical concerns include: In addition to annual physical exam Elisia presents with concerns today for a "bubbly" sensation in her stomach, which she describes as nonpainful.  Despite using over-the-counter medication such as omeprazole, Pepto-Bismol, and Tums, the symptoms have not improved.    She reports she is scheduled for cataract surgery on her right eye next Tuesday morning.  She has previously undergone successful cataract surgery on the left eye a few years ago.  She will begin using prednisone and moxifloxacin eyedrops the night before surgery.  Kailynne discusses her vaccine history, noting that she has received the pneumonia vaccine PCV13 in 2013.  She is uncertain as to whether she needs another pneumonia vaccine now that she is over the age of 39.  She [facility].  She mentions experiencing shortness of breath occasionally, particularly after minimal exertion, such as walking from a store to her car.  The symptoms occasionally causes concern, though she does not report any associated cough, dizziness, chest pain, or weakness.  Regarding her medications, Jurnie conference daily use of allopurinol and vitamin D at a dose of 2000 units daily.  She did have some confusion and about the use of atorvastatin, which she had stopped taking after misinterpreting that the medication has been discontinued.  We have confirmed today that she should resume taking this.  Santeria does report an episode of a rash and swollen  lips which she treated with Benadryl.  She reports the rash began after exposure to grass and she feels the swollen lip was potentially due to seasoning at a hamburger place.  She has no known food allergies.  Pertinent items are noted in HPI.  IMMUNIZATIONS:   Flu: Flu vaccine postponed until flu season Prevnar 13: Prevnar 13 completed, documentation in chart Prevnar 20: Prevnar 20 N/A for this patient Pneumovax 23: Pneumovax completed, documentation needed Vac Shingrix: Shingrix completed, documentation needed (Dose # 1/2) HPV: HPV N/A for this patient Tetanus: Tetanus completed in the last 10 years COVID: COVID completed, documentation in chart   [facility] [address] for vaccine results.  HEALTH MAINTENANCE: Pap Smear HM Status: is not applicable for this patient Mammogram HM Status: is up to date Colon Cancer Screening HM Status: is up to date Bone Density HM Status: is up to date STI Testing HM Status: is not applicable for this patient Lung CT HM Status: is not applicable for this patient  She reports regular vision exams q1-5y: Yes  She reports regular dental exams q 82m:  Yes  The patient eats a regular, healthy diet. She endorses exercise and/or activity of: Sedentary   Most Recent Depression Screen:     10/22/2022   10:33 AM 09/25/2022    9:54 AM 04/30/2022   11:12 AM 02/22/2022   10:33 AM 08/23/2021    1:28 PM  Depression screen PHQ 2/9  Decreased Interest 0 0 0 0 0  Down, Depressed, Hopeless 0 0 0 0 0  PHQ -  2 Score 0 0 0 0 0  Altered sleeping  0 0 0   Tired, decreased energy  0 1 0   Change in appetite  0 0 0   Feeling bad or failure about yourself   0 0 0   Trouble concentrating  0 0 0   Moving slowly or fidgety/restless  0 0 0   Suicidal thoughts  0 0 0   PHQ-9 Score  0 1 0   Difficult doing work/chores  Not difficult at all Not difficult at all     Most Recent Anxiety Screen:     04/30/2022   11:12 AM  GAD 7 : Generalized Anxiety Score  Nervous,  Anxious, on Edge 0  Control/stop worrying 0  Worry too much - different things 0  Trouble relaxing 0  Restless 0  Easily annoyed or irritable 0  Afraid - awful might happen 0  Total GAD 7 Score 0  Anxiety Difficulty Not difficult at all   Most Recent Fall Screen:    10/22/2022   10:33 AM 09/25/2022    9:54 AM 04/30/2022   11:11 AM 02/22/2022   10:32 AM 08/23/2021    1:28 PM  Fall Risk   Falls in the past year? 0 0 0 0 0  Number falls in past yr: 0 0  0 0  Injury with Fall? 0 0  0 0  Risk for fall due to : No Fall Risks Medication side effect;Impaired mobility  No Fall Risks Orthopedic patient  Follow up Falls evaluation completed Falls prevention discussed;Education provided;Falls evaluation completed  Falls evaluation completed;Education provided Falls evaluation completed;Education provided    Past medical history, surgical history, medications, allergies, family history and social history reviewed with patient today and changes made to appropriate areas of the chart.  Past Medical History:  Past Medical History:  Diagnosis Date   Acute bilateral low back pain with right-sided sciatica 08/23/2021   Anxiety    Arthritis    Atypical chest pain 11/12/2017   Bacterial upper respiratory infection 06/15/2022   Breast cancer (HCC)    Essential hypertension 11/12/2017   Gout    Hypertension    dr shelton   Primary localized osteoarthritis of left knee 01/26/2019   Stroke [facility])    tia    06   Medications:  Current Outpatient Medications on File Prior to Visit  Medication Sig   albuterol (VENTOLIN HFA) 108 (90 Base) MCG/ACT inhaler Inhale 1-2 puffs into the lungs every 4 (four) hours as needed for wheezing or shortness of breath.   aspirin 81 MG EC tablet aspirin 81 mg tablet,delayed release  TAKE 1 TABLET BY MOUTH ONCE DAILY SWALLOW WHOLE   Cholecalciferol (VITAMIN D PO) Take 2,000 Units by mouth daily.    colchicine 0.6 MG tablet Take 1 tablet (0.6 mg total) by mouth daily as  needed (gout).   hydrocortisone 2.5 % cream hydrocortisone 2.5 % topical cream  APPLY CREAM TO AFFECTED AREA TWICE DAILY ON FACE, 1 WEEK ON, OFF, AS NEEDED. CAN USE ON BODY AREAS 2 WEEKS ON, OFF, AS NEEDED.   Magnesium 250 MG TABS Take 250 mg by mouth.    meloxicam (MOBIC) 15 MG tablet Take 1 tablet (15 mg total) by mouth daily.   triamcinolone (KENALOG) 0.025 % ointment Apply topically.   Current Facility-Administered Medications on File Prior to Visit  Medication   betamethasone acetate-betamethasone sodium phosphate (CELESTONE) injection 3 mg   Surgical History:  Past Surgical History:  Procedure  Laterality Date   ABDOMINAL HYSTERECTOMY     BREAST BIOPSY Left 05/26/2013   Procedure: BREAST BIOPSY WITH NEEDLE LOCALIZATION;  Surgeon: Almond Lint, MD;  Location: [facility] OR;  Service: General;  Laterality: Left;   BREAST LUMPECTOMY Right 2014   CHOLECYSTECTOMY     PARTIAL MASTECTOMY WITH NEEDLE LOCALIZATION Right 05/26/2013   Procedure: PARTIAL MASTECTOMY WITH NEEDLE LOCALIZATION;  Surgeon: Almond Lint, MD;  Location: [facility] OR;  Service: General;  Laterality: Right;   TOTAL KNEE ARTHROPLASTY Left 01/26/2019   Procedure: TOTAL KNEE ARTHROPLASTY;  Surgeon: Salvatore Marvel, MD;  Location: [facility] ORS;  Service: Orthopedics;  Laterality: Left;   Allergies:  Allergies  Allergen Reactions   Codeine Nausea Only   Family History:  Family History  Problem Relation Age of Onset   Heart attack Mother    Diabetes Mother    Dementia Mother    Heart attack Father    Hypertension Sister    Diabetes Sister    Diabetes Brother    Kidney disease Brother    Esophageal cancer Brother    Lung cancer Brother    Prostate cancer Brother    Diabetes Brother        Objective:    BP 128/78   Pulse 64   Ht 5\' 4"  (1.626 m)   Wt 193 lb 6.4 oz (87.7 kg)   BMI 33.20 kg/m   Wt Readings from Last 3 Encounters:  10/22/22 193 lb 6.4 oz (87.7 kg)  09/25/22 183 lb (83 kg)  09/04/22 194 lb (88 kg)    Physical  Exam Vitals and nursing note reviewed.  Constitutional:      General: She is not in acute distress.    Appearance: Normal appearance.  HENT:     Head: Normocephalic and atraumatic.     Right Ear: Hearing, tympanic membrane, ear canal and external ear normal.     Left Ear: Hearing, tympanic membrane, ear canal and external ear normal.     Nose: Nose normal.     Right Sinus: No maxillary sinus tenderness or frontal sinus tenderness.     Left Sinus: No maxillary sinus tenderness or frontal sinus tenderness.     Mouth/Throat:     Lips: Pink.     Mouth: Mucous membranes are moist.     Pharynx: Oropharynx is clear.  Eyes:     General: Lids are normal. Vision grossly intact.     Extraocular Movements: Extraocular movements intact.     Conjunctiva/sclera: Conjunctivae normal.     Pupils: Pupils are equal, round, and reactive to light.     Funduscopic exam:    Right eye: Red reflex present.        Left eye: Red reflex present.    Visual Fields: Right eye visual fields normal and left eye visual fields normal.  Neck:     Thyroid: No thyromegaly.     Vascular: No carotid bruit.  Cardiovascular:     Rate and Rhythm: Normal rate and regular rhythm.     Chest Wall: PMI is not displaced.     Pulses: Normal pulses.          Dorsalis pedis pulses are 2+ on the right side and 2+ on the left side.       Posterior tibial pulses are 2+ on the right side and 2+ on the left side.     Heart sounds: Normal heart sounds. No murmur heard. Pulmonary:     Effort: Pulmonary effort is normal. No respiratory  distress.     Breath sounds: Normal breath sounds.  Abdominal:     General: Abdomen is flat. Bowel sounds are normal. There is no distension.     Palpations: Abdomen is soft. There is no hepatomegaly, splenomegaly or mass.     Tenderness: There is no abdominal tenderness. There is no right CVA tenderness, left CVA tenderness, guarding or rebound.  Musculoskeletal:        General: Normal range of  motion.     Cervical back: Full passive range of motion without pain, normal range of motion and neck supple. No tenderness.     Right lower leg: Edema present.     Left lower leg: Edema present.  Feet:     Left foot:     Toenail Condition: Left toenails are normal.  Lymphadenopathy:     Cervical: No cervical adenopathy.     Upper Body:     Right upper body: No supraclavicular adenopathy.     Left upper body: No supraclavicular adenopathy.  Skin:    General: Skin is warm and dry.     Capillary Refill: Capillary refill takes less than 2 seconds.     Findings: Rash present.     Nails: There is no clubbing.     Comments: Feet are cool to touch.  Patient reports this is baseline. Definitive rash noted on the right lower extremity with clear evidence of no recurrence at the sock line, suggesting contact dermatitis.  Neurological:     General: No focal deficit present.     Mental Status: She is alert and oriented to person, place, and time.     GCS: GCS eye subscore is 4. GCS verbal subscore is 5. GCS motor subscore is 6.     Sensory: Sensation is intact.     Motor: Motor function is intact.     Coordination: Coordination is intact.     Gait: Gait is intact.     Deep Tendon Reflexes: Reflexes are normal and symmetric.  Psychiatric:        Attention and Perception: Attention normal.        Mood and Affect: Mood normal.        Speech: Speech normal.        Behavior: Behavior normal. Behavior is cooperative.        Thought Content: Thought content normal.        Cognition and Memory: Cognition and memory normal.        Judgment: Judgment normal.     Results for orders placed or performed in visit on 10/22/22  CBC with Differential/Platelet  Result Value Ref Range   WBC 5.8 3.4 - 10.8 x10E3/uL   RBC 4.46 3.77 - 5.28 x10E6/uL   Hemoglobin 13.6 11.1 - 15.9 g/dL   Hematocrit 16.1 09.6 - 46.6 %   MCV 91 79 - 97 fL   MCH 30.5 26.6 - 33.0 pg   MCHC 33.4 31.5 - 35.7 g/dL   RDW 04.5  40.9 - 81.1 %   Platelets 295 150 - 450 x10E3/uL   Neutrophils 55 Not Estab. %   Lymphs 33 Not Estab. %   Monocytes 8 Not Estab. %   Eos 3 Not Estab. %   Basos 1 Not Estab. %   Neutrophils Absolute 3.2 1.4 - 7.0 x10E3/uL   Lymphocytes Absolute 1.9 0.7 - 3.1 x10E3/uL   Monocytes Absolute 0.5 0.1 - 0.9 x10E3/uL   EOS (ABSOLUTE) 0.2 0.0 - 0.4 x10E3/uL   Basophils Absolute 0.1  0.0 - 0.2 x10E3/uL   Immature Granulocytes 0 Not Estab. %   Immature Grans (Abs) 0.0 0.0 - 0.1 x10E3/uL  Comprehensive metabolic panel  Result Value Ref Range   Glucose 95 70 - 99 mg/dL   BUN 22 8 - 27 mg/dL   Creatinine, Ser 1.61 0.57 - 1.00 mg/dL   eGFR 58 (L) >09 UE/AVW/0.98   BUN/Creatinine Ratio 23 12 - 28   Sodium 142 134 - 144 mmol/L   Potassium 4.7 3.5 - 5.2 mmol/L   Chloride 105 96 - 106 mmol/L   CO2 23 20 - 29 mmol/L   Calcium 9.4 8.7 - 10.3 mg/dL   Total Protein 6.8 6.0 - 8.5 g/dL   Albumin 4.3 3.7 - 4.7 g/dL   Globulin, Total 2.5 1.5 - 4.5 g/dL   Albumin/Globulin Ratio 1.7 1.2 - 2.2   Bilirubin Total 0.5 0.0 - 1.2 mg/dL   Alkaline Phosphatase 88 44 - 121 IU/L   AST 16 0 - 40 IU/L   ALT 13 0 - 32 IU/L  VITAMIN D 25 Hydroxy (Vit-D Deficiency, Fractures)  Result Value Ref Range   Vit D, 25-Hydroxy 34.4 30.0 - 100.0 ng/mL  Brain natriuretic peptide  Result Value Ref Range   BNP 42.1 0.0 - 100.0 pg/mL         Assessment & Plan:   Problem List Items Addressed This Visit     Gout    Chronic history of gout currently managed with allopurinol.  No alarm symptoms are present at this time.  Continue to monitor.      Relevant Medications   allopurinol (ZYLOPRIM) 100 MG tablet   Essential hypertension    Chronic hypertension with excellent control at this time.  She is currently managed with losartan and tolerating well.  Labs pending today.  No changes in plan of care at this time.      Relevant Medications   pravastatin (PRAVACHOL) 40 MG tablet   losartan (COZAAR) 25 MG tablet   Other  Relevant Orders   CBC with Differential/Platelet (Completed)   Comprehensive metabolic panel (Completed)   VITAMIN D 25 Hydroxy (Vit-D Deficiency, Fractures) (Completed)   Brain natriuretic peptide (Completed)   Anxiety    Chronic anxiety symptoms. Patient is tolerating the current treatment plan with as needed Xanax. We will plan to Continue present medication. PDMP reviewed.   No alarm symptoms are present today. Plan: - Xanax refills have been provided to ensure enough supply through her cruise in October. - Continue to monitor for new or worsening symptoms and report immediately. - Avoid daily use of Xanax to reduce the risk of dependency on medication.      Relevant Medications   ALPRAZolam (XANAX) 0.25 MG tablet   Lower leg edema    Bilateral lower extremity edema is present without pitting.  No alarm symptoms are present at this time.  Recommend elevation of lower extremities while seated, reduced sodium intake and at least 64 ounces of water daily.  Elevate legs while seated and consider use of compression stockings when standing for long periods of time or traveling.  Will monitor labs today.      Relevant Orders   CBC with Differential/Platelet (Completed)   Comprehensive metabolic panel (Completed)   VITAMIN D 25 Hydroxy (Vit-D Deficiency, Fractures) (Completed)   Brain natriuretic peptide (Completed)   Vitamin D deficiency, unspecified    Labs pending today.  No alarm symptoms present at this time.      Mixed hyperlipidemia  Chronic hyperlipidemia in the setting of hypertension and chronic kidney disease.  She is currently managed with pravastatin.  There was some confusion on whether to continue this medication.  I have recommended continuation of the medication at this time for optimal control.  She has expressed understanding and will plan to restart.  Labs pending today.  Continue with diet and exercise management as well.      Relevant Medications   pravastatin  (PRAVACHOL) 40 MG tablet   losartan (COZAAR) 25 MG tablet   Other Relevant Orders   CBC with Differential/Platelet (Completed)   Comprehensive metabolic panel (Completed)   VITAMIN D 25 Hydroxy (Vit-D Deficiency, Fractures) (Completed)   Brain natriuretic peptide (Completed)   Chronic renal failure, stage 3a (HCC)    CKD 3 A in the setting of hypertension and hyperlipidemia.  She currently has excellent control of her hypertension and is on [place] for protection.  Labs are pending today.      Relevant Medications   dapagliflozin propanediol (FARXIGA) 5 MG TABS tablet   Other Relevant Orders   CBC with Differential/Platelet (Completed)   Comprehensive metabolic panel (Completed)   VITAMIN D 25 Hydroxy (Vit-D Deficiency, Fractures) (Completed)   Brain natriuretic peptide (Completed)   Orthopnea    Reported symptoms of intermittent orthopnea with no significant alarm symptoms noted.  I suspect that this may be related to both deconditioning and possible allergy exacerbation.  Recommend continued close monitoring and follow-up if this continues or begins to worsen.      Relevant Orders   CBC with Differential/Platelet (Completed)   Comprehensive metabolic panel (Completed)   VITAMIN D 25 Hydroxy (Vit-D Deficiency, Fractures) (Completed)   Brain natriuretic peptide (Completed)   Encounter for annual physical exam - Primary    CPE today.  Labs pending. Will make changes as necessary based on results.  Review of HM activities and recommendations discussed and provided on AVS Anticipatory guidance, diet, and exercise recommendations provided.  Medications, allergies, and hx reviewed and updated as necessary.  Plan to f/u with CPE in 1 year or sooner for acute/chronic health needs as directed.        Indigestion    Symptoms present consistent with GERD.  She has had some improvement with over-the-counter medications. Plan: - Will prescribe pantoprazole for 30 days to see if this is  helpful for reduction of symptoms. - May continue over-the-counter medications following the completion of 30-day trial.  If symptoms return or persist recommend contacting the office for restart of pantoprazole if this is helpful.      Relevant Medications   pantoprazole (PROTONIX) 40 MG tablet   Rash and nonspecific skin eruption    Lower extremity rash with sharp delineation at the sock line suggestive of contact dermatitis.  Patient does endorse symptoms started after exposure to grass.  Recommend consideration of over-the-counter allergy medicine to help prevent future symptoms.  May also use topical low-dose steroid cream if itching or rash persist.  If no improvement follow-up.          Follow up plan: Return in about 6 months (around 04/24/2023).  NEXT PREVENTATIVE PHYSICAL DUE IN 1 YEAR.  PATIENT COUNSELING PROVIDED FOR ALL ADULT PATIENTS: A well balanced diet low in saturated fats, cholesterol, and moderation in carbohydrates.  This can be as simple as monitoring portion sizes and cutting back on sugary beverages such as soda and juice to start with.    Daily water consumption of at least 64 ounces.  Physical activity  at least 180 minutes per week.  If just starting out, start 10 minutes a day and work your way up.   This can be as simple as taking the stairs instead of the elevator and walking 2-3 laps around the office  purposefully every day.   STD protection, partner selection, and regular testing if high risk.  Limited consumption of alcoholic beverages if alcohol is consumed. For men, I recommend no more than 14 alcoholic beverages per week, spread out throughout the week (max 2 per day). Avoid "binge" drinking or consuming large quantities of alcohol in one setting.  Please let me know if you feel you may need help with reduction or quitting alcohol consumption.   Avoidance of nicotine, if used. Please let me know if you feel you may need help with reduction or  quitting nicotine use.   Daily mental health attention. This can be in the form of 5 minute daily meditation, prayer, journaling, yoga, reflection, etc.  Purposeful attention to your emotions and mental state can significantly improve your overall wellbeing  and  Health.  Please know that I am here to help you with all of your health care goals and am happy to work with you to find a solution that works best for you.  The greatest advice I have received with any changes in life are to take it one step at a time, that even means if all you can focus on is the next 60 seconds, then do that and celebrate your victories.  With any changes in life, you will have set backs, and that is OK. The important thing to remember is, if you have a set back, it is not a failure, it is an opportunity to try again! Screening Testing Mammogram Every 1 -2 years based on history and risk factors Starting at age 51 Pap Smear Ages 21-39 every 3 years Ages 62-65 every 5 years with HPV testing More frequent testing may be required based on results and history Colon Cancer Screening Every 1-10 years based on test performed, risk factors, and history Starting at age 49 Bone Density Screening Every 2-10 years based on history Starting at age 27 for women Recommendations for men differ based on medication usage, history, and risk factors AAA Screening One time ultrasound Men 2-68 years old who have every smoked Lung Cancer Screening Low Dose Lung CT every 12 months Age 30-80 years with a 30 pack-year smoking history who still smoke or who have quit within the last 15 years   Screening Labs Routine  Labs: Complete Blood Count (CBC), Complete Metabolic Panel (CMP), Cholesterol (Lipid Panel) Every 6-12 months based on history and medications May be recommended more frequently based on current conditions or previous results Hemoglobin A1c Lab Every 3-12 months based on history and previous results Starting at  age 78 or earlier with diagnosis of diabetes, high cholesterol, BMI >26, and/or risk factors Frequent monitoring for patients with diabetes to ensure blood sugar control Thyroid Panel (TSH) Every 6 months based on history, symptoms, and risk factors May be repeated more often if on medication HIV One time testing for all patients 24 and older May be repeated more frequently for patients with increased risk factors or exposure Hepatitis C One time testing for all patients 63 and older May be repeated more frequently for patients with increased risk factors or exposure Gonorrhea, Chlamydia Every 12 months for all sexually active persons 13-24 years Additional monitoring may be recommended for those who are  considered high risk or who have symptoms Every 12 months for any woman on birth control, regardless of sexual activity PSA Men 3-21 years old with risk factors Additional screening may be recommended from age 36-69 based on risk factors, symptoms, and history  Vaccine Recommendations Tetanus Booster All adults every 10 years Flu Vaccine All patients 6 months and older every year COVID Vaccine All patients 12 years and older Initial dosing with booster May recommend additional booster based on age and health history HPV Vaccine 2 doses all patients age 66-26 Dosing may be considered for patients over 26 Shingles Vaccine (Shingrix) 2 doses all adults 55 years and older Pneumonia (Pneumovax 3) All adults 65 years and older May recommend earlier dosing based on health history One year apart from Prevnar 32 Pneumonia (Prevnar 75) All adults 65 years and older Dosed 1 year after Pneumovax 23 Pneumonia (Prevnar 20) One time alternative to the two dosing of 13 and 23 For all adults with initial dose of 23, 20 is recommended 1 year later For all adults with initial dose of 13, 23 is still recommended as second option 1 year later

## 2022-10-22 NOTE — Patient Instructions (Addendum)
I would like you to work on walking at least 10 minutes every day. It doesn't have to be fast or for a long distance, but getting out and walking is very important.   Keep an eye on your rash and let me know if this doesn't go away.   I have sent in a new medication called Pantoprazole to help with the bubbles in your belly. If this does not get better with the medication, please let me know. If it works well I can send in a 90 day supply for you.

## 2022-10-23 LAB — CBC WITH DIFFERENTIAL/PLATELET
Basophils Absolute: 0.1 10*3/uL (ref 0.0–0.2)
Basos: 1 %
EOS (ABSOLUTE): 0.2 10*3/uL (ref 0.0–0.4)
Eos: 3 %
Hematocrit: 40.7 % (ref 34.0–46.6)
Hemoglobin: 13.6 g/dL (ref 11.1–15.9)
Immature Grans (Abs): 0 10*3/uL (ref 0.0–0.1)
Immature Granulocytes: 0 %
Lymphocytes Absolute: 1.9 10*3/uL (ref 0.7–3.1)
Lymphs: 33 %
MCH: 30.5 pg (ref 26.6–33.0)
MCHC: 33.4 g/dL (ref 31.5–35.7)
MCV: 91 fL (ref 79–97)
Monocytes Absolute: 0.5 10*3/uL (ref 0.1–0.9)
Monocytes: 8 %
Neutrophils Absolute: 3.2 10*3/uL (ref 1.4–7.0)
Neutrophils: 55 %
Platelets: 295 10*3/uL (ref 150–450)
RBC: 4.46 x10E6/uL (ref 3.77–5.28)
RDW: 12.1 % (ref 11.7–15.4)
WBC: 5.8 10*3/uL (ref 3.4–10.8)

## 2022-10-23 LAB — COMPREHENSIVE METABOLIC PANEL
ALT: 13 IU/L (ref 0–32)
AST: 16 IU/L (ref 0–40)
Albumin/Globulin Ratio: 1.7 (ref 1.2–2.2)
Albumin: 4.3 g/dL (ref 3.7–4.7)
Alkaline Phosphatase: 88 IU/L (ref 44–121)
BUN/Creatinine Ratio: 23 (ref 12–28)
BUN: 22 mg/dL (ref 8–27)
Bilirubin Total: 0.5 mg/dL (ref 0.0–1.2)
CO2: 23 mmol/L (ref 20–29)
Calcium: 9.4 mg/dL (ref 8.7–10.3)
Chloride: 105 mmol/L (ref 96–106)
Creatinine, Ser: 0.97 mg/dL (ref 0.57–1.00)
Globulin, Total: 2.5 g/dL (ref 1.5–4.5)
Glucose: 95 mg/dL (ref 70–99)
Potassium: 4.7 mmol/L (ref 3.5–5.2)
Sodium: 142 mmol/L (ref 134–144)
Total Protein: 6.8 g/dL (ref 6.0–8.5)
eGFR: 58 mL/min/{1.73_m2} — ABNORMAL LOW (ref >59)

## 2022-10-23 LAB — VITAMIN D 25 HYDROXY (VIT D DEFICIENCY, FRACTURES): Vit D, 25-Hydroxy: 34.4 ng/mL (ref 30.0–100.0)

## 2022-10-23 LAB — BRAIN NATRIURETIC PEPTIDE: BNP: 42.1 pg/mL (ref 0.0–100.0)

## 2022-10-30 DIAGNOSIS — H2512 Age-related nuclear cataract, left eye: Secondary | ICD-10-CM | POA: Diagnosis not present

## 2022-11-04 ENCOUNTER — Encounter: Payer: Self-pay | Admitting: Nurse Practitioner

## 2022-11-04 DIAGNOSIS — R21 Rash and other nonspecific skin eruption: Secondary | ICD-10-CM

## 2022-11-04 DIAGNOSIS — Z0000 Encounter for general adult medical examination without abnormal findings: Secondary | ICD-10-CM | POA: Insufficient documentation

## 2022-11-04 DIAGNOSIS — K30 Functional dyspepsia: Secondary | ICD-10-CM | POA: Insufficient documentation

## 2022-11-04 HISTORY — DX: Rash and other nonspecific skin eruption: R21

## 2022-11-04 NOTE — Assessment & Plan Note (Signed)
Chronic hypertension with excellent control at this time.  She is currently managed with losartan and tolerating well.  Labs pending today.  No changes in plan of care at this time.

## 2022-11-04 NOTE — Assessment & Plan Note (Signed)
Symptoms present consistent with GERD.  She has had some improvement with over-the-counter medications. Plan: - Will prescribe pantoprazole for 30 days to see if this is helpful for reduction of symptoms. - May continue over-the-counter medications following the completion of 30-day trial.  If symptoms return or persist recommend contacting the office for restart of pantoprazole if this is helpful.

## 2022-11-04 NOTE — Assessment & Plan Note (Signed)
Chronic anxiety symptoms. Patient is tolerating the current treatment plan with as needed Xanax. We will plan to Continue present medication. PDMP reviewed.   No alarm symptoms are present today. Plan: - Xanax refills have been provided to ensure enough supply through her cruise in October. - Continue to monitor for new or worsening symptoms and report immediately. - Avoid daily use of Xanax to reduce the risk of dependency on medication.

## 2022-11-04 NOTE — Assessment & Plan Note (Signed)
Bilateral lower extremity edema is present without pitting.  No alarm symptoms are present at this time.  Recommend elevation of lower extremities while seated, reduced sodium intake and at least 64 ounces of water daily.  Elevate legs while seated and consider use of compression stockings when standing for long periods of time or traveling.  Will monitor labs today.

## 2022-11-04 NOTE — Assessment & Plan Note (Signed)
Chronic history of gout currently managed with allopurinol.  No alarm symptoms are present at this time.  Continue to monitor.

## 2022-11-04 NOTE — Assessment & Plan Note (Signed)
Lower extremity rash with sharp delineation at the sock line suggestive of contact dermatitis.  Patient does endorse symptoms started after exposure to grass.  Recommend consideration of over-the-counter allergy medicine to help prevent future symptoms.  May also use topical low-dose steroid cream if itching or rash persist.  If no improvement follow-up.

## 2022-11-04 NOTE — Assessment & Plan Note (Signed)
Chronic hyperlipidemia in the setting of hypertension and chronic kidney disease.  She is currently managed with pravastatin.  There was some confusion on whether to continue this medication.  I have recommended continuation of the medication at this time for optimal control.  She has expressed understanding and will plan to restart.  Labs pending today.  Continue with diet and exercise management as well.

## 2022-11-04 NOTE — Assessment & Plan Note (Signed)
Labs pending today.  No alarm symptoms present at this time.

## 2022-11-04 NOTE — Assessment & Plan Note (Signed)
CPE today. Labs pending. Will make changes as necessary based on results.  Review of HM activities and recommendations discussed and provided on AVS Anticipatory guidance, diet, and exercise recommendations provided.  Medications, allergies, and hx reviewed and updated as necessary.  Plan to f/u with CPE in 1 year or sooner for acute/chronic health needs as directed.   

## 2022-11-04 NOTE — Assessment & Plan Note (Signed)
Reported symptoms of intermittent orthopnea with no significant alarm symptoms noted.  I suspect that this may be related to both deconditioning and possible allergy exacerbation.  Recommend continued close monitoring and follow-up if this continues or begins to worsen.

## 2022-11-04 NOTE — Assessment & Plan Note (Addendum)
CKD 3 A in the setting of hypertension and hyperlipidemia.  She currently has excellent control of her hypertension and is on [place] for protection.  Labs are pending today.

## 2022-11-27 ENCOUNTER — Ambulatory Visit: Payer: Medicare Other

## 2022-11-28 ENCOUNTER — Ambulatory Visit
Admission: RE | Admit: 2022-11-28 | Discharge: 2022-11-28 | Disposition: A | Discharge status: Home / Self Care | Payer: Medicare Other | Source: Ambulatory Visit | Attending: Nurse Practitioner | Admitting: Nurse Practitioner

## 2022-11-28 DIAGNOSIS — Z1231 Encounter for screening mammogram for malignant neoplasm of breast: Secondary | ICD-10-CM

## 2023-02-26 DIAGNOSIS — Z23 Encounter for immunization: Secondary | ICD-10-CM | POA: Diagnosis not present

## 2023-04-02 ENCOUNTER — Ambulatory Visit: Payer: Medicare Other | Admitting: Family Medicine

## 2023-04-02 ENCOUNTER — Encounter: Payer: Self-pay | Admitting: Family Medicine

## 2023-04-02 VITALS — BP 170/90 | HR 77 | Ht 62.0 in | Wt 196.8 lb

## 2023-04-02 DIAGNOSIS — M461 Sacroiliitis, not elsewhere classified: Secondary | ICD-10-CM | POA: Diagnosis not present

## 2023-04-02 MED ORDER — TRAMADOL HCL 50 MG PO TABS: 50.0000 mg | ORAL_TABLET | Freq: Three times a day (TID) | ORAL | 0 refills | Status: DC | PRN

## 2023-04-02 MED ORDER — MELOXICAM 15 MG PO TABS: 15.0000 mg | ORAL_TABLET | Freq: Every day | ORAL | 1 refills | Status: DC

## 2023-04-02 NOTE — Patient Instructions (Signed)
Heat for 20 minutes 3 times per day with gentle stretching after that.  The Mobic daily and you can also add 2 Tylenol 4 times per day for extra pain relief

## 2023-04-02 NOTE — Progress Notes (Signed)
Subjective:    Patient ID: Casey Taylor, female    DOB: 12-Jul-1935, 87 y.o.   MRN: [id number]  HPI She complains of an approximate 1 week history of right-sided back pain.  She initially described it as an hip area.  No history of injury or overuse.  No numbness tingling or weakness.  She also has an impending trip that she wants to make sure she is healthy for her.     Review of Systems     Objective:    Physical Exam Normal lumbar curve.  Normal hip motion.  Tender to palpation over the right SI joint with Pearlean Brownie test being positive.       Assessment & Plan:   Problem List Items Addressed This Visit   None Visit Diagnoses     Sacroiliitis (HCC)    -  Primary   Relevant Medications   meloxicam (MOBIC) 15 MG tablet   traMADol (ULTRAM) 50 MG tablet      Heat for 20 minutes 3 times per day with gentle stretching after that.  The Mobic daily and you can also add 2 Tylenol 4 times per day for extra pain relief. I will also give tramadol in case she needs more pain medications while she is on her trip.

## 2023-04-19 ENCOUNTER — Ambulatory Visit (INDEPENDENT_AMBULATORY_CARE_PROVIDER_SITE_OTHER): Payer: Medicare Other | Admitting: Nurse Practitioner

## 2023-04-19 DIAGNOSIS — J069 Acute upper respiratory infection, unspecified: Secondary | ICD-10-CM

## 2023-04-19 DIAGNOSIS — B9689 Other specified bacterial agents as the cause of diseases classified elsewhere: Secondary | ICD-10-CM | POA: Diagnosis not present

## 2023-04-19 MED ORDER — PROMETHAZINE-DM 6.25-15 MG/5ML PO SYRP: 5.0000 mL | ORAL_SOLUTION | Freq: Three times a day (TID) | ORAL | 0 refills | Status: DC | PRN

## 2023-04-19 MED ORDER — AZITHROMYCIN 250 MG PO TABS: ORAL_TABLET | ORAL | 0 refills | Status: AC

## 2023-04-19 MED ORDER — PREDNISONE 20 MG PO TABS: 20.0000 mg | ORAL_TABLET | Freq: Every day | ORAL | 0 refills | Status: DC

## 2023-04-19 MED ORDER — ALBUTEROL SULFATE HFA 108 (90 BASE) MCG/ACT IN AERS: 1.0000 | INHALATION_SPRAY | RESPIRATORY_TRACT | 0 refills | Status: AC | PRN

## 2023-04-19 NOTE — Progress Notes (Signed)
Tollie Eth, DNP, AGNP-c [facility] [address] [place], [place] [address] [phone]   ACUTE VISIT- ESTABLISHED PATIENT  Blood pressure 128/84, pulse 88, weight 197 lb 12.8 oz (89.7 kg), SpO2 96%.  Subjective:  HPI Morning Casey Taylor is a 87 y.o. female presents to day for evaluation of acute concern(s).   History of Present Illness Casey Taylor, with a history of asthma, presents with a non-productive cough and symptoms of a head cold that began on Sunday. The cough is described as persistent and annoying, causing breathlessness and chest discomfort. The patient has been taking Theraflu, drinking hot tea, and using an albuterol inhaler, which is due to expire this month, with minimal relief.  The patient also reports feeling rundown and weak, particularly noticeable in the eyes. There is no fever, chills, or facial pain reported. The patient has been taking over-the-counter allergy medication, suspecting sinus involvement. The patient recently returned from a cruise, where she experienced a significant change in climate, which she suspects may have contributed to her current symptoms.  The patient's cough is causing some soreness in the chest and throat, but there is no complaint of a sore throat per se. The patient denies any other symptoms or new medications. The patient's asthma has been stable, and she has not noticed any fevers.   ROS negative except for what is listed in HPI. History, Medications, Surgery, SDOH, and Family History reviewed and updated as appropriate.  Objective:  Physical Exam Vitals and nursing note reviewed.  Constitutional:      General: She is not in acute distress.    Appearance: Normal appearance. She is ill-appearing.  HENT:     Head: Normocephalic.     Nose: Nose normal.     Mouth/Throat:     Mouth: Mucous membranes are moist.  Eyes:     Conjunctiva/sclera: Conjunctivae normal.  Neck:     Vascular: No carotid bruit.   Cardiovascular:     Rate and Rhythm: Normal rate and regular rhythm.     Pulses: Normal pulses.     Heart sounds: Normal heart sounds.  Pulmonary:     Breath sounds: Wheezing and rhonchi present.  Abdominal:     General: Bowel sounds are normal.     Palpations: Abdomen is soft.  Lymphadenopathy:     Cervical: Cervical adenopathy present.  Skin:    General: Skin is warm and dry.     Capillary Refill: Capillary refill takes less than 2 seconds.  Neurological:     Mental Status: She is alert and oriented to person, place, and time.  Psychiatric:        Mood and Affect: Mood normal.          Assessment & Plan:   Problem List Items Addressed This Visit     Bacterial upper respiratory infection    Persistent nonproductive cough since Sunday, causing chest discomfort and sleep disturbance. No fever or chills. Lung exam reveals some areas of decreased air transmission, suggesting possible inflammation. No wheezes or crackles heard. -Start Azithromycin today with 2 tablets, then 1 tablet daily for the next 4 days. -Start Prednisone once daily in the morning. -Start Promethazine-Dextromethorphan up to three times a day as needed for cough. -Refill Albuterol inhaler. -Check temperature at home and monitor for any fever. -Return to clinic or call if not feeling better by Monday.      Relevant Medications   albuterol (VENTOLIN HFA) 108 (90 Base) MCG/ACT inhaler   promethazine-dextromethorphan (PROMETHAZINE-DM) 6.25-15 MG/5ML  syrup   predniSONE (DELTASONE) 20 MG tablet      Tollie Eth, DNP, AGNP-c

## 2023-04-19 NOTE — Patient Instructions (Signed)

## 2023-04-24 ENCOUNTER — Ambulatory Visit: Payer: Medicare Other | Admitting: Nurse Practitioner

## 2023-04-24 NOTE — Progress Notes (Signed)
Patient not seen.

## 2023-04-29 ENCOUNTER — Encounter: Payer: Self-pay | Admitting: Nurse Practitioner

## 2023-04-29 NOTE — Assessment & Plan Note (Signed)
Persistent nonproductive cough since Sunday, causing chest discomfort and sleep disturbance. No fever or chills. Lung exam reveals some areas of decreased air transmission, suggesting possible inflammation. No wheezes or crackles heard. -Start Azithromycin today with 2 tablets, then 1 tablet daily for the next 4 days. -Start Prednisone once daily in the morning. -Start Promethazine-Dextromethorphan up to three times a day as needed for cough. -Refill Albuterol inhaler. -Check temperature at home and monitor for any fever. -Return to clinic or call if not feeling better by Monday.

## 2023-06-30 ENCOUNTER — Other Ambulatory Visit: Payer: Self-pay | Admitting: Nurse Practitioner

## 2023-06-30 DIAGNOSIS — F419 Anxiety disorder, unspecified: Secondary | ICD-10-CM

## 2023-07-01 NOTE — Telephone Encounter (Signed)
 Last apt 10/22/22.

## 2023-07-03 DIAGNOSIS — H10413 Chronic giant papillary conjunctivitis, bilateral: Secondary | ICD-10-CM | POA: Diagnosis not present

## 2023-07-03 DIAGNOSIS — H43813 Vitreous degeneration, bilateral: Secondary | ICD-10-CM | POA: Diagnosis not present

## 2023-07-03 DIAGNOSIS — H26491 Other secondary cataract, right eye: Secondary | ICD-10-CM | POA: Diagnosis not present

## 2023-07-03 DIAGNOSIS — Z961 Presence of intraocular lens: Secondary | ICD-10-CM | POA: Diagnosis not present

## 2023-07-03 DIAGNOSIS — H04123 Dry eye syndrome of bilateral lacrimal glands: Secondary | ICD-10-CM | POA: Diagnosis not present

## 2023-07-08 ENCOUNTER — Encounter: Payer: Self-pay | Admitting: Nurse Practitioner

## 2023-07-08 ENCOUNTER — Ambulatory Visit (INDEPENDENT_AMBULATORY_CARE_PROVIDER_SITE_OTHER): Payer: Medicare Other | Admitting: Nurse Practitioner

## 2023-07-08 VITALS — BP 122/80 | HR 79 | Ht 63.0 in | Wt 196.2 lb

## 2023-07-08 DIAGNOSIS — R739 Hyperglycemia, unspecified: Secondary | ICD-10-CM

## 2023-07-08 DIAGNOSIS — E782 Mixed hyperlipidemia: Secondary | ICD-10-CM | POA: Diagnosis not present

## 2023-07-08 DIAGNOSIS — R0601 Orthopnea: Secondary | ICD-10-CM

## 2023-07-08 DIAGNOSIS — E559 Vitamin D deficiency, unspecified: Secondary | ICD-10-CM

## 2023-07-08 DIAGNOSIS — Z0000 Encounter for general adult medical examination without abnormal findings: Secondary | ICD-10-CM | POA: Diagnosis not present

## 2023-07-08 DIAGNOSIS — R600 Localized edema: Secondary | ICD-10-CM | POA: Diagnosis not present

## 2023-07-08 DIAGNOSIS — D860 Sarcoidosis of lung: Secondary | ICD-10-CM | POA: Diagnosis not present

## 2023-07-08 DIAGNOSIS — Z8673 Personal history of transient ischemic attack (TIA), and cerebral infarction without residual deficits: Secondary | ICD-10-CM

## 2023-07-08 DIAGNOSIS — M1712 Unilateral primary osteoarthritis, left knee: Secondary | ICD-10-CM

## 2023-07-08 DIAGNOSIS — N1831 Chronic kidney disease, stage 3a: Secondary | ICD-10-CM

## 2023-07-08 DIAGNOSIS — I10 Essential (primary) hypertension: Secondary | ICD-10-CM

## 2023-07-08 NOTE — Assessment & Plan Note (Signed)
Well controlled on current regimen of losartan. No changes needed. Labs for monitoring today.

## 2023-07-08 NOTE — Assessment & Plan Note (Signed)

## 2023-07-08 NOTE — Assessment & Plan Note (Signed)
Repeat labs for monitoring. Improvement noted with [place]. Plan to continue.

## 2023-07-08 NOTE — Assessment & Plan Note (Signed)
Intermittent dyspnea during physical activity, such as walking or shopping. No dyspnea at rest. No cough, palpitations, or chest pain. Sarcoidosis may be contributing. Differential includes asthma, COPD, or sarcoidosis progression. Minimal crackles at lung bases on auscultation. Previous BNP negative. Discussed benefits of pulmonary function testing (PFT) to determine if dyspnea is due to asthma, COPD, or sarcoidosis progression. Explained PFTs measure lung function and can help identify the cause of symptoms. Discussed Trelegy, a daily inhaler trial while waiting for PFT's to see if this was helpful in reducing daily symptoms, but after discussion with daughter, they decided to wait for PFT results before starting new medication. - Order pulmonary function testing (PFT) - Continue using albuterol inhaler as needed - Consider chest CT if PFTs indicate further investigation is needed

## 2023-07-08 NOTE — Patient Instructions (Signed)
I have sent in the referral for the pulmonary testing. They will call you to schedule this with pulmonology.  Keep using your regular inhaler for shortness of breath as needed.   You look fabulous!!! Let me know if you need anything.

## 2023-07-08 NOTE — Assessment & Plan Note (Signed)
Chronic knee pain with swelling, particularly in one knee. Managed with Tylenol, which provides some relief. - Continue current pain management with Tylenol as needed

## 2023-07-08 NOTE — Assessment & Plan Note (Signed)
Chronic left LE edema with no new symptoms present. Likely related to previous surgery. Continue to monitor.

## 2023-07-08 NOTE — Progress Notes (Signed)
Casey Clamp, DNP, AGNP-c [facility] [address] [place], [place] [address] Main Office [phone]  BP 122/80   Pulse 79   Ht 5\' 3"  (1.6 m)   Wt 196 lb 3.2 oz (89 kg)   BMI 34.76 kg/m    Subjective:    Patient ID: Casey Taylor Karstens, female    DOB: [birth year], 88 y.o.   MRN: [id number]  HPI: Casey Taylor is a 88 y.o. female presenting on 07/08/2023 for comprehensive medical examination.   History of Present Illness Angelik presents with progressive exertional dyspnea, particularly noticeable during activities such as walking or shopping. She denies experiencing dyspnea at rest or any associated cough. She denies lower extremity edema with exception of chronic swelling in one foot, which does not significantly improve overnight. This swelling is attributed to a previous knee surgery. The patient denies any recent changes in bowel or bladder habits, vision, hearing, or any episodes of weakness or dizziness. she does report a history of sarcoidosis, which was not in the chart.   Her dyspnea is not associated with palpitations or chest pain, and there is no history of smoking or exposure to harmful fumes. The patient's dyspnea does not appear to be influenced by weather changes. The patient's history of sarcoidosis, diagnosed in Kuz[place] Ogata adulthood, is noted, although the patient has not had any significant issues related to this condition for many years.  She maintains a healthy diet, rich in fruits and vegetables, and uses an albuterol inhaler for the dyspnea as needed.  Pertinent items are noted in HPI.  Review and update Health Maintenance  Most Recent Depression Screen:     07/08/2023    9:44 AM 10/22/2022   10:33 AM 09/25/2022    9:54 AM 04/30/2022   11:12 AM 02/22/2022   10:33 AM  Depression screen PHQ 2/9  Decreased Interest 0 0 0 0 0  Down, Depressed, Hopeless 0 0 0 0 0  PHQ - 2 Score 0 0 0 0 0  Altered sleeping   0 0 0  Tired, decreased  energy   0 1 0  Change in appetite   0 0 0  Feeling bad or failure about yourself    0 0 0  Trouble concentrating   0 0 0  Moving slowly or fidgety/restless   0 0 0  Suicidal thoughts   0 0 0  PHQ-9 Score   0 1 0  Difficult doing work/chores   Not difficult at all Not difficult at all    Most Recent Anxiety Screen:     04/30/2022   11:12 AM  GAD 7 : Generalized Anxiety Score  Nervous, Anxious, on Edge 0  Control/stop worrying 0  Worry too much - different things 0  Trouble relaxing 0  Restless 0  Easily annoyed or irritable 0  Afraid - awful might happen 0  Total GAD 7 Score 0  Anxiety Difficulty Not difficult at all   Most Recent Fall Screen:    07/08/2023    9:44 AM 10/22/2022   10:33 AM 09/25/2022    9:54 AM 04/30/2022   11:11 AM 02/22/2022   10:32 AM  Fall Risk   Falls in the past year? 0 0 0 0 0  Number falls in past yr: 0 0 0  0  Injury with Fall? 0 0 0  0  Risk for fall due to : No Fall Risks No Fall Risks Medication side effect;Impaired mobility  No Fall Risks  Follow up Falls  evaluation completed Falls evaluation completed Falls prevention discussed;Education provided;Falls evaluation completed  Falls evaluation completed;Education provided    Past medical history, surgical history, medications, allergies, family history and social history reviewed with patient today and changes made to appropriate areas of the chart.  Past Medical History:  Past Medical History:  Diagnosis Date   Acute bilateral low back pain with right-sided sciatica 08/23/2021   Anxiety    Arthritis    Atypical chest pain 11/12/2017   Bacterial upper respiratory infection 06/15/2022   Breast cancer (HCC)    Essential hypertension 11/12/2017   Gout    Hypertension    dr shelton   Primary localized osteoarthritis of left knee 01/26/2019   Stroke [facility])    tia    06   Medications:  Current Outpatient Medications on File Prior to Visit  Medication Sig   allopurinol (ZYLOPRIM) 100 MG tablet  Take 1 tablet (100 mg total) by mouth daily.   ALPRAZolam (XANAX) 0.25 MG tablet Take 1 tablet by mouth twice daily as needed   aspirin 81 MG EC tablet aspirin 81 mg tablet,delayed release  TAKE 1 TABLET BY MOUTH ONCE DAILY SWALLOW WHOLE   Cholecalciferol (VITAMIN D PO) Take 2,000 Units by mouth daily.    dapagliflozin propanediol (FARXIGA) 5 MG TABS tablet Take 1 tablet (5 mg total) by mouth daily.   hydrocortisone 2.5 % cream    losartan (COZAAR) 25 MG tablet Take 1 tablet (25 mg total) by mouth daily.   Magnesium 250 MG TABS Take 250 mg by mouth.    pantoprazole (PROTONIX) 40 MG tablet Take 1 tablet (40 mg total) by mouth daily.   pravastatin (PRAVACHOL) 40 MG tablet Take 1 tablet (40 mg total) by mouth every evening.   triamcinolone (KENALOG) 0.025 % ointment Apply topically.   albuterol (VENTOLIN HFA) 108 (90 Base) MCG/ACT inhaler Inhale 1-2 puffs into the lungs every 4 (four) hours as needed for wheezing or shortness of breath. (Patient not taking: Reported on 07/08/2023)   colchicine 0.6 MG tablet Take 1 tablet (0.6 mg total) by mouth daily as needed (gout). (Patient not taking: Reported on 07/08/2023)   Current Facility-Administered Medications on File Prior to Visit  Medication   betamethasone acetate-betamethasone sodium phosphate (CELESTONE) injection 3 mg   Surgical History:  Past Surgical History:  Procedure Laterality Date   ABDOMINAL HYSTERECTOMY     BREAST BIOPSY Left 05/26/2013   Procedure: BREAST BIOPSY WITH NEEDLE LOCALIZATION;  Surgeon: Almond Lint, MD;  Location: [facility] OR;  Service: General;  Laterality: Left;   BREAST LUMPECTOMY Right 2014   CHOLECYSTECTOMY     PARTIAL MASTECTOMY WITH NEEDLE LOCALIZATION Right 05/26/2013   Procedure: PARTIAL MASTECTOMY WITH NEEDLE LOCALIZATION;  Surgeon: Almond Lint, MD;  Location: [facility] OR;  Service: General;  Laterality: Right;   TOTAL KNEE ARTHROPLASTY Left 01/26/2019   Procedure: TOTAL KNEE ARTHROPLASTY;  Surgeon: Salvatore Marvel, MD;   Location: [facility] ORS;  Service: Orthopedics;  Laterality: Left;   Allergies:  Allergies  Allergen Reactions   Codeine Nausea Only   Family History:  Family History  Problem Relation Age of Onset   Heart attack Mother    Diabetes Mother    Dementia Mother    Heart attack Father    Hypertension Sister    Diabetes Sister    Diabetes Brother    Kidney disease Brother    Esophageal cancer Brother    Lung cancer Brother    Prostate cancer Brother    Diabetes Brother  Objective:    BP 122/80   Pulse 79   Ht 5\' 3"  (1.6 m)   Wt 196 lb 3.2 oz (89 kg)   BMI 34.76 kg/m   Wt Readings from Last 3 Encounters:  07/08/23 196 lb 3.2 oz (89 kg)  04/19/23 197 lb 12.8 oz (89.7 kg)  04/02/23 196 lb 12.8 oz (89.3 kg)    Physical Exam Vitals and nursing note reviewed.  Constitutional:      General: She is not in acute distress.    Appearance: Normal appearance.  HENT:     Head: Normocephalic and atraumatic.     Right Ear: Hearing, tympanic membrane, ear canal and external ear normal.     Left Ear: Hearing, tympanic membrane, ear canal and external ear normal.     Nose: Nose normal.     Right Sinus: No maxillary sinus tenderness or frontal sinus tenderness.     Left Sinus: No maxillary sinus tenderness or frontal sinus tenderness.     Mouth/Throat:     Lips: Pink.     Mouth: Mucous membranes are moist.     Pharynx: Oropharynx is clear.  Eyes:     General: Lids are normal. Vision grossly intact.     Extraocular Movements: Extraocular movements intact.     Conjunctiva/sclera: Conjunctivae normal.     Pupils: Pupils are equal, round, and reactive to light.     Funduscopic exam:    Right eye: Red reflex present.        Left eye: Red reflex present.    Visual Fields: Right eye visual fields normal and left eye visual fields normal.  Neck:     Thyroid: No thyromegaly.     Vascular: No carotid bruit.  Cardiovascular:     Rate and Rhythm: Normal rate and regular rhythm.     Chest  Wall: PMI is not displaced.     Pulses: Normal pulses.          Dorsalis pedis pulses are 2+ on the right side and 2+ on the left side.       Posterior tibial pulses are 2+ on the right side and 2+ on the left side.     Heart sounds: Normal heart sounds. No murmur heard. Pulmonary:     Effort: Pulmonary effort is normal. No respiratory distress.     Breath sounds: Rhonchi present.     Comments: Very minimal rhonchi noted in the bases of the lungs bilaterally. No symptoms of infection or wheezing at this time.  Abdominal:     General: Abdomen is flat. Bowel sounds are normal. There is no distension.     Palpations: Abdomen is soft. There is no hepatomegaly, splenomegaly or mass.     Tenderness: There is no abdominal tenderness. There is no right CVA tenderness, left CVA tenderness, guarding or rebound.  Musculoskeletal:        General: Normal range of motion.     Cervical back: Full passive range of motion without pain, normal range of motion and neck supple. No tenderness.     Right lower leg: No edema.     Left lower leg: Edema present.  Feet:     Left foot:     Toenail Condition: Left toenails are normal.  Lymphadenopathy:     Cervical: No cervical adenopathy.     Upper Body:     Right upper body: No supraclavicular adenopathy.     Left upper body: No supraclavicular adenopathy.  Skin:  General: Skin is warm and dry.     Capillary Refill: Capillary refill takes less than 2 seconds.     Nails: There is no clubbing.  Neurological:     General: No focal deficit present.     Mental Status: She is alert and oriented to person, place, and time.     GCS: GCS eye subscore is 4. GCS verbal subscore is 5. GCS motor subscore is 6.     Sensory: Sensation is intact.     Motor: Motor function is intact.     Coordination: Coordination is intact.     Gait: Gait is intact.     Deep Tendon Reflexes: Reflexes are normal and symmetric.  Psychiatric:        Attention and Perception: Attention  normal.        Mood and Affect: Mood normal.        Speech: Speech normal.        Behavior: Behavior normal. Behavior is cooperative.        Thought Content: Thought content normal.        Cognition and Memory: Cognition and memory normal.        Judgment: Judgment normal.      Results for orders placed or performed in visit on 10/22/22  CBC with Differential/Platelet   Collection Time: 10/22/22 11:28 AM  Result Value Ref Range   WBC 5.8 3.4 - 10.8 x10E3/uL   RBC 4.46 3.77 - 5.28 x10E6/uL   Hemoglobin 13.6 11.1 - 15.9 g/dL   Hematocrit 40.9 81.1 - 46.6 %   MCV 91 79 - 97 fL   MCH 30.5 26.6 - 33.0 pg   MCHC 33.4 31.5 - 35.7 g/dL   RDW 91.4 78.2 - 95.6 %   Platelets 295 150 - 450 x10E3/uL   Neutrophils 55 Not Estab. %   Lymphs 33 Not Estab. %   Monocytes 8 Not Estab. %   Eos 3 Not Estab. %   Basos 1 Not Estab. %   Neutrophils Absolute 3.2 1.4 - 7.0 x10E3/uL   Lymphocytes Absolute 1.9 0.7 - 3.1 x10E3/uL   Monocytes Absolute 0.5 0.1 - 0.9 x10E3/uL   EOS (ABSOLUTE) 0.2 0.0 - 0.4 x10E3/uL   Basophils Absolute 0.1 0.0 - 0.2 x10E3/uL   Immature Granulocytes 0 Not Estab. %   Immature Grans (Abs) 0.0 0.0 - 0.1 x10E3/uL  Comprehensive metabolic panel   Collection Time: 10/22/22 11:28 AM  Result Value Ref Range   Glucose 95 70 - 99 mg/dL   BUN 22 8 - 27 mg/dL   Creatinine, Ser 2.13 0.57 - 1.00 mg/dL   eGFR 58 (L) >08 MV/HQI/6.96   BUN/Creatinine Ratio 23 12 - 28   Sodium 142 134 - 144 mmol/L   Potassium 4.7 3.5 - 5.2 mmol/L   Chloride 105 96 - 106 mmol/L   CO2 23 20 - 29 mmol/L   Calcium 9.4 8.7 - 10.3 mg/dL   Total Protein 6.8 6.0 - 8.5 g/dL   Albumin 4.3 3.7 - 4.7 g/dL   Globulin, Total 2.5 1.5 - 4.5 g/dL   Albumin/Globulin Ratio 1.7 1.2 - 2.2   Bilirubin Total 0.5 0.0 - 1.2 mg/dL   Alkaline Phosphatase 88 44 - 121 IU/L   AST 16 0 - 40 IU/L   ALT 13 0 - 32 IU/L  VITAMIN D 25 Hydroxy (Vit-D Deficiency, Fractures)   Collection Time: 10/22/22 11:28 AM  Result Value Ref  Range   Vit D, 25-Hydroxy 34.4 30.0 -  100.0 ng/mL  Brain natriuretic peptide   Collection Time: 10/22/22 11:28 AM  Result Value Ref Range   BNP 42.1 0.0 - 100.0 pg/mL       Assessment & Plan:   Problem List Items Addressed This Visit     Osteoarthritis (arthritis due to wear and tear of joints)   Chronic knee pain with swelling, particularly in one knee. Managed with Tylenol, which provides some relief. - Continue current pain management with Tylenol as needed      Essential hypertension   Well controlled on current regimen of losartan. No changes needed. Labs for monitoring today.       Lower leg edema   Chronic left LE edema with no new symptoms present. Likely related to previous surgery. Continue to monitor.       Relevant Orders   CBC with Differential/Platelet   Comprehensive metabolic panel   Mixed hyperlipidemia   Currently managed with pravastatin with no concerning symptoms. Continue current management.       Chronic renal failure, stage 3a (HCC)   Repeat labs for monitoring. Improvement noted with [place]. Plan to continue.       Relevant Orders   Comprehensive metabolic panel   Orthopnea   Intermittent dyspnea during physical activity, such as walking or shopping. No dyspnea at rest. No cough, palpitations, or chest pain. Sarcoidosis may be contributing. Differential includes asthma, COPD, or sarcoidosis progression. Minimal crackles at lung bases on auscultation. Previous BNP negative. Discussed benefits of pulmonary function testing (PFT) to determine if dyspnea is due to asthma, COPD, or sarcoidosis progression. Explained PFTs measure lung function and can help identify the cause of symptoms. Discussed Trelegy, a daily inhaler trial while waiting for PFT's to see if this was helpful in reducing daily symptoms, but after discussion with daughter, they decided to wait for PFT results before starting new medication. - Order pulmonary function testing (PFT) -  Continue using albuterol inhaler as needed - Consider chest CT if PFTs indicate further investigation is needed      Encounter for annual physical exam   CPE completed today. Review of HM activities and recommendations discussed and provided on AVS. Anticipatory guidance, diet, and exercise recommendations provided. Medications, allergies, and hx reviewed and updated as necessary. Orders placed as listed below.  Plan: - Labs ordered. Will make changes as necessary based on results.  - I will review these results and send recommendations via MyChart or a telephone call.  - F/U with CPE in 1 year or sooner for acute/chronic health needs as directed.        Relevant Orders   CBC with Differential/Platelet   Comprehensive metabolic panel   Hemoglobin A1c   Vitamin D deficiency, unspecified   History of stroke   Other Visit Diagnoses       Sarcoidosis of lung (HCC)    -  Primary   Relevant Orders   Ambulatory referral to Pulmonology     Elevated blood sugar       Relevant Orders   Hemoglobin A1c         Follow up plan: Return in about 6 months (around 01/05/2024) for Medicare wellness.  NEXT PREVENTATIVE PHYSICAL DUE IN 1 YEAR.  PATIENT COUNSELING PROVIDED FOR ALL ADULT PATIENTS: A well balanced diet low in saturated fats, cholesterol, and moderation in carbohydrates.  This can be as simple as monitoring portion sizes and cutting back on sugary beverages such as soda and juice to start with.    Daily  water consumption of at least 64 ounces.  Physical activity at least 180 minutes per week.  If just starting out, start 10 minutes a day and work your way up.   This can be as simple as taking the stairs instead of the elevator and walking 2-3 laps around the office  purposefully every day.   STD protection, partner selection, and regular testing if high risk.  Limited consumption of alcoholic beverages if alcohol is consumed. For men, I recommend no more than 14 alcoholic  beverages per week, spread out throughout the week (max 2 per day). Avoid "binge" drinking or consuming large quantities of alcohol in one setting.  Please let me know if you feel you may need help with reduction or quitting alcohol consumption.   Avoidance of nicotine, if used. Please let me know if you feel you may need help with reduction or quitting nicotine use.   Daily mental health attention. This can be in the form of 5 minute daily meditation, prayer, journaling, yoga, reflection, etc.  Purposeful attention to your emotions and mental state can significantly improve your overall wellbeing  and  Health.  Please know that I am here to help you with all of your health care goals and am happy to work with you to find a solution that works best for you.  The greatest advice I have received with any changes in life are to take it one step at a time, that even means if all you can focus on is the next 60 seconds, then do that and celebrate your victories.  With any changes in life, you will have set backs, and that is OK. The important thing to remember is, if you have a set back, it is not a failure, it is an opportunity to try again! Screening Testing Mammogram Every 1 -2 years based on history and risk factors Starting at age 50 Pap Smear Ages 21-39 every 3 years Ages 67-65 every 5 years with HPV testing More frequent testing may be required based on results and history Colon Cancer Screening Every 1-10 years based on test performed, risk factors, and history Starting at age 37 Bone Density Screening Every 2-10 years based on history Starting at age 68 for women Recommendations for men differ based on medication usage, history, and risk factors AAA Screening One time ultrasound Men 30-20 years old who have every smoked Lung Cancer Screening Low Dose Lung CT every 12 months Age 75-80 years with a 30 pack-year smoking history who still smoke or who have quit within the last 15  years   Screening Labs Routine  Labs: Complete Blood Count (CBC), Complete Metabolic Panel (CMP), Cholesterol (Lipid Panel) Every 6-12 months based on history and medications May be recommended more frequently based on current conditions or previous results Hemoglobin A1c Lab Every 3-12 months based on history and previous results Starting at age 61 or earlier with diagnosis of diabetes, high cholesterol, BMI >26, and/or risk factors Frequent monitoring for patients with diabetes to ensure blood sugar control Thyroid Panel (TSH) Every 6 months based on history, symptoms, and risk factors May be repeated more often if on medication HIV One time testing for all patients 77 and older May be repeated more frequently for patients with increased risk factors or exposure Hepatitis C One time testing for all patients 62 and older May be repeated more frequently for patients with increased risk factors or exposure Gonorrhea, Chlamydia Every 12 months for all sexually active persons 13-24  years Additional monitoring may be recommended for those who are considered high risk or who have symptoms Every 12 months for any woman on birth control, regardless of sexual activity PSA Men 41-35 years old with risk factors Additional screening may be recommended from age 53-69 based on risk factors, symptoms, and history  Vaccine Recommendations Tetanus Booster All adults every 10 years Flu Vaccine All patients 6 months and older every year COVID Vaccine All patients 12 years and older Initial dosing with booster May recommend additional booster based on age and health history HPV Vaccine 2 doses all patients age 21-26 Dosing may be considered for patients over 26 Shingles Vaccine (Shingrix) 2 doses all adults 55 years and older Pneumonia (Pneumovax 67) All adults 65 years and older May recommend earlier dosing based on health history One year apart from Prevnar 81 Pneumonia (Prevnar 49) All  adults 65 years and older Dosed 1 year after Pneumovax 23 Pneumonia (Prevnar 20) One time alternative to the two dosing of 13 and 23 For all adults with initial dose of 23, 20 is recommended 1 year later For all adults with initial dose of 13, 23 is still recommended as second option 1 year later

## 2023-07-08 NOTE — Assessment & Plan Note (Signed)
Currently managed with pravastatin with no concerning symptoms. Continue current management.

## 2023-07-09 LAB — COMPREHENSIVE METABOLIC PANEL
ALT: 18 IU/L (ref 0–32)
AST: 21 IU/L (ref 0–40)
Albumin: 4.1 g/dL (ref 3.7–4.7)
Alkaline Phosphatase: 93 IU/L (ref 44–121)
BUN/Creatinine Ratio: 23 (ref 12–28)
BUN: 23 mg/dL (ref 8–27)
Bilirubin Total: 0.4 mg/dL (ref 0.0–1.2)
CO2: 24 mmol/L (ref 20–29)
Calcium: 9.5 mg/dL (ref 8.7–10.3)
Chloride: 106 mmol/L (ref 96–106)
Creatinine, Ser: 1.01 mg/dL — ABNORMAL HIGH (ref 0.57–1.00)
Globulin, Total: 2.7 g/dL (ref 1.5–4.5)
Glucose: 84 mg/dL (ref 70–99)
Potassium: 4.3 mmol/L (ref 3.5–5.2)
Sodium: 142 mmol/L (ref 134–144)
Total Protein: 6.8 g/dL (ref 6.0–8.5)
eGFR: 55 mL/min/{1.73_m2} — ABNORMAL LOW (ref >59)

## 2023-07-09 LAB — CBC WITH DIFFERENTIAL/PLATELET
Basophils Absolute: 0.1 10*3/uL (ref 0.0–0.2)
Basos: 1 %
EOS (ABSOLUTE): 0.3 10*3/uL (ref 0.0–0.4)
Eos: 5 %
Hematocrit: 40.8 % (ref 34.0–46.6)
Hemoglobin: 13.1 g/dL (ref 11.1–15.9)
Immature Grans (Abs): 0 10*3/uL (ref 0.0–0.1)
Immature Granulocytes: 0 %
Lymphocytes Absolute: 1.7 10*3/uL (ref 0.7–3.1)
Lymphs: 32 %
MCH: 30.3 pg (ref 26.6–33.0)
MCHC: 32.1 g/dL (ref 31.5–35.7)
MCV: 94 fL (ref 79–97)
Monocytes Absolute: 0.4 10*3/uL (ref 0.1–0.9)
Monocytes: 8 %
Neutrophils Absolute: 2.9 10*3/uL (ref 1.4–7.0)
Neutrophils: 54 %
Platelets: 301 10*3/uL (ref 150–450)
RBC: 4.32 x10E6/uL (ref 3.77–5.28)
RDW: 12 % (ref 11.7–15.4)
WBC: 5.3 10*3/uL (ref 3.4–10.8)

## 2023-07-09 LAB — HEMOGLOBIN A1C
Est. average glucose Bld gHb Est-mCnc: 108 mg/dL
Hgb A1c MFr Bld: 5.4 % (ref 4.8–5.6)

## 2023-07-18 ENCOUNTER — Other Ambulatory Visit: Payer: Self-pay | Admitting: Family Medicine

## 2023-07-18 DIAGNOSIS — M461 Sacroiliitis, not elsewhere classified: Secondary | ICD-10-CM

## 2023-07-24 ENCOUNTER — Telehealth: Payer: Self-pay

## 2023-07-24 ENCOUNTER — Other Ambulatory Visit: Payer: Self-pay

## 2023-07-24 DIAGNOSIS — M1712 Unilateral primary osteoarthritis, left knee: Secondary | ICD-10-CM

## 2023-07-24 DIAGNOSIS — M1A00X0 Idiopathic chronic gout, unspecified site, without tophus (tophi): Secondary | ICD-10-CM

## 2023-07-24 DIAGNOSIS — E782 Mixed hyperlipidemia: Secondary | ICD-10-CM

## 2023-07-24 DIAGNOSIS — I10 Essential (primary) hypertension: Secondary | ICD-10-CM

## 2023-07-24 MED ORDER — LOSARTAN POTASSIUM 25 MG PO TABS: 25.0000 mg | ORAL_TABLET | Freq: Every day | ORAL | 1 refills | Status: DC

## 2023-07-24 MED ORDER — ALLOPURINOL 100 MG PO TABS: 100.0000 mg | ORAL_TABLET | Freq: Every day | ORAL | 1 refills | Status: DC

## 2023-07-24 NOTE — Telephone Encounter (Signed)
Pt states she is completely out of losartan, rosuvastatin, and meloxicam. Would like rx sent to [facility].

## 2023-07-26 MED ORDER — MELOXICAM 7.5 MG PO TABS: 7.5000 mg | ORAL_TABLET | Freq: Every day | ORAL | 11 refills | Status: AC | PRN

## 2023-07-26 MED ORDER — LOSARTAN POTASSIUM 25 MG PO TABS: 25.0000 mg | ORAL_TABLET | Freq: Every day | ORAL | 3 refills | Status: DC

## 2023-07-26 MED ORDER — PRAVASTATIN SODIUM 40 MG PO TABS: 40.0000 mg | ORAL_TABLET | Freq: Every evening | ORAL | 3 refills | Status: AC

## 2023-08-28 ENCOUNTER — Encounter (HOSPITAL_BASED_OUTPATIENT_CLINIC_OR_DEPARTMENT_OTHER): Payer: Self-pay | Admitting: Pulmonary Disease

## 2023-08-28 ENCOUNTER — Ambulatory Visit (HOSPITAL_BASED_OUTPATIENT_CLINIC_OR_DEPARTMENT_OTHER)

## 2023-08-28 ENCOUNTER — Ambulatory Visit (HOSPITAL_BASED_OUTPATIENT_CLINIC_OR_DEPARTMENT_OTHER): Payer: Medicare Other | Admitting: Pulmonary Disease

## 2023-08-28 VITALS — BP 120/78 | HR 68 | Ht 63.0 in | Wt 193.0 lb

## 2023-08-28 DIAGNOSIS — R0602 Shortness of breath: Secondary | ICD-10-CM

## 2023-08-28 DIAGNOSIS — R918 Other nonspecific abnormal finding of lung field: Secondary | ICD-10-CM | POA: Diagnosis not present

## 2023-08-28 DIAGNOSIS — D869 Sarcoidosis, unspecified: Secondary | ICD-10-CM | POA: Diagnosis not present

## 2023-08-28 NOTE — Progress Notes (Signed)
 Subjective:   PATIENT ID: Casey Taylor GENDER: female DOB: 1935/08/21, MRN: [id number]  Chief Complaint  Patient presents with   Establish Care    Shortness of breath with activity, Sarcoidosis    Reason for Visit: New consult for shortness of breath  [name] is a 88 year old never smoker with HTN, sarcoid, osteoarthritis, CKD IIIa, anxiety who presents for consultation for shortness of breath.  She has had longstanding shortness of breath which she feels it has been stable for the last year. Daughter reports shortness of breath worsening with exertion in the last six months. Distances >200 ft seem to tire her out even with slow stead visits. Has previously seen by Cardiology for shortness of breath with negative exercise tolerance test and NM study that was negative. 2022 EF 65%. Prior jobs with chemical exposures for >20 years and did not have symptoms related to. She uses albuterol inhaler up to 1-2 times a month. Denies coughing or wheezing. Daughter reports episode bronchitis x 2 weeks in September. Felt symptoms resolved. 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  Past Medical History:  Diagnosis Date   Acute bilateral low back pain with right-sided sciatica 08/23/2021   Anxiety    Arthritis    Atypical chest pain 11/12/2017   Bacterial upper respiratory infection 06/15/2022   Breast cancer (HCC)    Essential hypertension 11/12/2017   Gout    Hypertension    dr shelton   Primary localized osteoarthritis of left knee 01/26/2019   Stroke [facility])    tia    06     Family History  Problem Relation Age of Onset   Heart attack Mother    Diabetes Mother    Dementia Mother    Heart attack Father    Hypertension Sister     Diabetes Sister    Diabetes Brother    Kidney disease Brother    Esophageal cancer Brother    Lung cancer Brother    Prostate cancer Brother    Diabetes Brother      Social History   Occupational History   Not on file  Tobacco Use   Smoking status: Never   Smokeless tobacco: Never  Vaping Use   Vaping status: Never Used  Substance and Sexual Activity   Alcohol use: Yes    Alcohol/week: 1.0 standard drink of alcohol    Types: 1 Glasses of wine per week    Comment: weekly   Drug use: No   Sexual activity: Not on file    Allergies  Allergen Reactions   Codeine Nausea Only     Outpatient Medications Prior to Visit  Medication Sig Dispense Refill   albuterol (VENTOLIN HFA) 108 (90 Base) MCG/ACT inhaler Inhale 1-2 puffs into the lungs every 4 (four) hours as needed for wheezing or shortness of breath. 8 g 0   allopurinol (ZYLOPRIM) 100 MG tablet Take 1 tablet (100 mg total) by mouth daily. 90 tablet 1   ALPRAZolam (XANAX) 0.25 MG tablet Take 1 tablet by mouth twice daily as needed 30 tablet 0   aspirin 81 MG EC tablet aspirin 81 mg tablet,delayed release  TAKE 1 TABLET BY MOUTH ONCE DAILY SWALLOW WHOLE 90 tablet 1   Cholecalciferol (VITAMIN D PO) Take 2,000 Units by mouth daily.  colchicine 0.6 MG tablet Take 1 tablet (0.6 mg total) by mouth daily as needed (gout). 30 tablet 1   dapagliflozin propanediol (FARXIGA) 5 MG TABS tablet Take 1 tablet (5 mg total) by mouth daily. 90 tablet 3   hydrocortisone 2.5 % cream      losartan (COZAAR) 25 MG tablet Take 1 tablet (25 mg total) by mouth daily. 90 tablet 3   Magnesium 250 MG TABS Take 250 mg by mouth.      meloxicam (MOBIC) 7.5 MG tablet Take 1 tablet (7.5 mg total) by mouth daily as needed for pain. 30 tablet 11   pantoprazole (PROTONIX) 40 MG tablet Take 1 tablet (40 mg total) by mouth daily. 30 tablet 3   pravastatin (PRAVACHOL) 40 MG tablet Take 1 tablet (40 mg total) by mouth every evening. 90 tablet 3   traMADol  (ULTRAM) 50 MG tablet Take 1 tablet (50 mg total) by mouth every 6 (six) hours as needed. 20 tablet 0   triamcinolone (KENALOG) 0.025 % ointment Apply topically.     Facility-Administered Medications Prior to Visit  Medication Dose Route Frequency Provider Last Rate Last Admin   betamethasone acetate-betamethasone sodium phosphate (CELESTONE) injection 3 mg  3 mg Intra-articular Once Felecia Shelling, DPM        ROS   Objective:   Vitals:   08/28/23 1314  BP: 120/78  Pulse: 68  SpO2: 97%  Weight: 193 lb (87.5 kg)  Height: 5\' 3"  (1.6 m)   SpO2: 97 %  Physical Exam: General: Well-appearing, no acute distress HENT: [place], AT Eyes: EOMI, no scleral icterus Respiratory: Clear to auscultation bilaterally.  No crackles, wheezing or rales Cardiovascular: RRR, -M/R/G, no JVD Extremities:-Edema,-tenderness Neuro: AAO x4, CNII-XII grossly intact Psych: Normal mood, normal affect  Data Reviewed:  Imaging: CXR 02/09/21 - subsegemental left basilar atelectasis  PFT: None on file  Labs: CBC    Component Value Date/Time   WBC 5.3 07/08/2023 1110   WBC 6.0 02/11/2021 0315   RBC 4.32 07/08/2023 1110   RBC 3.57 (L) 02/11/2021 0315   HGB 13.1 07/08/2023 1110   HGB 12.8 03/03/2014 1000   HCT 40.8 07/08/2023 1110   HCT 39.1 03/03/2014 1000   PLT 301 07/08/2023 1110   MCV 94 07/08/2023 1110   MCV 92.9 03/03/2014 1000   MCH 30.3 07/08/2023 1110   MCH 30.8 02/11/2021 0315   MCHC 32.1 07/08/2023 1110   MCHC 32.7 02/11/2021 0315   RDW 12.0 07/08/2023 1110   RDW 12.8 03/03/2014 1000   LYMPHSABS 1.7 07/08/2023 1110   LYMPHSABS 1.9 03/03/2014 1000   MONOABS 0.5 02/11/2021 0315   MONOABS 0.5 03/03/2014 1000   EOSABS 0.3 07/08/2023 1110   BASOSABS 0.1 07/08/2023 1110   BASOSABS 0.1 03/03/2014 1000        Assessment & Plan:   Discussion: 88 year old never smoker with HTN, sarcoid, osteoarthritis, CKD IIIa, anxiety who presents for consultation for shortness of breath. Reviewed  history. Likely multifactorial causes for dyspnea. Possible reactive airway disease  development or scarring with prior chemical exposure. Had recent URI that may have worsened dyspnea. Deconditioning suspected since sedentary at baseline. If PFTs negative would re-visit cardiac work-up. OK to use albuterol as needed for symptomatic relief.   Shortness of breath: Differential includes possible obstructive lung disease, restrictive lung disease, deconditioning --ORDER pulmonary function tests. Will call with results and determine if follow-up needed --ORDER CXR --If initial work-up neg, consider echocardiogram or return to Cardiology  Health Maintenance Immunization History  Administered Date(s) Administered   Fluad Quad(high Dose 65+) 02/26/2023   Influenza,inj,Quad PF,6+ Mos 02/22/2022   Influenza-Unspecified 02/16/2014, 02/16/2021   PFIZER Comirnaty(Gray Top)Covid-19 Tri-Sucrose Vaccine 09/27/2020   PFIZER(Purple Top)SARS-COV-2 Vaccination 07/08/2019, 07/29/2019, 03/25/2020   Pfizer Covid-19 Vaccine Bivalent Booster 29yrs & up 03/14/2021   Pfizer(Comirnaty)Fall Seasonal Vaccine 12 years and older 02/26/2023   Pneumococcal Conjugate-13 08/17/2011   RSV,unspecified 05/31/2022   Respiratory Syncytial Virus Vaccine,Recomb Aduvanted(Arexvy) 10/22/2022   Zoster Recombinant(Shingrix) 10/01/2019   CT Lung Screen - not qualified  Orders Placed This Encounter  Procedures   DG Chest 2 View    Standing Status:   Future    Number of Occurrences:   1    Expiration Date:   08/27/2024    Reason for Exam (SYMPTOM  OR DIAGNOSIS REQUIRED):   sarcoidosis    Preferred imaging location?:   [facility]   Pulmonary function test    Standing Status:   Future    Expiration Date:   08/27/2024    Where should this test be performed?:   [facility]    Full PFT: includes the following: basic spirometry, spirometry pre & post bronchodilator, diffusion capacity (DLCO), lung volumes:   Full PFT   No orders of the defined types were placed in this encounter.   Return if symptoms worsen or fail to improve.  I have spent a total time of 35-minutes on the day of the appointment reviewing prior documentation, coordinating care and discussing medical diagnosis and plan with the patient/family. Imaging, labs and tests included in this note have been reviewed and interpreted independently by me.  Kelley Polinsky Mechele Collin, MD [facility] 08/28/2023 1:56 PM

## 2023-08-28 NOTE — Patient Instructions (Addendum)
 Shortness of breath: Differential includes possible obstructive lung disease, restrictive lung disease, deconditioning --ORDER pulmonary function tests. Will call with results and determine if follow-up needed --ORDER CXR --If initial work-up neg, consider echocardiogram or return to Cardiology

## 2023-08-29 ENCOUNTER — Telehealth (HOSPITAL_BASED_OUTPATIENT_CLINIC_OR_DEPARTMENT_OTHER): Payer: Self-pay | Admitting: Pulmonary Disease

## 2023-08-29 NOTE — Telephone Encounter (Signed)
 Please contact patient regarding CXR: No significant infiltrate or fluid to explain shortness of breath. You are scheduled for pulmonary function tests. Our office will contact her afterwards to discuss results and determine treatment/follow-up if needed.

## 2023-08-30 NOTE — Telephone Encounter (Signed)
 Pt.notified

## 2023-09-18 ENCOUNTER — Ambulatory Visit (HOSPITAL_BASED_OUTPATIENT_CLINIC_OR_DEPARTMENT_OTHER): Admitting: Pulmonary Disease

## 2023-09-18 DIAGNOSIS — R0602 Shortness of breath: Secondary | ICD-10-CM | POA: Diagnosis not present

## 2023-09-18 LAB — PULMONARY FUNCTION TEST
DL/VA % pred: 112 %
DL/VA: 4.6 ml/min/mmHg/L
DLCO cor % pred: 95 %
DLCO cor: 17.08 ml/min/mmHg
DLCO unc % pred: 95 %
DLCO unc: 17.08 ml/min/mmHg
FEF 25-75 Post: 2.1 L/s
FEF 25-75 Pre: 1.53 L/s
FEF2575-%Change-Post: 37 %
FEF2575-%Pred-Post: 192 %
FEF2575-%Pred-Pre: 139 %
FEV1-%Change-Post: 10 %
FEV1-%Pred-Post: 96 %
FEV1-%Pred-Pre: 87 %
FEV1-Post: 1.6 L
FEV1-Pre: 1.45 L
FEV1FVC-%Change-Post: -3 %
FEV1FVC-%Pred-Pre: 113 %
FEV6-%Change-Post: 14 %
FEV6-%Pred-Post: 95 %
FEV6-%Pred-Pre: 82 %
FEV6-Post: 2.02 L
FEV6-Pre: 1.76 L
FEV6FVC-%Pred-Post: 106 %
FEV6FVC-%Pred-Pre: 106 %
FVC-%Change-Post: 14 %
FVC-%Pred-Post: 89 %
FVC-%Pred-Pre: 77 %
FVC-Post: 2.02 L
FVC-Pre: 1.76 L
Post FEV1/FVC ratio: 79 %
Post FEV6/FVC ratio: 100 %
Pre FEV1/FVC ratio: 82 %
Pre FEV6/FVC Ratio: 100 %
RV % pred: 87 %
RV: 2.13 L
TLC % pred: 88 %
TLC: 4.33 L

## 2023-09-18 NOTE — Progress Notes (Signed)
 Full PFT Performed Today

## 2023-09-18 NOTE — Patient Instructions (Signed)
 Full PFT Performed Today

## 2023-10-01 ENCOUNTER — Ambulatory Visit: Payer: Medicare Other

## 2023-10-01 DIAGNOSIS — Z0000 Encounter for general adult medical examination without abnormal findings: Secondary | ICD-10-CM | POA: Diagnosis not present

## 2023-10-01 NOTE — Patient Instructions (Signed)
 Ms. Peloso , Thank you for taking time to come for your Medicare Wellness Visit. I appreciate your ongoing commitment to your health goals. Please review the following plan we discussed and let me know if I can assist you in the future.   Referrals/Orders/Follow-Ups/Clinician Recommendations: none  This is a list of the screening recommended for you and due dates:  Health Maintenance  Topic Date Due   DTaP/Tdap/Td vaccine (1 - Tdap) Never done   Pneumonia Vaccine (2 of 2 - PPSV23) 10/12/2011   Zoster (Shingles) Vaccine (2 of 2) 11/26/2019   COVID-19 Vaccine (7 - 2024-25 season) 08/26/2023   Flu Shot  01/17/2024   Medicare Annual Wellness Visit  09/30/2024   DEXA scan (bone density measurement)  Completed   HPV Vaccine  Aged Out   Meningitis B Vaccine  Aged Out    Advanced directives: (Copy Requested) Please bring a copy of your health care power of attorney and living will to the office to be added to your chart at your convenience. You can mail to [facility] [address] [place], [place] [address] or email to [email or web address]  Next Medicare Annual Wellness Visit scheduled for next year: Yes  insert Preventive Care attachment Insert FALL PREVENTION attachment if needed

## 2023-10-01 NOTE — Progress Notes (Signed)
 Subjective:   Keaghan Bowens Cosens is a 88 y.o. who presents for a Medicare Wellness preventive visit.  Visit Complete: Virtual I connected with  Asyia Hornung Grandmaison on 10/01/23 by a audio enabled telemedicine application and verified that I am speaking with the correct person using two identifiers.  Patient Location: Home  Provider Location: Office/Clinic  I discussed the limitations of evaluation and management by telemedicine. The patient expressed understanding and agreed to proceed.  Vital Signs: Because this visit was a virtual/telehealth visit, some criteria may be missing or patient reported. Any vitals not documented were not able to be obtained and vitals that have been documented are patient reported.  VideoError- Librarian, academic were attempted between this provider and patient, however failed, due to patient having technical difficulties OR patient did not have access to video capability.  We continued and completed visit with audio only.   Persons Participating in Visit: Patient.  AWV Questionnaire: No: Patient Medicare AWV questionnaire was not completed prior to this visit.  Cardiac Risk Factors include: advanced age (>49men, >36 women);dyslipidemia;hypertension     Objective:    Today's Vitals   There is no height or weight on file to calculate BMI.     10/01/2023    3:32 PM 09/25/2022    9:53 AM 02/10/2021    6:14 AM 01/26/2019   10:00 AM 01/20/2019    1:38 PM 04/18/2016    3:31 PM 07/08/2015    9:50 AM  Advanced Directives  Does Patient Have a Medical Advance Directive? Yes Yes Yes Yes Yes No No  Type of Estate agent of [place];Living will Healthcare Power of [place];Living will Living will Healthcare Power of [place];Living will Healthcare Power of [place];Living will    Does patient want to make changes to medical advance directive?   No - Patient declined No - Patient declined No - Patient declined     Copy of Healthcare Power of Attorney in Chart? No - copy requested No - copy requested   No - copy requested    Would patient like information on creating a medical advance directive?       No - patient declined information    Current Medications (verified) Outpatient Encounter Medications as of 10/01/2023  Medication Sig   albuterol (VENTOLIN HFA) 108 (90 Base) MCG/ACT inhaler Inhale 1-2 puffs into the lungs every 4 (four) hours as needed for wheezing or shortness of breath.   allopurinol (ZYLOPRIM) 100 MG tablet Take 1 tablet (100 mg total) by mouth daily.   ALPRAZolam (XANAX) 0.25 MG tablet Take 1 tablet by mouth twice daily as needed   aspirin 81 MG EC tablet aspirin 81 mg tablet,delayed release  TAKE 1 TABLET BY MOUTH ONCE DAILY SWALLOW WHOLE   Cholecalciferol (VITAMIN D PO) Take 2,000 Units by mouth daily.    colchicine 0.6 MG tablet Take 1 tablet (0.6 mg total) by mouth daily as needed (gout).   dapagliflozin propanediol (FARXIGA) 5 MG TABS tablet Take 1 tablet (5 mg total) by mouth daily.   hydrocortisone 2.5 % cream    losartan (COZAAR) 25 MG tablet Take 1 tablet (25 mg total) by mouth daily.   Magnesium 250 MG TABS Take 250 mg by mouth.    meloxicam (MOBIC) 7.5 MG tablet Take 1 tablet (7.5 mg total) by mouth daily as needed for pain.   pantoprazole (PROTONIX) 40 MG tablet Take 1 tablet (40 mg total) by mouth daily.   pravastatin (PRAVACHOL) 40 MG  tablet Take 1 tablet (40 mg total) by mouth every evening.   traMADol (ULTRAM) 50 MG tablet Take 1 tablet (50 mg total) by mouth every 6 (six) hours as needed.   triamcinolone (KENALOG) 0.025 % ointment Apply topically.   Facility-Administered Encounter Medications as of 10/01/2023  Medication   betamethasone acetate-betamethasone sodium phosphate (CELESTONE) injection 3 mg    Allergies (verified) Codeine   History: Past Medical History:  Diagnosis Date   Acute bilateral low back pain with right-sided sciatica 08/23/2021    Anxiety    Arthritis    Atypical chest pain 11/12/2017   Bacterial upper respiratory infection 06/15/2022   Breast cancer (HCC)    Essential hypertension 11/12/2017   Gout    Hypertension    dr shelton   Primary localized osteoarthritis of left knee 01/26/2019   Stroke [facility])    tia    06   Past Surgical History:  Procedure Laterality Date   ABDOMINAL HYSTERECTOMY     BREAST BIOPSY Left 05/26/2013   Procedure: BREAST BIOPSY WITH NEEDLE LOCALIZATION;  Surgeon: Almond Lint, MD;  Location: [facility] OR;  Service: General;  Laterality: Left;   BREAST LUMPECTOMY Right 2014   CHOLECYSTECTOMY     PARTIAL MASTECTOMY WITH NEEDLE LOCALIZATION Right 05/26/2013   Procedure: PARTIAL MASTECTOMY WITH NEEDLE LOCALIZATION;  Surgeon: Almond Lint, MD;  Location: [facility] OR;  Service: General;  Laterality: Right;   TOTAL KNEE ARTHROPLASTY Left 01/26/2019   Procedure: TOTAL KNEE ARTHROPLASTY;  Surgeon: Salvatore Marvel, MD;  Location: [facility] ORS;  Service: Orthopedics;  Laterality: Left;   Family History  Problem Relation Age of Onset   Heart attack Mother    Diabetes Mother    Dementia Mother    Heart attack Father    Hypertension Sister    Diabetes Sister    Diabetes Brother    Kidney disease Brother    Esophageal cancer Brother    Lung cancer Brother    Prostate cancer Brother    Diabetes Brother    Social History   Socioeconomic History   Marital status: Divorced    Spouse name: Not on file   Number of children: 5   Years of education: Not on file   Highest education level: Some college, no degree  Occupational History   Not on file  Tobacco Use   Smoking status: Never   Smokeless tobacco: Never  Vaping Use   Vaping status: Never Used  Substance and Sexual Activity   Alcohol use: Yes    Alcohol/week: 1.0 standard drink of alcohol    Types: 1 Glasses of wine per week    Comment: weekly   Drug use: No   Sexual activity: Not on file  Other Topics Concern   Not on file  Social History  Narrative   Lives at home alone    Caffeine: coffee maybe 1 cup daily   Right handed   Social Drivers of Health   Financial Resource Strain: Low Risk  (10/01/2023)   Overall Financial Resource Strain (CARDIA)    Difficulty of Paying Living Expenses: Not hard at all  Food Insecurity: No Food Insecurity (10/01/2023)   Hunger Vital Sign    Worried About Running Out of Food in the Last Year: Never true    Ran Out of Food in the Last Year: Never true  Transportation Needs: No Transportation Needs (10/01/2023)   PRAPARE - Administrator, Civil Service (Medical): No    Lack of Transportation (Non-Medical): No  Physical Activity: Insufficiently Active (10/01/2023)   Exercise Vital Sign    Days of Exercise per Week: 3 days    Minutes of Exercise per Session: 20 min  Stress: No Stress Concern Present (10/01/2023)   [employer] of Occupational Health - Occupational Stress Questionnaire    Feeling of Stress : Not at all  Social Connections: Moderately Integrated (10/01/2023)   Social Connection and Isolation Panel [NHANES]    Frequency of Communication with Friends and Family: Three times a week    Frequency of Social Gatherings with Friends and Family: Three times a week    Attends Religious Services: More than 4 times per year    Active Member of Clubs or Organizations: Yes    Attends Banker Meetings: Never    Marital Status: Divorced    Tobacco Counseling Counseling given: Not Answered    Clinical Intake:  Pre-visit preparation completed: Yes  Pain : No/denies pain     Nutritional Risks: None Diabetes: No  Lab Results  Component Value Date   HGBA1C 5.4 07/08/2023   HGBA1C 5.3 02/10/2021     How often do you need to have someone help you when you read instructions, pamphlets, or other written materials from your doctor or pharmacy?: 1 - Never  Interpreter Needed?: No  Information entered by :: NAllen LPN   Activities of Daily Living      10/01/2023    3:27 PM  In your present state of health, do you have any difficulty performing the following activities:  Hearing? 0  Vision? 0  Difficulty concentrating or making decisions? 0  Walking or climbing stairs? 0  Dressing or bathing? 0  Doing errands, shopping? 0  Preparing Food and eating ? N  Using the Toilet? N  In the past six months, have you accidently leaked urine? N  Do you have problems with loss of bowel control? N  Managing your Medications? N  Managing your Finances? N  Housekeeping or managing your Housekeeping? N    Patient Care Team: Early, Sung Amabile, NP as PCP - General (Nurse Practitioner) Chilton Si, MD as PCP - Cardiology (Cardiology)  Indicate any recent Medical Services you may have received from other than [facility] providers in the past year (date may be approximate).     Assessment:   This is a routine wellness examination for [place].  Hearing/Vision screen Hearing Screening - Comments:: Denies hearing issues Vision Screening - Comments:: Regular eye exams, [facility]   Goals Addressed             This Visit's Progress    Patient Stated       10/01/2023, denies goals       Depression Screen     10/01/2023    3:34 PM 07/08/2023    9:44 AM 10/22/2022   10:33 AM 09/25/2022    9:54 AM 04/30/2022   11:12 AM 02/22/2022   10:33 AM 08/23/2021    1:28 PM  PHQ 2/9 Scores  PHQ - 2 Score 0 0 0 0 0 0 0  PHQ- 9 Score 0   0 1 0     Fall Risk     10/01/2023    3:33 PM 07/08/2023    9:44 AM 10/22/2022   10:33 AM 09/25/2022    9:54 AM 04/30/2022   11:11 AM  Fall Risk   Falls in the past year? 0 0 0 0 0  Number falls in past yr: 0 0 0 0  Injury with Fall? 0 0 0 0   Risk for fall due to : Medication side effect;Impaired mobility No Fall Risks No Fall Risks Medication side effect;Impaired mobility   Follow up Falls prevention discussed;Falls evaluation completed Falls evaluation completed Falls evaluation completed Falls prevention  discussed;Education provided;Falls evaluation completed     MEDICARE RISK AT HOME:  Medicare Risk at Home Any stairs in or around the home?: No If so, are there any without handrails?: No Home free of loose throw rugs in walkways, pet beds, electrical cords, etc?: Yes Adequate lighting in your home to reduce risk of falls?: Yes Life alert?: No Use of a cane, walker or w/c?: Yes Grab bars in the bathroom?: Yes Shower chair or bench in shower?: Yes Elevated toilet seat or a handicapped toilet?: Yes  TIMED UP AND GO:  Was the test performed?  No  Cognitive Function: 6CIT completed    02/29/2020    1:39 PM  MMSE - Mini Mental State Exam  Orientation to time 5  Orientation to Place 4  Registration 3  Attention/ Calculation 0  Recall 3  Language- name 2 objects 2  Language- repeat 1  Language- follow 3 step command 3  Language- read & follow direction 1  Write a sentence 1  Copy design 0  Total score 23        10/01/2023    3:35 PM 09/25/2022    9:56 AM  6CIT Screen  What Year? 0 points 0 points  What month? 0 points 0 points  What time? 0 points 0 points  Count back from 20 0 points 0 points  Months in reverse 0 points 0 points  Repeat phrase 0 points 6 points  Total Score 0 points 6 points    Immunizations Immunization History  Administered Date(s) Administered   Fluad Quad(high Dose 65+) 02/26/2023   Influenza,inj,Quad PF,6+ Mos 02/22/2022   Influenza-Unspecified 02/16/2014, 02/16/2021   PFIZER Comirnaty(Gray Top)Covid-19 Tri-Sucrose Vaccine 09/27/2020   PFIZER(Purple Top)SARS-COV-2 Vaccination 07/08/2019, 07/29/2019, 03/25/2020   Pfizer Covid-19 Vaccine Bivalent Booster 4yrs & up 03/14/2021   Pfizer(Comirnaty)Fall Seasonal Vaccine 12 years and older 02/26/2023   Pneumococcal Conjugate-13 08/17/2011   RSV,unspecified 05/31/2022   Respiratory Syncytial Virus Vaccine,Recomb Aduvanted(Arexvy) 10/22/2022   Zoster Recombinant(Shingrix) 10/01/2019    Screening  Tests Health Maintenance  Topic Date Due   DTaP/Tdap/Td (1 - Tdap) Never done   Pneumonia Vaccine 70+ Years old (2 of 2 - PPSV23) 10/12/2011   Zoster Vaccines- Shingrix (2 of 2) 11/26/2019   COVID-19 Vaccine (7 - 2024-25 season) 08/26/2023   INFLUENZA VACCINE  01/17/2024   Medicare Annual Wellness (AWV)  09/30/2024   DEXA SCAN  Completed   HPV VACCINES  Aged Out   Meningococcal B Vaccine  Aged Out    Health Maintenance  Health Maintenance Due  Topic Date Due   DTaP/Tdap/Td (1 - Tdap) Never done   Pneumonia Vaccine 6+ Years old (2 of 2 - PPSV23) 10/12/2011   Zoster Vaccines- Shingrix (2 of 2) 11/26/2019   COVID-19 Vaccine (7 - 2024-25 season) 08/26/2023   Health Maintenance Items Addressed: Will discuss pneumonia at next appointment. TDAP and shingles will go to pharmacy.  Additional Screening:  Vision Screening: Recommended annual ophthalmology exams for early detection of glaucoma and other disorders of the eye.  Dental Screening: Recommended annual dental exams for proper oral hygiene  Community Resource Referral / Chronic Care Management: CRR required this visit?  No   CCM required this visit?  No  Plan:     I have personally reviewed and noted the following in the patient's chart:   Medical and social history Use of alcohol, tobacco or illicit drugs  Current medications and supplements including opioid prescriptions. Patient is not currently taking opioid prescriptions. Functional ability and status Nutritional status Physical activity Advanced directives List of other physicians Hospitalizations, surgeries, and ER visits in previous 12 months Vitals Screenings to include cognitive, depression, and falls Referrals and appointments  In addition, I have reviewed and discussed with patient certain preventive protocols, quality metrics, and best practice recommendations. A written personalized care plan for preventive services as well as general preventive  health recommendations were provided to patient.     Areatha Beecham, LPN   [id number]/62/[id number]   After Visit Summary: (Pick Up) Due to this being a telephonic visit, with patients personalized plan was offered to patient and patient has requested to Pick up at office.  Notes: Nothing significant to report at this time.

## 2023-10-04 DIAGNOSIS — Z23 Encounter for immunization: Secondary | ICD-10-CM | POA: Diagnosis not present

## 2023-10-20 ENCOUNTER — Other Ambulatory Visit: Payer: Self-pay | Admitting: Nurse Practitioner

## 2023-10-20 DIAGNOSIS — K30 Functional dyspepsia: Secondary | ICD-10-CM

## 2023-10-22 ENCOUNTER — Other Ambulatory Visit: Payer: Self-pay | Admitting: Nurse Practitioner

## 2023-10-22 DIAGNOSIS — Z1231 Encounter for screening mammogram for malignant neoplasm of breast: Secondary | ICD-10-CM

## 2023-10-31 ENCOUNTER — Ambulatory Visit (INDEPENDENT_AMBULATORY_CARE_PROVIDER_SITE_OTHER): Admitting: Pulmonary Disease

## 2023-10-31 ENCOUNTER — Encounter (HOSPITAL_BASED_OUTPATIENT_CLINIC_OR_DEPARTMENT_OTHER): Payer: Self-pay | Admitting: Pulmonary Disease

## 2023-10-31 VITALS — BP 126/54 | HR 80 | Ht 63.0 in | Wt 191.8 lb

## 2023-10-31 DIAGNOSIS — D869 Sarcoidosis, unspecified: Secondary | ICD-10-CM | POA: Diagnosis not present

## 2023-10-31 DIAGNOSIS — R0609 Other forms of dyspnea: Secondary | ICD-10-CM

## 2023-10-31 NOTE — Progress Notes (Signed)
   Subjective:    Patient ID: Casey Taylor, female    DOB: 1936-01-01, 88 y.o.   MRN: [id number]  HPI  88 year old never smoker with sarcoid,  who presents for consultation for shortness of breath.   PMH : osteoarthritis, CKD IIIa, anxiety  Initial OV [place] [facility] 08/2023  She experiences shortness of breath during exertion, such as walking short distances, which has persisted for two to three months. These episodes are accompanied by exhaustion and facial pallor.  Her history of sarcoidosis includes chest x-rays showing mild reticulonodular infiltrates. Pulmonary function tests revealed no restriction, a very mild bronchodilator response, particularly in FVC, and a normal DLCO. She has not undergone specific treatment for sarcoidosis. Earlier this year, she had a severe respiratory issue treated with breathing treatments and antibiotics, leading to improvement, but exertional dyspnea persists.  She uses an albuterol  inhaler as needed, which provides symptom relief. There is no wheezing, chest tightness, or significant allergies. There is no history of blood clots, and cardiac evaluations three years ago were normal.  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  DIAGNOSTIC Pulmonary Function Test: No evidence of restriction; very mild response to bronchodilator in FVC; normal DLCO (08/2023) Echocardiography: No evidence of pulmonary hypertension (2022)   Review of Systems neg for any significant sore throat, dysphagia, itching, sneezing, nasal congestion or excess/ purulent secretions, fever, chills, sweats, unintended wt loss, pleuritic or exertional cp, hempoptysis, orthopnea pnd or change in chronic leg swelling. Also denies presyncope, palpitations, heartburn, abdominal pain, nausea, vomiting, diarrhea or change in bowel or urinary habits, dysuria,hematuria, rash,  arthralgias, visual complaints, headache, numbness weakness or ataxia.     Objective:   Physical Exam  Gen. Pleasant, well-nourished, in no distress ENT - no thrush, no pallor/icterus,no post nasal drip Neck: No JVD, no thyromegaly, no carotid bruits Lungs: no use of accessory muscles, no dullness to percussion, clear without rales or rhonchi  Cardiovascular: Rhythm regular, heart sounds  normal, no murmurs or gallops, no peripheral edema Musculoskeletal: No deformities, no cyanosis or clubbing        Assessment & Plan:    Shortness of breath Shortness of breath primarily during ambulation, persisting for two to three months. No wheezing, chest tightness, or cardiac issues. Differential includes deconditioning, pulmonary emboli, and pulmonary hypertension, though pulmonary emboli are unlikely. Previous echocardiogram showed no pulmonary hypertension. Slightly elevated D-dimer in 2022. If CT angiogram is normal, focus will shift to improving conditioning through exercise. - Administer albuterol  prior to exercise to improve breathing. - Initiate graduated exercise program to enhance conditioning if CT angiogram is normal. - Consider supervised [facility] program at [facility] if feasible. - Monitor oxygen  saturation during ambulation.  Sarcoidosis Sarcoidosis with mild reticulonodular infiltrates on chest x-ray. Unclear if chronic. Pulmonary function tests show no restriction and normal diffusing capacity. No significant bronchodilator response. Sarcoidosis not a major issue currently. CT angiogram will assess sarcoidosis and rule out pulmonary emboli, which is unlikely. - Order CT angiogram to assess sarcoidosis and rule out pulmonary emboli. - Evaluate for scarring and clarify haziness on x-ray.

## 2023-10-31 NOTE — Patient Instructions (Signed)
 VISIT SUMMARY:  Today, you were seen for your ongoing shortness of breath, which has been troubling you for the past two to three months, especially during physical activities. We discussed your history of sarcoidosis and reviewed your previous tests and treatments. We also talked about your current use of an albuterol  inhaler and your occupational exposure history.  YOUR PLAN:  -SHORTNESS OF BREATH: Shortness of breath means you have difficulty breathing, especially when you are active. We will start by using your albuterol  inhaler before exercise to help with your breathing. If your CT angiogram is normal, we will focus on improving your physical conditioning through a graduated exercise program. You might also consider joining a supervised exercise program at your local [facility]. We will monitor your oxygen  levels while you walk to ensure they stay within a safe range.  -SARCOIDOSIS: Sarcoidosis is a condition where small clusters of inflammatory cells grow in different parts of your body, often in the lungs. Your chest x-ray shows mild changes, but your lung function tests are mostly normal. We will do a CT angiogram to get a clearer picture of your lungs and to rule out any blood clots. This will help us  understand if there is any scarring or other issues that need attention.

## 2023-11-12 ENCOUNTER — Ambulatory Visit: Payer: Self-pay | Admitting: Pulmonary Disease

## 2023-11-12 ENCOUNTER — Ambulatory Visit (HOSPITAL_BASED_OUTPATIENT_CLINIC_OR_DEPARTMENT_OTHER)
Admission: RE | Admit: 2023-11-12 | Discharge: 2023-11-12 | Disposition: A | Discharge status: Home / Self Care | Source: Ambulatory Visit | Attending: Pulmonary Disease | Admitting: Pulmonary Disease

## 2023-11-12 ENCOUNTER — Encounter (HOSPITAL_BASED_OUTPATIENT_CLINIC_OR_DEPARTMENT_OTHER): Payer: Self-pay

## 2023-11-12 DIAGNOSIS — R0609 Other forms of dyspnea: Secondary | ICD-10-CM | POA: Diagnosis not present

## 2023-11-12 DIAGNOSIS — D869 Sarcoidosis, unspecified: Secondary | ICD-10-CM | POA: Diagnosis not present

## 2023-11-12 DIAGNOSIS — R0602 Shortness of breath: Secondary | ICD-10-CM | POA: Diagnosis not present

## 2023-11-12 MED ORDER — IOHEXOL 350 MG/ML SOLN
100.0000 mL | Freq: Once | INTRAVENOUS | Status: AC | PRN
  Administered 2023-11-12: 75 mL via INTRAVENOUS

## 2023-11-13 NOTE — Progress Notes (Signed)
 Pt.notified

## 2023-12-02 ENCOUNTER — Ambulatory Visit
Admission: RE | Admit: 2023-12-02 | Discharge: 2023-12-02 | Disposition: A | Discharge status: Home / Self Care | Source: Ambulatory Visit | Attending: Nurse Practitioner | Admitting: Nurse Practitioner

## 2023-12-02 DIAGNOSIS — Z1231 Encounter for screening mammogram for malignant neoplasm of breast: Secondary | ICD-10-CM | POA: Diagnosis not present

## 2023-12-03 ENCOUNTER — Ambulatory Visit

## 2023-12-05 ENCOUNTER — Ambulatory Visit: Payer: Self-pay | Admitting: Nurse Practitioner

## 2024-01-15 ENCOUNTER — Encounter: Payer: Self-pay | Admitting: Nurse Practitioner

## 2024-01-15 ENCOUNTER — Ambulatory Visit (INDEPENDENT_AMBULATORY_CARE_PROVIDER_SITE_OTHER): Payer: Medicare Other | Admitting: Nurse Practitioner

## 2024-01-15 VITALS — BP 124/82 | HR 76 | Wt 193.6 lb

## 2024-01-15 DIAGNOSIS — L810 Postinflammatory hyperpigmentation: Secondary | ICD-10-CM | POA: Diagnosis not present

## 2024-01-15 DIAGNOSIS — N1831 Chronic kidney disease, stage 3a: Secondary | ICD-10-CM

## 2024-01-15 DIAGNOSIS — R198 Other specified symptoms and signs involving the digestive system and abdomen: Secondary | ICD-10-CM | POA: Diagnosis not present

## 2024-01-15 DIAGNOSIS — F419 Anxiety disorder, unspecified: Secondary | ICD-10-CM | POA: Diagnosis not present

## 2024-01-15 DIAGNOSIS — M1A00X0 Idiopathic chronic gout, unspecified site, without tophus (tophi): Secondary | ICD-10-CM | POA: Diagnosis not present

## 2024-01-15 DIAGNOSIS — I10 Essential (primary) hypertension: Secondary | ICD-10-CM

## 2024-01-15 DIAGNOSIS — E559 Vitamin D deficiency, unspecified: Secondary | ICD-10-CM

## 2024-01-15 DIAGNOSIS — E782 Mixed hyperlipidemia: Secondary | ICD-10-CM

## 2024-01-15 LAB — LP+LDL DIRECT

## 2024-01-15 MED ORDER — ALLOPURINOL 100 MG PO TABS
100.0000 mg | ORAL_TABLET | Freq: Every day | ORAL | 1 refills | Status: AC
Start: 2024-01-15 — End: ?

## 2024-01-15 MED ORDER — LOSARTAN POTASSIUM 25 MG PO TABS: 25.0000 mg | ORAL_TABLET | Freq: Every day | ORAL | 3 refills | Status: AC

## 2024-01-15 MED ORDER — DAPAGLIFLOZIN PROPANEDIOL 5 MG PO TABS: 5.0000 mg | ORAL_TABLET | Freq: Every day | ORAL | 3 refills | Status: AC

## 2024-01-15 MED ORDER — NIACINAMIDE 4 % EX CREA: TOPICAL_CREAM | CUTANEOUS | 0 refills | Status: AC

## 2024-01-15 MED ORDER — ALPRAZOLAM 0.25 MG PO TABS: 0.2500 mg | ORAL_TABLET | Freq: Two times a day (BID) | ORAL | 0 refills | Status: AC | PRN

## 2024-01-15 NOTE — Progress Notes (Signed)
 Catheline Doing, DNP, AGNP-c [facility]  [address] [place], [place] [address] [phone]  ESTABLISHED PATIENT- Chronic Health and/or Follow-Up Visit  Blood pressure 124/82, pulse 76, weight 193 lb 9.6 oz (87.8 kg).    History of Present Illness Casey Taylor is an 88 year old female who presents for routine follow-up. She expresses concerns with a bubbling sensation in her stomach.  She experiences a bubbling sensation in her stomach, which is noisy but not painful. The sensation sometimes worsens after eating certain foods, but she isn't sure of what foods in particular make it worse. No diarrhea or constipation is present.  Her current medications include Farxiga , allopurinol , losartan , pantoprazole , pravastatin , and alprazolam . Colchicine  is used only during flare-ups of gout.  She experiences occasional dark marks on her skin that develop a 'pimply head' and leave dark marks/scars, particularly on her arms. These do not hurt but flare up occasionally.  No headaches, vision changes, or significant swelling in her feet, although she notes her left foot is a 'little puffy foot' that is not severe.   She experiences some shortness of breath but notes it is not worse than usual. She was advised by pulmonology to take Mucinex twice a day for mucus build-up in the lungs.   She engages in regular exercise, primarily walking, and prefers to do so in the evening to avoid the heat.   All ROS negative with exception of what is listed above.   PHYSICAL EXAM Physical Exam Vitals and nursing note reviewed.  Constitutional:      Appearance: Normal appearance.  HENT:     Head: Normocephalic.  Eyes:     Pupils: Pupils are equal, round, and reactive to light.  Neck:     Vascular: No carotid bruit.  Cardiovascular:     Rate and Rhythm: Normal rate and regular rhythm.     Pulses: Normal pulses.     Heart sounds: Normal heart sounds. No murmur  heard. Pulmonary:     Effort: Pulmonary effort is normal.     Breath sounds: Normal breath sounds. No wheezing or rhonchi.  Musculoskeletal:        General: Normal range of motion.     Cervical back: Normal range of motion.     Left lower leg: Edema present.  Skin:    General: Skin is warm.  Neurological:     General: No focal deficit present.     Mental Status: She is alert and oriented to person, place, and time.  Psychiatric:        Mood and Affect: Mood normal.      PLAN Problem List Items Addressed This Visit     Gout   Refill provided on allopurinol  for daily prevention. No recent attacks.       Relevant Medications   allopurinol  (ZYLOPRIM ) 100 MG tablet   Essential hypertension - Primary   Blood pressures are well controlled with losartan  25mg  daily. No alarm symptoms at this time. Continue current medication. Labs pending.       Relevant Medications   losartan  (COZAAR ) 25 MG tablet   Other Relevant Orders   Comprehensive metabolic panel with GFR   LP+LDL Direct   Anxiety   Anxiety disorder managed with alprazolam  as needed. No recent refill of alprazolam  noted (last 06/2023). - Refill alprazolam  prescription.      Relevant Medications   ALPRAZolam  (XANAX ) 0.25 MG tablet   Vitamin D  deficiency, unspecified   Repeat labs.       Relevant  Orders   VITAMIN D  25 Hydroxy (Vit-D Deficiency, Fractures)   Mixed hyperlipidemia   Repeat labs today. Managed with pravastatin  without concern.       Relevant Medications   losartan  (COZAAR ) 25 MG tablet   Other Relevant Orders   Comprehensive metabolic panel with GFR   LP+LDL Direct   Chronic renal failure, stage 3a (HCC)   Chronic kidney disease with slight improvement in kidney function after starting Farxiga . Farxiga  is being used for kidney protection. No side effects reported and insurance covers the medication well. Concern that stopping Farxiga  may lead to a decline in kidney function. We discussed the  importance of continuing this medication. If this medication becomes cost prohibitive, I am happy to see if we can get patient assistance or change to Jardiance.  - Continue Farxiga  for kidney protection. - Check kidney function with labs.       Relevant Medications   dapagliflozin  propanediol (FARXIGA ) 5 MG TABS tablet   Other Relevant Orders   CBC with Differential/Platelet   Comprehensive metabolic panel with GFR   Post-inflammatory hyperpigmentation   Recurrent folliculitis leading to hyperpigmentation, especially on the arms. Occasional pimply heads noted on lesions. No itching or burning reported. - Prescribe niacinamide  cream to apply twice daily to affected areas to reduce appearance of scarring  - Avoid picking at areas when they appear.       Relevant Medications   Niacinamide  4 % CREA   Stomach growling   Intermittent bubbling in the stomach without pain, diarrhea, or constipation. Etiology unknown. No harmful effects noted. Consider loud digestion, gas swallowing, or food irritation. She will let me know if symptoms change  - Advise to avoid foods that exacerbate symptoms.       Return in about 6 months (around 07/17/2024) for CPE.  Catheline Doing, DNP, AGNP-c

## 2024-01-15 NOTE — Assessment & Plan Note (Signed)
 Recurrent folliculitis leading to hyperpigmentation, especially on the arms. Occasional pimply heads noted on lesions. No itching or burning reported. - Prescribe niacinamide  cream to apply twice daily to affected areas to reduce appearance of scarring  - Avoid picking at areas when they appear.

## 2024-01-15 NOTE — Assessment & Plan Note (Signed)
 Chronic kidney disease with slight improvement in kidney function after starting Farxiga . Farxiga  is being used for kidney protection. No side effects reported and insurance covers the medication well. Concern that stopping Farxiga  may lead to a decline in kidney function. We discussed the importance of continuing this medication. If this medication becomes cost prohibitive, I am happy to see if we can get patient assistance or change to Jardiance.  - Continue Farxiga  for kidney protection. - Check kidney function with labs.

## 2024-01-15 NOTE — Assessment & Plan Note (Signed)
 Blood pressures are well controlled with losartan  25mg  daily. No alarm symptoms at this time. Continue current medication. Labs pending.

## 2024-01-15 NOTE — Assessment & Plan Note (Signed)
 Refill provided on allopurinol  for daily prevention. No recent attacks.

## 2024-01-15 NOTE — Patient Instructions (Addendum)
 It was so good to see you today!  VISIT SUMMARY:  Today, we discussed the bubbling sensation in your stomach, your chronic kidney disease, generalized osteoarthritis, mild lymphedema, hyperpigmentation from recurrent folliculitis, and your anxiety disorder. We reviewed your current medications and made some adjustments to your treatment plan.  YOUR PLAN:  -IRRITABLE BOWEL SYNDROME WITH PREDOMINANT BLOATING: Irritable bowel syndrome (IBS) is a common disorder that affects the stomach and intestine, it can cause symptoms like bloating, gas, noises, and stomach discomfort. To help manage your symptoms, avoid foods that seem to make the bubbling sensation worse.  -CHRONIC KIDNEY DISEASE: Chronic kidney disease means your kidneys are not working as well as they should. Your kidney function has slightly improved since starting Farxiga , which helps protect your kidneys. Continue taking Farxiga  and we will check your kidney function with lab tests.  -GENERALIZED OSTEOARTHRITIS: Osteoarthritis is a condition that causes joints to become painful and stiff. You have occasional pain in your ankle joint, which is consistent with arthritis. Continue your current management plan.  -LYMPHEDEMA OF LOWER EXTREMITY: Lymphedema is swelling that generally occurs in one of your arms or legs. You have mild swelling in your lower extremity. Continue your current management plan.  -HYPERPIGMENTATION: Recurrent bumps cause dark marks on the skin. You have been experiencing this on your arms. Apply niacinamide  cream to the affected areas twice daily to help with the hyperpigmentation.  -ANXIETY DISORDER: Anxiety disorder is a mental health condition that causes feelings of worry and fear. You are managing this with alprazolam  as needed. Your prescription for alprazolam  has been refilled.  INSTRUCTIONS:  Please follow up with lab tests to check your kidney function. Continue to monitor your symptoms and avoid foods that  worsen your IBS. Apply niacinamide  cream twice daily to areas affected by hyperpigmentation. Refill your alprazolam  prescription as needed.

## 2024-01-15 NOTE — Assessment & Plan Note (Signed)
 Anxiety disorder managed with alprazolam  as needed. No recent refill of alprazolam  noted (last 06/2023). - Refill alprazolam  prescription.

## 2024-01-15 NOTE — Assessment & Plan Note (Signed)
 Repeat labs:

## 2024-01-15 NOTE — Assessment & Plan Note (Signed)
 Intermittent bubbling in the stomach without pain, diarrhea, or constipation. Etiology unknown. No harmful effects noted. Consider loud digestion, gas swallowing, or food irritation. She will let me know if symptoms change  - Advise to avoid foods that exacerbate symptoms.

## 2024-01-15 NOTE — Assessment & Plan Note (Signed)
 Repeat labs today. Managed with pravastatin  without concern.

## 2024-01-16 LAB — CBC WITH DIFFERENTIAL/PLATELET
Basophils Absolute: 0.1 x10E3/uL (ref 0.0–0.2)
Basos: 1 %
EOS (ABSOLUTE): 0.2 x10E3/uL (ref 0.0–0.4)
Eos: 4 %
Hematocrit: 42 % (ref 34.0–46.6)
Hemoglobin: 13.3 g/dL (ref 11.1–15.9)
Immature Grans (Abs): 0 x10E3/uL (ref 0.0–0.1)
Immature Granulocytes: 0 %
Lymphocytes Absolute: 1.8 x10E3/uL (ref 0.7–3.1)
Lymphs: 33 %
MCH: 30.3 pg (ref 26.6–33.0)
MCHC: 31.7 g/dL (ref 31.5–35.7)
MCV: 96 fL (ref 79–97)
Monocytes Absolute: 0.4 x10E3/uL (ref 0.1–0.9)
Monocytes: 7 %
Neutrophils Absolute: 3 x10E3/uL (ref 1.4–7.0)
Neutrophils: 55 %
Platelets: 311 x10E3/uL (ref 150–450)
RBC: 4.39 x10E6/uL (ref 3.77–5.28)
RDW: 12.2 % (ref 11.7–15.4)
WBC: 5.5 x10E3/uL (ref 3.4–10.8)

## 2024-01-16 LAB — LP+LDL DIRECT
Cholesterol, Total: 172 mg/dL (ref 100–199)
HDL: 51 mg/dL (ref >39)
LDL Chol Calc (NIH): 102 mg/dL — AB (ref 0–99)
LDL Direct: 101 mg/dL — AB (ref 0–99)
Triglycerides: 106 mg/dL (ref 0–149)
VLDL Cholesterol Cal: 19 mg/dL (ref 5–40)

## 2024-01-16 LAB — COMPREHENSIVE METABOLIC PANEL WITH GFR
ALT: 10 IU/L (ref 0–32)
AST: 11 IU/L (ref 0–40)
Albumin: 4.2 g/dL (ref 3.7–4.7)
Alkaline Phosphatase: 84 IU/L (ref 44–121)
BUN/Creatinine Ratio: 23 (ref 12–28)
BUN: 24 mg/dL (ref 8–27)
Bilirubin Total: 0.5 mg/dL (ref 0.0–1.2)
CO2: 22 mmol/L (ref 20–29)
Calcium: 9.5 mg/dL (ref 8.7–10.3)
Chloride: 105 mmol/L (ref 96–106)
Creatinine, Ser: 1.06 mg/dL — AB (ref 0.57–1.00)
Globulin, Total: 2.6 g/dL (ref 1.5–4.5)
Glucose: 106 mg/dL — AB (ref 70–99)
Potassium: 4.5 mmol/L (ref 3.5–5.2)
Sodium: 145 mmol/L — ABNORMAL HIGH (ref 134–144)
Total Protein: 6.8 g/dL (ref 6.0–8.5)
eGFR: 51 mL/min/1.73 — AB (ref >59)

## 2024-01-16 LAB — VITAMIN D 25 HYDROXY (VIT D DEFICIENCY, FRACTURES): Vit D, 25-Hydroxy: 30.4 ng/mL (ref 30.0–100.0)

## 2024-01-21 ENCOUNTER — Ambulatory Visit: Payer: Self-pay | Admitting: Nurse Practitioner

## 2024-03-17 ENCOUNTER — Other Ambulatory Visit (HOSPITAL_BASED_OUTPATIENT_CLINIC_OR_DEPARTMENT_OTHER): Payer: Self-pay

## 2024-03-17 MED ORDER — FLUZONE HIGH-DOSE 0.5 ML IM SUSY
0.5000 mL | PREFILLED_SYRINGE | Freq: Once | INTRAMUSCULAR | 0 refills | Status: AC
  Filled 2024-03-17: qty 0.5, 1d supply, fill #0

## 2024-07-23 ENCOUNTER — Emergency Department (HOSPITAL_COMMUNITY)

## 2024-07-23 ENCOUNTER — Encounter (HOSPITAL_BASED_OUTPATIENT_CLINIC_OR_DEPARTMENT_OTHER): Payer: Self-pay

## 2024-07-23 ENCOUNTER — Emergency Department (HOSPITAL_BASED_OUTPATIENT_CLINIC_OR_DEPARTMENT_OTHER)
Admission: EM | Admit: 2024-07-23 | Discharge: 2024-07-23 | Disposition: A | Discharge status: Home / Self Care | Source: Home / Self Care | Attending: Emergency Medicine | Admitting: Emergency Medicine

## 2024-07-23 ENCOUNTER — Other Ambulatory Visit: Payer: Self-pay

## 2024-07-23 DIAGNOSIS — R200 Anesthesia of skin: Secondary | ICD-10-CM

## 2024-07-23 DIAGNOSIS — M542 Cervicalgia: Secondary | ICD-10-CM

## 2024-07-23 LAB — CBC WITH DIFFERENTIAL/PLATELET
Abs Immature Granulocytes: 0.03 10*3/uL (ref 0.00–0.07)
Basophils Absolute: 0.1 10*3/uL (ref 0.0–0.1)
Basophils Relative: 1 %
Eosinophils Absolute: 0.2 10*3/uL (ref 0.0–0.5)
Eosinophils Relative: 3 %
HCT: 43.8 % (ref 36.0–46.0)
Hemoglobin: 14.2 g/dL (ref 12.0–15.0)
Immature Granulocytes: 1 %
Lymphocytes Relative: 32 %
Lymphs Abs: 2 10*3/uL (ref 0.7–4.0)
MCH: 30.5 pg (ref 26.0–34.0)
MCHC: 32.4 g/dL (ref 30.0–36.0)
MCV: 94 fL (ref 80.0–100.0)
Monocytes Absolute: 0.5 10*3/uL (ref 0.1–1.0)
Monocytes Relative: 8 %
Neutro Abs: 3.6 10*3/uL (ref 1.7–7.7)
Neutrophils Relative %: 55 %
Platelets: 270 10*3/uL (ref 150–400)
RBC: 4.66 MIL/uL (ref 3.87–5.11)
RDW: 12.9 % (ref 11.5–15.5)
WBC: 6.4 10*3/uL (ref 4.0–10.5)
nRBC: 0 % (ref 0.0–0.2)

## 2024-07-23 LAB — COMPREHENSIVE METABOLIC PANEL WITH GFR
ALT: 18 U/L (ref 0–44)
AST: 24 U/L (ref 15–41)
Albumin: 4.3 g/dL (ref 3.5–5.0)
Alkaline Phosphatase: 83 U/L (ref 38–126)
Anion gap: 11 (ref 5–15)
BUN: 21 mg/dL (ref 8–23)
CO2: 25 mmol/L (ref 22–32)
Calcium: 9.3 mg/dL (ref 8.9–10.3)
Chloride: 105 mmol/L (ref 98–111)
Creatinine, Ser: 1.05 mg/dL — ABNORMAL HIGH (ref 0.44–1.00)
GFR, Estimated: 51 mL/min — ABNORMAL LOW
Glucose, Bld: 96 mg/dL (ref 70–99)
Potassium: 4 mmol/L (ref 3.5–5.1)
Sodium: 141 mmol/L (ref 135–145)
Total Bilirubin: 0.7 mg/dL (ref 0.0–1.2)
Total Protein: 7.4 g/dL (ref 6.5–8.1)

## 2024-07-23 LAB — TROPONIN T, HIGH SENSITIVITY: Troponin T High Sensitivity: 6 ng/L (ref 0–19)

## 2024-07-23 LAB — MAGNESIUM: Magnesium: 2.3 mg/dL (ref 1.7–2.4)

## 2024-07-23 MED ORDER — VALACYCLOVIR HCL 500 MG PO TABS
1000.0000 mg | ORAL_TABLET | Freq: Once | ORAL | Status: AC
  Administered 2024-07-23: 1000 mg via ORAL
  Filled 2024-07-23: qty 2

## 2024-07-23 MED ORDER — PREDNISONE 20 MG PO TABS
60.0000 mg | ORAL_TABLET | ORAL | Status: AC
  Administered 2024-07-23: 60 mg via ORAL
  Filled 2024-07-23: qty 3

## 2024-07-23 MED ORDER — PREDNISONE 20 MG PO TABS: 40.0000 mg | ORAL_TABLET | Freq: Every day | ORAL | 0 refills | Status: AC

## 2024-07-23 MED ORDER — VALACYCLOVIR HCL 1 G PO TABS: 1000.0000 mg | ORAL_TABLET | Freq: Three times a day (TID) | ORAL | 0 refills | Status: AC

## 2024-07-23 MED ORDER — GADOBUTROL 1 MMOL/ML IV SOLN
8.5000 mL | Freq: Once | INTRAVENOUS | Status: AC | PRN
  Administered 2024-07-23: 8.5 mL via INTRAVENOUS

## 2024-07-23 NOTE — ED Notes (Signed)
 Attempted 2nd trop with no success.

## 2024-07-23 NOTE — ED Provider Notes (Signed)
 " [facility] Provider Note   CSN: [id number] Arrival date & time: 07/23/24  9178     Patient presents with: Neck Pain   Tahirih Lair Kinney is a 89 y.o. female.    Neck Pain  89 yo female with h/o HTN, remote stroke/TIA with no residual deficit HLD, CKD, lives alone presents with right sided neck pain to head for past two days. Also numbness of right side of mouth and subtle facial droop noted by daughter who is an ICU nurse. No recent falls or trauma. No fevers. No change in vision or speech. Walks with cane as needed but daughter noted walking more slowly this morning. No cp/sob.     Prior to Admission medications  Medication Sig Start Date End Date Taking? Authorizing Provider  albuterol  (VENTOLIN  HFA) 108 (90 Base) MCG/ACT inhaler Inhale 1-2 puffs into the lungs every 4 (four) hours as needed for wheezing or shortness of breath. 04/19/23   Early, Sara E, NP  allopurinol  (ZYLOPRIM ) 100 MG tablet Take 1 tablet (100 mg total) by mouth daily. 01/15/24   Early, Sara E, NP  ALPRAZolam  (XANAX ) 0.25 MG tablet Take 1 tablet (0.25 mg total) by mouth 2 (two) times daily as needed. 01/15/24   Early, Sara E, NP  aspirin  81 MG EC tablet aspirin  81 mg tablet,delayed release  TAKE 1 TABLET BY MOUTH ONCE DAILY SWALLOW WHOLE 08/23/21   Early, Sara E, NP  Cholecalciferol (VITAMIN D  PO) Take 2,000 Units by mouth daily.     [provider]  colchicine  0.6 MG tablet Take 1 tablet (0.6 mg total) by mouth daily as needed (gout). 01/16/21   Janit Thresa HERO, DPM  dapagliflozin  propanediol (FARXIGA ) 5 MG TABS tablet Take 1 tablet (5 mg total) by mouth daily. 01/15/24   Early, Sara E, NP  hydrocortisone 2.5 % cream     [provider]  losartan  (COZAAR ) 25 MG tablet Take 1 tablet (25 mg total) by mouth daily. 01/15/24   Early, Sara E, NP  Magnesium  250 MG TABS Take 250 mg by mouth.     [provider]  meloxicam  (MOBIC ) 7.5 MG tablet Take 1  tablet (7.5 mg total) by mouth daily as needed for pain. 07/26/23   Early, Sara E, NP  Niacinamide  4 % CREA Apply to scars twice a day to help with fading of darkened skin from scars. 01/15/24   Early, Sara E, NP  pantoprazole  (PROTONIX ) 40 MG tablet Take 1 tablet by mouth once daily 10/21/23   Early, Sara E, NP  pravastatin  (PRAVACHOL ) 40 MG tablet Take 1 tablet (40 mg total) by mouth every evening. 07/26/23   Early, Sara E, NP  traMADol  (ULTRAM ) 50 MG tablet Take 1 tablet (50 mg total) by mouth every 6 (six) hours as needed. 07/19/23   Early, Sara E, NP  triamcinolone  (KENALOG ) 0.025 % ointment Apply topically. 03/14/21   [provider]    Allergies: Codeine    Review of Systems  Musculoskeletal:  Positive for neck pain.    Updated Vital Signs BP (!) 154/84   Pulse 63   Temp 98.4 F (36.9 C) (Oral)   Resp 18   SpO2 97%   Physical Exam Vitals and nursing note reviewed.  Constitutional:      General: She is not in acute distress.    Appearance: She is well-developed.  HENT:     Head: Normocephalic and atraumatic.  Eyes:     Conjunctiva/sclera:  Conjunctivae normal.  Neck:     Comments: Pain with range of motion.  Tenderness to right cervical paraspinal muscles with no bony point tenderness, redness, swelling, warmth.  No bruit. Cardiovascular:     Rate and Rhythm: Normal rate and regular rhythm.     Heart sounds: No murmur heard. Pulmonary:     Effort: Pulmonary effort is normal. No respiratory distress.     Breath sounds: Normal breath sounds.  Abdominal:     Palpations: Abdomen is soft.     Tenderness: There is no abdominal tenderness.  Musculoskeletal:        General: No swelling.     Cervical back: Neck supple.  Skin:    General: Skin is warm and dry.     Capillary Refill: Capillary refill takes less than 2 seconds.  Neurological:     Mental Status: She is alert.     Comments: Reduced sensation right side of mouth  Psychiatric:        Mood and Affect: Mood  normal.     (all labs ordered are listed, but only abnormal results are displayed) Labs Reviewed  COMPREHENSIVE METABOLIC PANEL WITH GFR  MAGNESIUM   CBC WITH DIFFERENTIAL/PLATELET  TROPONIN T, HIGH SENSITIVITY    EKG: None  Radiology: No results found.   SABRA[facility]ound ED Peripheral IV (Provider)  Date/Time: 07/23/2024 10:00 AM  Performed by: Jerremy Maione Hima, MD Authorized by: Fredia Rosette Kirsch, MD   Procedure details:    Indications: multiple failed IV attempts     Skin Prep: chlorhexidine  gluconate     Location:  Left AC   Angiocath:  18 G   Bedside Ultrasound Guided: Yes     Images: not archived     Patient tolerated procedure without complications: Yes     Dressing applied: Yes      Medications Ordered in the ED - No data to display                                  Medical Decision Making Amount and/or Complexity of Data Reviewed Labs: ordered. Radiology: ordered.   89 year old female presents with neck pain, head pain, and decreased sensation to light touch on right cheek.  Has a history of a stroke in the past with return to baseline.  Multiple risk factors.  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  States her mother is not seeming at her baseline.  I ordered MRI/MRI of head and neck but we do not have that available here today at [facility] so we will plan to transfer to [facility] per daughter's recommendation.  Patient is alert and ambulatory with normal vital signs and NIH stroke scale of 1, onset 2 days ago so outside of window for acute intervention and stable to transport by private vehicle.  Excepted to [facility], ED by Dr. Dreama     Final diagnoses:  Neck pain  Right facial numbness    ED Discharge Orders     None          Fredia Rosette Kirsch, MD 07/23/24 1003  "

## 2024-07-23 NOTE — ED Triage Notes (Signed)
 Pt c.o right sided neck pain that started last night. Pt is unable to move neck due to pain. Denies injury.  Pt endorsing right sided face tingling that has been ongoing for a couple days. Hx of TIA.

## 2024-07-23 NOTE — ED Notes (Signed)
 Attempted to call report to [facility] twice, unable to get through.

## 2024-07-23 NOTE — ED Provider Notes (Signed)
 1:14 PM Patient seen after MRI results are available after she was initially transferred here from our affiliated facility with concern for right facial paresthesia, neck pain.  MRI, MRA, no acute abnormality, no occlusion, moderate stenosis P2. On exam patient has a left cold sore that is new, and no overt facial asymmetry, speech difficulty, extremity weakness currently.  With possibility for viral etiology, Bell's palsy like phenomena given the reassuring MRI patient will start antivirals, steroids, notes that she can follow-up with her physician patient accompanied by her daughter who is an ICU nurse.  Return precautions also discussed.   Garrick [place], MD 07/23/24 1315

## 2024-07-23 NOTE — Discharge Instructions (Addendum)
 You were seen in the emergency department for right-sided neck pain and right facial numbness.  Your MRI, MRA was reassuring and with your cold sore there is some consideration this may be a viral process contributing to facial numbness as well as neck soreness.  In addition to your prescribed antiviral medication, and steroids, please consider using over-the-counter Voltaren cream for your neck soreness.  Return here for concerning changes in your condition or be sure to follow-up with your physician.

## 2024-07-23 NOTE — ED Triage Notes (Signed)
 Pt came in via POV from [facility]  for MRI d/t recent HA with neck pain that began this morning, Rates her pain 8/10, denies any n/v/d or vision changes, A/Ox4.

## 2024-07-27 ENCOUNTER — Inpatient Hospital Stay: Admitting: Family Medicine

## 2024-08-25 ENCOUNTER — Encounter: Payer: Self-pay | Admitting: Nurse Practitioner

## 2024-10-06 ENCOUNTER — Ambulatory Visit: Payer: Self-pay
